# Patient Record
Sex: Female | Born: 1960 | Race: Black or African American | Hispanic: No | Smoking: Former smoker
Health system: Southern US, Community
[De-identification: ages and names within clinical notes are randomized; demographics above are authoritative.]

## PROBLEM LIST (undated history)

## (undated) DIAGNOSIS — R609 Edema, unspecified: Secondary | ICD-10-CM

## (undated) DIAGNOSIS — I10 Essential (primary) hypertension: Secondary | ICD-10-CM

## (undated) DIAGNOSIS — T7840XA Allergy, unspecified, initial encounter: Secondary | ICD-10-CM

## (undated) DIAGNOSIS — K219 Gastro-esophageal reflux disease without esophagitis: Secondary | ICD-10-CM

## (undated) DIAGNOSIS — D649 Anemia, unspecified: Secondary | ICD-10-CM

## (undated) DIAGNOSIS — L309 Dermatitis, unspecified: Secondary | ICD-10-CM

## (undated) HISTORY — DX: Essential (primary) hypertension: I10

## (undated) HISTORY — PX: TUBAL LIGATION: SHX77

## (undated) HISTORY — DX: Anemia, unspecified: D64.9

## (undated) HISTORY — DX: Edema, unspecified: R60.9

## (undated) HISTORY — DX: Dermatitis, unspecified: L30.9

## (undated) HISTORY — DX: Allergy, unspecified, initial encounter: T78.40XA

## (undated) HISTORY — DX: Gastro-esophageal reflux disease without esophagitis: K21.9

---

## 1997-02-09 ENCOUNTER — Inpatient Hospital Stay (HOSPITAL_COMMUNITY): Admission: AD | Admit: 1997-02-09 | Discharge: 1997-02-09 | Payer: Self-pay | Admitting: *Deleted

## 1997-03-17 ENCOUNTER — Ambulatory Visit (HOSPITAL_COMMUNITY): Admission: RE | Admit: 1997-03-17 | Discharge: 1997-03-17 | Payer: Self-pay | Admitting: *Deleted

## 1997-03-30 ENCOUNTER — Encounter (HOSPITAL_COMMUNITY): Admission: RE | Admit: 1997-03-30 | Discharge: 1997-05-20 | Payer: Self-pay | Admitting: *Deleted

## 1997-04-13 ENCOUNTER — Encounter: Admission: RE | Admit: 1997-04-13 | Discharge: 1997-04-13 | Payer: Self-pay | Admitting: Family Medicine

## 1997-04-29 ENCOUNTER — Encounter: Admission: RE | Admit: 1997-04-29 | Discharge: 1997-04-29 | Payer: Self-pay | Admitting: Sports Medicine

## 1997-05-13 ENCOUNTER — Encounter: Admission: RE | Admit: 1997-05-13 | Discharge: 1997-05-13 | Payer: Self-pay | Admitting: Family Medicine

## 1997-05-17 ENCOUNTER — Inpatient Hospital Stay (HOSPITAL_COMMUNITY): Admission: RE | Admit: 1997-05-17 | Discharge: 1997-05-17 | Payer: Self-pay | Admitting: Obstetrics & Gynecology

## 1997-05-18 ENCOUNTER — Inpatient Hospital Stay (HOSPITAL_COMMUNITY): Admission: AD | Admit: 1997-05-18 | Discharge: 1997-05-22 | Payer: Self-pay | Admitting: *Deleted

## 1997-05-25 ENCOUNTER — Inpatient Hospital Stay (HOSPITAL_COMMUNITY): Admission: AD | Admit: 1997-05-25 | Discharge: 1997-05-25 | Payer: Self-pay | Admitting: Obstetrics

## 1997-05-27 ENCOUNTER — Inpatient Hospital Stay (HOSPITAL_COMMUNITY): Admission: RE | Admit: 1997-05-27 | Discharge: 1997-05-27 | Payer: Self-pay | Admitting: Obstetrics & Gynecology

## 1997-07-02 ENCOUNTER — Encounter: Admission: RE | Admit: 1997-07-02 | Discharge: 1997-07-02 | Payer: Self-pay | Admitting: Family Medicine

## 1997-07-25 ENCOUNTER — Ambulatory Visit (HOSPITAL_COMMUNITY): Admission: RE | Admit: 1997-07-25 | Discharge: 1997-07-25 | Payer: Self-pay | Admitting: Emergency Medicine

## 1997-07-25 ENCOUNTER — Emergency Department (HOSPITAL_COMMUNITY): Admission: EM | Admit: 1997-07-25 | Discharge: 1997-07-25 | Payer: Self-pay | Admitting: Emergency Medicine

## 1998-10-06 ENCOUNTER — Other Ambulatory Visit: Admission: RE | Admit: 1998-10-06 | Discharge: 1998-10-06 | Payer: Self-pay | Admitting: Internal Medicine

## 1999-01-19 ENCOUNTER — Encounter: Payer: Self-pay | Admitting: Internal Medicine

## 1999-01-19 ENCOUNTER — Encounter: Admission: RE | Admit: 1999-01-19 | Discharge: 1999-01-19 | Payer: Self-pay | Admitting: Internal Medicine

## 1999-08-20 ENCOUNTER — Emergency Department (HOSPITAL_COMMUNITY): Admission: EM | Admit: 1999-08-20 | Discharge: 1999-08-20 | Payer: Self-pay | Admitting: Emergency Medicine

## 2000-05-12 ENCOUNTER — Encounter: Payer: Self-pay | Admitting: Emergency Medicine

## 2000-05-12 ENCOUNTER — Emergency Department (HOSPITAL_COMMUNITY): Admission: EM | Admit: 2000-05-12 | Discharge: 2000-05-12 | Payer: Self-pay | Admitting: Emergency Medicine

## 2000-09-19 ENCOUNTER — Encounter: Admission: RE | Admit: 2000-09-19 | Discharge: 2000-09-19 | Payer: Self-pay | Admitting: Internal Medicine

## 2000-09-19 ENCOUNTER — Encounter: Payer: Self-pay | Admitting: Internal Medicine

## 2000-09-26 ENCOUNTER — Other Ambulatory Visit: Admission: RE | Admit: 2000-09-26 | Discharge: 2000-09-26 | Payer: Self-pay | Admitting: Internal Medicine

## 2001-10-07 ENCOUNTER — Encounter: Admission: RE | Admit: 2001-10-07 | Discharge: 2001-10-07 | Payer: Self-pay | Admitting: Internal Medicine

## 2001-10-07 ENCOUNTER — Encounter: Payer: Self-pay | Admitting: Internal Medicine

## 2002-01-29 ENCOUNTER — Emergency Department (HOSPITAL_COMMUNITY): Admission: EM | Admit: 2002-01-29 | Discharge: 2002-01-29 | Payer: Self-pay | Admitting: Emergency Medicine

## 2002-01-29 ENCOUNTER — Encounter: Payer: Self-pay | Admitting: Emergency Medicine

## 2002-05-04 ENCOUNTER — Encounter: Payer: Self-pay | Admitting: *Deleted

## 2002-05-04 ENCOUNTER — Inpatient Hospital Stay (HOSPITAL_COMMUNITY): Admission: EM | Admit: 2002-05-04 | Discharge: 2002-05-05 | Payer: Self-pay | Admitting: Emergency Medicine

## 2002-05-19 ENCOUNTER — Ambulatory Visit (HOSPITAL_COMMUNITY): Admission: RE | Admit: 2002-05-19 | Discharge: 2002-05-19 | Payer: Self-pay | Admitting: *Deleted

## 2002-10-13 ENCOUNTER — Encounter: Admission: RE | Admit: 2002-10-13 | Discharge: 2002-10-13 | Payer: Self-pay | Admitting: Internal Medicine

## 2002-10-13 ENCOUNTER — Encounter: Payer: Self-pay | Admitting: Internal Medicine

## 2002-11-03 ENCOUNTER — Emergency Department (HOSPITAL_COMMUNITY): Admission: EM | Admit: 2002-11-03 | Discharge: 2002-11-03 | Payer: Self-pay | Admitting: Emergency Medicine

## 2003-11-05 ENCOUNTER — Ambulatory Visit (HOSPITAL_COMMUNITY): Admission: RE | Admit: 2003-11-05 | Discharge: 2003-11-05 | Payer: Self-pay | Admitting: Surgery

## 2004-07-28 ENCOUNTER — Ambulatory Visit (HOSPITAL_COMMUNITY): Admission: RE | Admit: 2004-07-28 | Discharge: 2004-07-28 | Payer: Self-pay | Admitting: *Deleted

## 2004-07-28 ENCOUNTER — Ambulatory Visit (HOSPITAL_BASED_OUTPATIENT_CLINIC_OR_DEPARTMENT_OTHER): Admission: RE | Admit: 2004-07-28 | Discharge: 2004-07-28 | Payer: Self-pay | Admitting: *Deleted

## 2005-01-18 ENCOUNTER — Ambulatory Visit (HOSPITAL_COMMUNITY): Admission: RE | Admit: 2005-01-18 | Discharge: 2005-01-18 | Payer: Self-pay | Admitting: Internal Medicine

## 2006-06-03 ENCOUNTER — Ambulatory Visit (HOSPITAL_COMMUNITY): Admission: RE | Admit: 2006-06-03 | Discharge: 2006-06-03 | Payer: Self-pay | Admitting: Orthopaedic Surgery

## 2006-07-03 ENCOUNTER — Ambulatory Visit (HOSPITAL_COMMUNITY): Admission: RE | Admit: 2006-07-03 | Discharge: 2006-07-03 | Payer: Self-pay | Admitting: Internal Medicine

## 2006-07-26 ENCOUNTER — Other Ambulatory Visit: Admission: RE | Admit: 2006-07-26 | Discharge: 2006-07-26 | Payer: Self-pay | Admitting: Internal Medicine

## 2006-09-06 ENCOUNTER — Encounter: Admission: RE | Admit: 2006-09-06 | Discharge: 2006-09-06 | Payer: Self-pay | Admitting: Internal Medicine

## 2007-01-08 ENCOUNTER — Encounter: Admission: RE | Admit: 2007-01-08 | Discharge: 2007-01-08 | Payer: Self-pay | Admitting: Internal Medicine

## 2007-12-31 ENCOUNTER — Ambulatory Visit (HOSPITAL_COMMUNITY): Admission: RE | Admit: 2007-12-31 | Discharge: 2007-12-31 | Payer: Self-pay | Admitting: Internal Medicine

## 2008-02-19 ENCOUNTER — Other Ambulatory Visit: Admission: RE | Admit: 2008-02-19 | Discharge: 2008-02-19 | Payer: Self-pay | Admitting: Internal Medicine

## 2008-02-19 ENCOUNTER — Ambulatory Visit: Payer: Self-pay | Admitting: Internal Medicine

## 2008-05-20 ENCOUNTER — Ambulatory Visit: Payer: Self-pay | Admitting: Internal Medicine

## 2008-07-01 ENCOUNTER — Ambulatory Visit: Payer: Self-pay | Admitting: Internal Medicine

## 2008-07-30 ENCOUNTER — Ambulatory Visit: Payer: Self-pay | Admitting: Internal Medicine

## 2008-09-16 ENCOUNTER — Ambulatory Visit: Payer: Self-pay | Admitting: Internal Medicine

## 2008-11-11 ENCOUNTER — Ambulatory Visit: Payer: Self-pay | Admitting: Internal Medicine

## 2009-01-19 ENCOUNTER — Ambulatory Visit (HOSPITAL_COMMUNITY): Admission: RE | Admit: 2009-01-19 | Discharge: 2009-01-19 | Payer: Self-pay | Admitting: Internal Medicine

## 2009-05-12 ENCOUNTER — Ambulatory Visit: Payer: Self-pay | Admitting: Internal Medicine

## 2010-01-12 ENCOUNTER — Ambulatory Visit
Admission: RE | Admit: 2010-01-12 | Discharge: 2010-01-12 | Payer: Self-pay | Source: Home / Self Care | Attending: Internal Medicine | Admitting: Internal Medicine

## 2010-01-12 ENCOUNTER — Other Ambulatory Visit
Admission: RE | Admit: 2010-01-12 | Discharge: 2010-01-12 | Payer: Self-pay | Source: Home / Self Care | Admitting: Internal Medicine

## 2010-01-21 ENCOUNTER — Encounter: Payer: Self-pay | Admitting: Internal Medicine

## 2010-01-21 ENCOUNTER — Encounter: Payer: Self-pay | Admitting: Surgery

## 2010-01-22 ENCOUNTER — Encounter: Payer: Self-pay | Admitting: Internal Medicine

## 2010-02-09 ENCOUNTER — Other Ambulatory Visit: Payer: Self-pay | Admitting: Internal Medicine

## 2010-02-09 DIAGNOSIS — Z1231 Encounter for screening mammogram for malignant neoplasm of breast: Secondary | ICD-10-CM

## 2010-02-16 ENCOUNTER — Ambulatory Visit (HOSPITAL_COMMUNITY)
Admission: RE | Admit: 2010-02-16 | Discharge: 2010-02-16 | Disposition: A | Discharge status: Home / Self Care | Payer: Commercial Managed Care - PPO | Source: Ambulatory Visit | Attending: Internal Medicine | Admitting: Internal Medicine

## 2010-02-16 DIAGNOSIS — Z1231 Encounter for screening mammogram for malignant neoplasm of breast: Secondary | ICD-10-CM | POA: Insufficient documentation

## 2010-05-16 NOTE — Op Note (Signed)
[name], [name]               ACCOUNT NO.:  [id number]   MEDICAL RECORD NO.:  [id number]          PATIENT TYPE:  AMB   LOCATION:  SDS                          FACILITY:  [facility]   PHYSICIAN:  Vanita Panda. Magnus Ivan, M.D.DATE OF BIRTH:  [birth year]   DATE OF PROCEDURE:  06/03/2006  DATE OF DISCHARGE:                               OPERATIVE REPORT   PREOPERATIVE DIAGNOSIS:  Retained foreign object left thumb (fish barb).   POSTOPERATIVE DIAGNOSIS:  Retained foreign object left thumb (fish  barb).   PROCEDURE:  Left thumb exploration with removal of foreign object (fish  barb).   SURGEON:  Doneen Poisson, MD   ANESTHESIA:  IV sedation and local digital block left thumb with 4%  plain Marcaine.   ANTIBIOTICS:  1 gram IV Ancef.   BLOOD LOSS:  Minimal.   COMPLICATIONS:  None.   INDICATIONS:  Briefly 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  She was seen [facility] and they  tried to remove the barb from her thumb but were not successful.  X-rays  did show a retained foreign object in the thumb.  She was given follow-  up in her office and she understood the need for surgery and the risks,  benefits of this were explained her and well understood and she agreed  to the OR.   PROCEDURE DESCRIPTION:  After informed consent was obtained appropriate  left thumb was marked, she was brought to the operating room, placed  supine on the operating table.  Her hand was prepped and draped with  DuraPrep and sterile drapes.  A time-out was called and she was  identified as correct patient correct extremity.  She was again given  some slight IV sedation but I placed a digital nerve block using 0.25%  plain Marcaine with she tolerated well.  I then opened up the incision  and removed the previous sutures.  Using hemostat I was able to easily  feel the fish barb and  removed it in its entirety and showed it to the  patient who was wide awake and she saw this understand and agreed this  was the barb that broke off.  I then irrigated the wound thoroughly with  normal saline solution.  I closed the incision with interrupted 3-0  nylon suture.  The Xeroform followed by well-padded sterile dressing was  applied.  The thumb remained well-perfused throughout the case.  She was  taken to recovery room in  stable condition.  Postoperatively I will have her follow-up in office  in 2 weeks.  She will change her dressing tomorrow and can use the thumb  as tolerated with daily dressing changes.  She will continue antibiotics  that she is on for two more days and I will have her call if there is  any other questions or concerns. June 03, 2006      Vanita Panda. Magnus Ivan, M.D.  Electronically Signed     CYB/MEDQ  D:  06/03/2006  T:  06/04/2006  Job:  [id number]

## 2010-05-19 NOTE — Cardiovascular Report (Signed)
NAME:  Debra Jacobson, Debra Jacobson                         ACCOUNT NO.:  [id number]   MEDICAL RECORD NO.:  [id number]                   PATIENT TYPE:  OIB   LOCATION:  2899                                 FACILITY:  [facility]   PHYSICIAN:  Richard A. Alanda Amass, M.D.          DATE OF BIRTH:  [birth year]   DATE OF PROCEDURE:  05/19/2002  DATE OF DISCHARGE:  05/19/2002                              CARDIAC CATHETERIZATION   PROCEDURE:  Retrograde central aortic catheterization, selective coronary  angiography via Judkins technique, LV angiogram, RAO/LAO projection,  abdominal angiogram hand injection.   DESCRIPTION OF PROCEDURE:  The patient was brought to second floor CP lab in  a postabsorptive state after 5 mg of Valium p.o. premedication.  Next, was  given  2 mg of Versed and 2 mg of Nubain for sedation IV at the beginning of the  procedure.  1% Xylocaine was used for local anesthesia.  The CRFA was  entered with single atrial puncture using an 18 thin wall needle and a 6  French short __________ sheaths were inserted without difficulty.  Catheterization was performed with 6 French 4-cm tapered preformed coronary  pigtail catheter using Omnipaque dye throughout the procedure.  There was  separate ostia to the LAD and circumflex which were selectively catheterized  with the left coronary catheter.  Right coronary angiography was then done  followed by LV angiogram in the RAO and LAO projection, 25 mL; 14 mL per  second and 20 mL; 12 mL per second.  Pullback pressure of this area was  performed which showed no gradient across the aortic valve.  Hand injections  of abdominal aorta at the level just above the renal artery was done in the  PA projection.  This demonstrated normal widely patent single renal arteries  with normal infrarenal aorta just below on limited injection.  There was no  evidence of renal stenosis or FMD.   Catheters removed.  __________ sheath was flushed.  The patient was  transferred to the holding area for sheath removal with pressure hemostasis.  She tolerated the procedure well.   PRESSURES:  1. LV:  140/0; LVDP 18 mmHg.  2. CA:  140/80-85 mmHg.  3. There was no gradient across the aortic valve on catheter pullback.   Fluoroscopy did not reveal any coronary, intracoronary, valvular  calcification.   LV angiogram demonstrated a normally contracting ventricle with EF greater  than 60%, possible mild angiographic LVH, no mitral valve prolapse or mitral  regurgitation was seen.  No wall motion abnormalities.   The LAD and circumflex had separate ostia.   The LAD was widely patent with a bifurcating moderate-size DX1 in the  proximal before SP1 and the proximal third of the LAD.  There was then a  large septal perforator and then a large DX2 from the junction of the  proximal mid third of the LAD was normal.  The LAD had a loop in its  mid  portion and there may have been some muscular bridging in this area.  However, there was no systolic compression evident with normal filling down  to the apex.   The circumflex had a separate ostium comprised of a small first marginal, a  large second marginal that bifurcated and a PAVG and atrial branch; all of  which were normal.   The right coronary was a large dominant vessel with normal PDA, PLA and RV  branches.  There was no evidence of coronary spasm or atherosclerotic  disease present in either the left or right coronary system.   DISCUSSION:  This 50 year old divorced and remarried mother of three is a  nonsmoker.  She does have a history of prior ETOH.  She works at [employer] and has a history of exogenous obesity with a norma lipid profile.  She was recently admitted to [facility] May 3 to May 4 for atypical chest pain.  Myocardial infarction was ruled out by serial enzymes and EKGs.  She did  have elevated CK, but negative MBs and troponins.  She was hypokalemic which  was repleted during her  hospital stay which may have contributed to her CK  elevation.  She was seen in followup by Dr. Lenise Herald and a Cardiolite  was performed.  This demonstrated moderate diaphragmatic attenuation and  possible anterior wall ischemia.  It was difficult to interpret because of  the patient's exogenous obesity and large breasts.  For this reason,  diagnostic catheterization was recommended.   Fortunately, catheterization shows entirely normal coronary arteries and  left ventricle.  There is some tortuosity of the mid LAD with possible  intramyocardial portion of the mid LAD.  However, there was no systolic  bridging, milking or spasm.   I would recommend reassurance to this patient.  Continue empiric proton pump  inhibitors and therapy of her mild hypertension with normal renal arteries.   Obviously weight reduction is recommended along with an exercise program.   CATHETERIZATION DIAGNOSES:  1. Chest pain, etiology not determined.  False-positive Cardiolite related     to large size.  2. Exogenous obesity.  3. Normal coronary arteries and left ventricle.  Separate ostia, left     anterior descending and circumflex.  4. Mild hypertension.  5. Gastroesophageal reflux disease.                                               Richard A. Alanda Amass, M.D.    RAW/MEDQ  D:  05/19/2002  T:  05/20/2002  Job:  [id number]   cc:   Luanna Cole. Lenord Fellers, M.D.  [address] [place]  [place]  [place] [address]  Fax: [phone]   Record Room   Darlin Priestly, M.D.  [phone] [address], [address]  [place]  [place] [address]  Fax: [phone]

## 2010-05-19 NOTE — Op Note (Signed)
Debra, Jacobson               ACCOUNT NO.:  [id number]   MEDICAL RECORD NO.:  [id number]          PATIENT TYPE:  AMB   LOCATION:  DSC                          FACILITY:  [facility]   PHYSICIAN:  Tennis Must Meyerdierks, M.D.DATE OF BIRTH:  Feb 02, 1960   DATE OF PROCEDURE:  07/28/2004  DATE OF DISCHARGE:                                 OPERATIVE REPORT   PREOPERATIVE DIAGNOSIS:  Lacerated radial digital artery and nerve, left  index finger.   POSTOPERATIVE DIAGNOSIS:  Lacerated radial digital artery and nerve, left  index finger.   PROCEDURE:  Repair of radial digital artery and nerve, left index finger.   SURGEON:  Lowell Bouton, M.D.   ANESTHESIA:  General.   OPERATIVE FINDINGS:  The patient had a complete transection of both the  digital artery and nerve on the radial side of the index finger at the MP  level. The flexor tendons were intact.   PROCEDURE:  Under general anesthesia, with a tourniquet on the left arm, the  left hand was prepped and draped in the usual fashion and after explaining  the lamina tourniquet was inflated to 250 mmHg. The previous sutures were  removed and the wound was spread open bluntly. It was then extended slightly  distally out onto the finger. Flap was elevated and a Alm retractor was  inserted. Blunt dissection was carried down to the nerve.  Both the artery  and nerve were identified. The microscope was then brought into the field  and the nerve was dissected out. Both ends of the nerve were easily  identified and the ends of the nerve were trimmed back to remove any  hemorrhage. After good fascicles were obtained through the microscope, 9-0  nylon suture was used to perform an epineurial repair. Approximately five  sutures were inserted around the nerve. There was good coaptation of the  ends of the nerve.  The ends of the artery were dissected out and Dora Sims  solution was used to dilate the ends of the artery. A vessel dilator was  also used and after appropriate dilatation a small vascular clamp was  applied. The artery was then repaired using a 10-0 nylon suture in an  interrupted fashion. After the arterial repair, the tourniquet was released  with good flow through the vessel. __________ nylon sutures. Sterile  dressings were applied followed by a posterior splint with the MP joint  slightly flexed.  The patient had good circulation her hand. She went to  recovery room awake and stable in good condition. Some 0.5% Marcaine was  inserted in the skin edges for pain control postoperatively.       EMM/MEDQ  D:  07/28/2004  T:  07/28/2004  Job:  [id number]

## 2010-05-19 NOTE — Discharge Summary (Signed)
NAME:  Debra Jacobson, Debra Jacobson                         ACCOUNT NO.:  [id number]   MEDICAL RECORD NO.:  [id number]                   PATIENT TYPE:  INP   LOCATION:  6529                                 FACILITY:  [facility]   PHYSICIAN:  Darlin Priestly, M.D.             DATE OF BIRTH:  June 29, 1960   DATE OF ADMISSION:  05/04/2002  DATE OF DISCHARGE:  05/05/2002                                 DISCHARGE SUMMARY   DISCHARGE DIAGNOSES:  1. Chest pain on admission, ruled out for myocardial infarction by EKG and     negative troponins.  2. Possible underlying coronary artery disease.  Will proceed with     Cardiolite stress test as an outpatient.  3. Gastroesophageal reflux disease.  4. History of ethanol abuse.  5. Obesity with unknown lipid profile.  6. Hypokalemia, repleted.   HISTORY OF PRESENT ILLNESS:  This is a 50 year old African American female  with a history of obesity and positive ethanol use who developed substernal  chest pain on and off for months.  Yesterday, the patient noticed that the  pain became worse and she was unable to obtain a good night's sleep so she  presented to the emergency room where her cardiac enzymes were checked and  the first elevated reading was reported to Dr. Lenord Fellers and she recommended  the patient would be evaluated by [facility]  M.D.   Mr. Jenne Campus assessed the patient and admitted her for overnight observation  and 2-D echo.   MEDICATION AT HOME:  Nexium started.   ALLERGIES:  No known drug allergies.   FAMILY HISTORY:  No significant coronary artery disease in the family.  Mother died of sarcoidosis.  The father has a permanent pacemaker.   SOCIAL HISTORY:  She is married and has three children.  She is employed at  [employer]. [employer].   HABITS:  She does not smoke but does have a history of extensive ethanol use  up to one half liter of liquor daily.   HOSPITAL COURSE:  Her EKG on admission did not  show any significant STT wave  changes.  It showed normal sinus rhythm.   The morning of discharge, she complained of epigastric discomfort and we  checked amylase which was within normal limits.  Her cardiac enzymes were as  follows.  The first set showed CK 656, CK-MB 6.8, troponin 0.02; second set  with CK 615, CK-MB 6.7, troponin 0.01; third set with CK 496, CK-MB 4.9,  troponin 0.01.  Her hemoglobin on the morning of discharge was 12.0.  Potassium was 3.4 and it was repleted prior to discharge.  BUN 14,  creatinine 0.9.  Glucose 105.   Dr. Allyson Sabal assessed the patient prior to discharge and he canceled the 2-D  echo order but decided to proceed with outpatient two day staged Cardiolite  and then follow up as an outpatient wit Dr.  McQueen.   DISCHARGE MEDICATIONS:  1. Metoprolol 12.5 mg one p.o. b.i.d.  2. Multivitamin daily.  3. Folic acid 1 mg one pill daily.  4. Enteric coated aspirin 325 mg daily.  5. Protonix 40 mg daily.   ACTIVITY:  As tolerated without exertion.   DIET:  Avoid spicy food and alcohol.   FOLLOW UP:  On May 13, 2002, and May 14, 2002, the patient will have a two  day protocol stress test at 8:15 in the morning and then Dr. Jenne Campus will  see her on May 15, 2002, at 10:45.  Phone number of our office was provided  and address as well.     Raymon Mutton, P.A.                    Darlin Priestly, M.D.    MK/MEDQ  D:  05/05/2002  T:  05/06/2002  Job:  [id number]   cc:   Luanna Cole. Lenord Fellers, M.D.  [address] [place]  [place]  [place] [address]  Fax: [phone]

## 2010-07-13 ENCOUNTER — Ambulatory Visit: Payer: Commercial Managed Care - PPO | Admitting: Internal Medicine

## 2010-07-14 ENCOUNTER — Ambulatory Visit: Payer: Commercial Managed Care - PPO | Admitting: Internal Medicine

## 2010-08-16 ENCOUNTER — Other Ambulatory Visit: Payer: Self-pay | Admitting: Internal Medicine

## 2010-08-21 ENCOUNTER — Other Ambulatory Visit: Payer: Self-pay | Admitting: Internal Medicine

## 2010-08-28 ENCOUNTER — Ambulatory Visit: Payer: Commercial Managed Care - PPO | Admitting: Internal Medicine

## 2010-08-29 ENCOUNTER — Ambulatory Visit (INDEPENDENT_AMBULATORY_CARE_PROVIDER_SITE_OTHER): Payer: Commercial Managed Care - PPO | Admitting: Internal Medicine

## 2010-08-29 ENCOUNTER — Encounter: Payer: Self-pay | Admitting: Internal Medicine

## 2010-08-29 VITALS — BP 118/72 | HR 78 | Temp 98.2°F | Ht 68.0 in | Wt 292.0 lb

## 2010-08-29 DIAGNOSIS — E669 Obesity, unspecified: Secondary | ICD-10-CM

## 2010-08-29 DIAGNOSIS — R609 Edema, unspecified: Secondary | ICD-10-CM

## 2010-08-29 DIAGNOSIS — I10 Essential (primary) hypertension: Secondary | ICD-10-CM

## 2010-08-30 ENCOUNTER — Encounter: Payer: Self-pay | Admitting: Internal Medicine

## 2010-08-30 NOTE — Progress Notes (Signed)
  Subjective:    Patient ID: Debra Jacobson, female    DOB: 1960-07-24, 50 y.o.   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 No complaints or problems. Is in an unhappy marriage. Husband is disabled but recently told her he was willing to move out of she would give him $200. However he is still living in the home. They have 2 children both of whom are morbidly obese. She is also helping to raise her grandchild because her son from another relationship is in jail for armed robbery. Recently she had a vacation to the [place] and enjoyed herself. In the past has had some issues with alcohol consumption at night.    Review of Systems     Objective:   Physical Exam neck is supple without JVD, thyromegaly or carotid bruits; chest is clear; cardiac exam regular rate and rhythm, normal S1 and S2; extremities without pitting edema although she does have some edema issues treated with Lasix, she does not take the Lasix on a daily basis        Assessment & Plan:  Hypertension  Obesity  Dependent edema  Refill antihypertensive medications for 6 months if pharmacy calls. Book physical examination late winter or spring 2013

## 2010-08-31 ENCOUNTER — Ambulatory Visit: Payer: Commercial Managed Care - PPO | Admitting: Internal Medicine

## 2010-09-18 ENCOUNTER — Other Ambulatory Visit: Payer: Self-pay | Admitting: Internal Medicine

## 2010-10-19 LAB — CBC
HCT: 36.7
Hemoglobin: 12.5
MCHC: 34
MCV: 95.1
Platelets: 216
RBC: 3.86 — ABNORMAL LOW
RDW: 13.6
WBC: 7.9

## 2011-01-02 HISTORY — PX: GASTRIC BYPASS: SHX52

## 2011-02-15 ENCOUNTER — Other Ambulatory Visit: Payer: Self-pay | Admitting: Internal Medicine

## 2011-02-26 ENCOUNTER — Other Ambulatory Visit: Payer: Self-pay | Admitting: Internal Medicine

## 2011-02-26 DIAGNOSIS — Z1231 Encounter for screening mammogram for malignant neoplasm of breast: Secondary | ICD-10-CM

## 2011-02-27 ENCOUNTER — Other Ambulatory Visit: Payer: Commercial Managed Care - PPO | Admitting: Internal Medicine

## 2011-02-28 ENCOUNTER — Other Ambulatory Visit: Payer: Commercial Managed Care - PPO | Admitting: Internal Medicine

## 2011-03-01 ENCOUNTER — Ambulatory Visit (INDEPENDENT_AMBULATORY_CARE_PROVIDER_SITE_OTHER): Payer: Commercial Managed Care - PPO | Admitting: Internal Medicine

## 2011-03-01 ENCOUNTER — Encounter: Payer: Self-pay | Admitting: Internal Medicine

## 2011-03-01 VITALS — BP 110/82 | HR 72 | Ht 66.5 in | Wt 298.0 lb

## 2011-03-01 DIAGNOSIS — E669 Obesity, unspecified: Secondary | ICD-10-CM

## 2011-03-01 DIAGNOSIS — I10 Essential (primary) hypertension: Secondary | ICD-10-CM

## 2011-03-01 DIAGNOSIS — Z0000 Encounter for general adult medical examination without abnormal findings: Secondary | ICD-10-CM

## 2011-03-01 LAB — CBC WITH DIFFERENTIAL/PLATELET
Basophils Absolute: 0 10*3/uL (ref 0.0–0.1)
Basophils Relative: 0 % (ref 0–1)
Eosinophils Absolute: 0.2 10*3/uL (ref 0.0–0.7)
Eosinophils Relative: 3 % (ref 0–5)
HCT: 37.4 % (ref 36.0–46.0)
Hemoglobin: 11.8 g/dL — ABNORMAL LOW (ref 12.0–15.0)
Lymphocytes Relative: 38 % (ref 12–46)
Lymphs Abs: 2.3 10*3/uL (ref 0.7–4.0)
MCH: 31.8 pg (ref 26.0–34.0)
MCHC: 31.6 g/dL (ref 30.0–36.0)
MCV: 100.8 fL — ABNORMAL HIGH (ref 78.0–100.0)
Monocytes Absolute: 0.4 10*3/uL (ref 0.1–1.0)
Monocytes Relative: 7 % (ref 3–12)
Neutro Abs: 3 10*3/uL (ref 1.7–7.7)
Neutrophils Relative %: 52 % (ref 43–77)
Platelets: 159 10*3/uL (ref 150–400)
RBC: 3.71 MIL/uL — ABNORMAL LOW (ref 3.87–5.11)
RDW: 13.6 % (ref 11.5–15.5)
WBC: 5.9 10*3/uL (ref 4.0–10.5)

## 2011-03-01 LAB — LIPID PANEL
Cholesterol: 192 mg/dL (ref 0–200)
HDL: 75 mg/dL (ref >39)
LDL Cholesterol: 100 mg/dL — ABNORMAL HIGH (ref 0–99)
Total CHOL/HDL Ratio: 2.6 Ratio
Triglycerides: 84 mg/dL (ref <150)
VLDL: 17 mg/dL (ref 0–40)

## 2011-03-01 LAB — POCT URINALYSIS DIPSTICK
Bilirubin, UA: NEGATIVE
Blood, UA: NEGATIVE
Glucose, UA: NEGATIVE
Ketones, UA: NEGATIVE
Leukocytes, UA: NEGATIVE
Nitrite, UA: NEGATIVE
Protein, UA: NEGATIVE
Spec Grav, UA: >=1.03
Urobilinogen, UA: NEGATIVE
pH, UA: 5.5

## 2011-03-01 LAB — COMPREHENSIVE METABOLIC PANEL
ALT: 69 U/L — ABNORMAL HIGH (ref 0–35)
AST: 76 U/L — ABNORMAL HIGH (ref 0–37)
Albumin: 3.7 g/dL (ref 3.5–5.2)
Alkaline Phosphatase: 58 U/L (ref 39–117)
BUN: 18 mg/dL (ref 6–23)
CO2: 23 mEq/L (ref 19–32)
Calcium: 8.9 mg/dL (ref 8.4–10.5)
Chloride: 105 mEq/L (ref 96–112)
Creat: 0.9 mg/dL (ref 0.50–1.10)
Glucose, Bld: 88 mg/dL (ref 70–99)
Potassium: 4.3 mEq/L (ref 3.5–5.3)
Sodium: 137 mEq/L (ref 135–145)
Total Bilirubin: 0.4 mg/dL (ref 0.3–1.2)
Total Protein: 7.9 g/dL (ref 6.0–8.3)

## 2011-03-01 LAB — TSH: TSH: 0.658 u[IU]/mL (ref 0.350–4.500)

## 2011-03-01 NOTE — Progress Notes (Signed)
Subjective:    Patient ID: Debra Jacobson, female    DOB: 09/07/1960, 51 y.o.   MRN: [id number]  HPI 51 year old black female with history of hypertension and obesity for health maintenance and evaluation of medical problems. Fasting labs drawn today and are pending. Patient has not had screening colonoscopy. She will consider it. Last Pap smear was 01/13/2010. 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 Had tetanus immunization 2005. No known drug allergies. Had negative cardiac catheterization may 2004. Left index finger laceration 2005.  History of 2 C-sections. Tubal ligation May 1999. Ectopic pregnancy and uterine fibroid surgery 1995.  Patient is married. Husband is disabled. She and husband do not get along very well. They have 2 children. She has a son from a previous relationship that has been in jail. She has one grandchild. Patient drinks alcohol on a daily basis. Does not smoke.  Mother died from complications of sarcoidosis with history of hypertension. 3 sisters& one of them has sarcoidosis. No brothers.  Patient has been a patient here in this practice since 1999. 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 Patient weighed 309 pounds in March 2000. She has had a total of 8 pregnancies& 5 miscarriages.    Review of Systems  Constitutional: Positive for fatigue.  HENT: Negative.   Eyes: Negative.   Cardiovascular: Negative.   Gastrointestinal: Negative.   Genitourinary: Negative.   Musculoskeletal: Positive for back pain.  Neurological: Negative.   Hematological: Negative.   Psychiatric/Behavioral: Negative.        Objective:   Physical Exam  Vitals reviewed. Constitutional: She is oriented to person, place, and time. She appears well-developed and well-nourished. No distress.  HENT:  Head: Normocephalic and atraumatic.  Right Ear: External ear  normal.  Left Ear: External ear normal.  Mouth/Throat: Oropharynx is clear and moist. No oropharyngeal exudate.  Eyes: Conjunctivae and EOM are normal. Pupils are equal, round, and reactive to light. Right eye exhibits no discharge. Left eye exhibits no discharge. No scleral icterus.  Neck: Neck supple. No JVD present. No thyromegaly present.  Cardiovascular: Normal rate, regular rhythm, normal heart sounds and intact distal pulses.   No murmur heard. Pulmonary/Chest: Effort normal and breath sounds normal. She has no wheezes. She has no rales.       Breasts are pendulous without masses or nipple discharges  Abdominal: Soft. Bowel sounds are normal. She exhibits no mass. There is no tenderness. There is no rebound.  Genitourinary:       Last Pap smear 01/13/2010. Bimanual exam is normal.  Musculoskeletal: She exhibits no edema.  Lymphadenopathy:    She has no cervical adenopathy.  Neurological: She is alert and oriented to person, place, and time. She has normal reflexes. She displays normal reflexes. No cranial nerve deficit. Coordination normal.  Skin: Skin is warm and dry. She is not diaphoretic.       Bilateral tinea pedis  Psychiatric: She has a normal mood and affect. Her behavior is normal. Judgment and thought content normal.          Assessment & Plan:  Hypertension  Morbid obesity  History of GE reflux  Allergic rhinitis  History of dependent edema  Tinea pedis  Plan: Patient may use over-the-counter antifungal cream for tinea pedis. She is considering having bariatric surgery at [facility] in the near future. Continue same antihypertensive medication Norvasc and metoprolol as  well as Lasix. Continue one aspirin daily. Return in 6 months for office visit blood pressure check. Patient to give consideration to colonoscopy.

## 2011-03-01 NOTE — Patient Instructions (Signed)
Continue same antihypertensive medications. Return in 6 months for office visit blood pressure check.

## 2011-03-02 LAB — VITAMIN D 25 HYDROXY (VIT D DEFICIENCY, FRACTURES): Vit D, 25-Hydroxy: 45 ng/mL (ref 30–89)

## 2011-03-15 ENCOUNTER — Other Ambulatory Visit: Payer: Self-pay | Admitting: Internal Medicine

## 2011-03-27 ENCOUNTER — Ambulatory Visit (HOSPITAL_COMMUNITY)
Admission: RE | Admit: 2011-03-27 | Discharge: 2011-03-27 | Disposition: A | Discharge status: Home / Self Care | Payer: Commercial Managed Care - PPO | Source: Ambulatory Visit | Attending: Internal Medicine | Admitting: Internal Medicine

## 2011-03-27 DIAGNOSIS — Z1231 Encounter for screening mammogram for malignant neoplasm of breast: Secondary | ICD-10-CM | POA: Insufficient documentation

## 2011-03-29 ENCOUNTER — Telehealth: Payer: Self-pay | Admitting: Internal Medicine

## 2011-03-29 NOTE — Telephone Encounter (Signed)
I will call patient

## 2011-03-29 NOTE — Telephone Encounter (Signed)
Patient called yesterday and said that on her physical exam February 28 said I stated she drank alcohol on a daily basis. She told front office staff she has not had anything to drink now for over a month. That would be about the time of her physical exam. I called [facility] and spoke with Sherron Monday today who is the Bariatric Coordinator. It seems that patient was scheduled to have bariatric surgery on April 2 and it was canceled because Dr. Adolphus Birchwood, surgeon wanted patient to see psychologist prior to undergoing surgery. She has been in a hurry to get surgery done. Apparently she has told coordinator that her children are out of school next week. She has been rescheduled to see surgeon April 8. She is seeing psychologist today at noon. Ms. Ninetta Lights recommends that we have patient come back in and discuss these issues. She apparently did not want to go to a seminar prior to surgery but Dr. Adolphus Birchwood insisted she attend which she did.. According to bariatric coordinator, She has been pushy in terms of getting surgery scheduled and a bit passive-aggressive about filling out required paperwork. A psychology evaluation is probably a good idea at this point. Bariatric Coordinator indicates that consuming alcohol is of concern to surgeon and something that they do not take lightly and generally send patient is to be evaluated for.

## 2011-05-07 ENCOUNTER — Encounter: Payer: Self-pay | Admitting: Internal Medicine

## 2011-05-07 ENCOUNTER — Ambulatory Visit (INDEPENDENT_AMBULATORY_CARE_PROVIDER_SITE_OTHER): Payer: Commercial Managed Care - PPO | Admitting: Internal Medicine

## 2011-05-07 VITALS — BP 104/76 | HR 68 | Temp 98.0°F | Ht 66.75 in | Wt 282.0 lb

## 2011-05-07 DIAGNOSIS — Z9884 Bariatric surgery status: Secondary | ICD-10-CM

## 2011-05-07 DIAGNOSIS — K219 Gastro-esophageal reflux disease without esophagitis: Secondary | ICD-10-CM

## 2011-05-07 DIAGNOSIS — Z8639 Personal history of other endocrine, nutritional and metabolic disease: Secondary | ICD-10-CM

## 2011-05-07 DIAGNOSIS — I10 Essential (primary) hypertension: Secondary | ICD-10-CM

## 2011-05-07 DIAGNOSIS — E669 Obesity, unspecified: Secondary | ICD-10-CM

## 2011-05-07 LAB — COMPREHENSIVE METABOLIC PANEL
AST: 34 U/L (ref 0–37)
Albumin: 3.7 g/dL (ref 3.5–5.2)
Alkaline Phosphatase: 42 U/L (ref 39–117)
BUN: 12 mg/dL (ref 6–23)
Potassium: 3.9 mEq/L (ref 3.5–5.3)

## 2011-05-07 LAB — CBC WITH DIFFERENTIAL/PLATELET
Basophils Absolute: 0 10*3/uL (ref 0.0–0.1)
Basophils Relative: 0 % (ref 0–1)
MCHC: 30.8 g/dL (ref 30.0–36.0)
Monocytes Absolute: 0.4 10*3/uL (ref 0.1–1.0)
Neutro Abs: 2.1 10*3/uL (ref 1.7–7.7)
Neutrophils Relative %: 47 % (ref 43–77)
RDW: 14.3 % (ref 11.5–15.5)

## 2011-05-07 NOTE — Patient Instructions (Signed)
Continue to monitor your blood pressure off antihypertensive medication. Return in 4 months. Continue vitamin supplements as prescribed by surgeon.

## 2011-05-07 NOTE — Progress Notes (Signed)
  Subjective:    Patient ID: Debra Jacobson, female    DOB: Dec 12, 1960, 51 y.o.   MRN: [id number]  HPI patient is status post bariatric surgery at [facility] by Dr. Adolphus Birchwood. Surgery was done April 16. She did well postoperatively. 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 The operation that she had was sleeve gastric bypass. Patient had long-standing history of obesity. In March 2013 she weighed 298 pounds. She has a history of hypertension GE reflux and dependent edema. Had been maintained on metoprolol, furosemide, amlodipine and vitamin D. She is now off all antihypertensive medications. Blood pressure is excellent at the present time.  No known drug allergies  C-section x2, history of bilateral tubal ligation, history of surgery for ectopic pregnancy and uterine fibroids 1995.  Negative cardiac catheterization after admission for chest pain may 2004.  Tetanus immunization 2005.  History of vitamin D deficiency.  Currently taking Actonel, Pepto-Bismol, multivitamin and TUMS.    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 Adolphus Birchwood. We have drawn today a CBC, C. met and TSH which will be sent to Dr. Adolphus Birchwood. Return in 4-6 months. Stay off antihypertensive medication but continue to monitor blood pressure at home

## 2011-05-26 ENCOUNTER — Other Ambulatory Visit: Payer: Self-pay | Admitting: Internal Medicine

## 2011-05-30 ENCOUNTER — Encounter: Payer: Self-pay | Admitting: Internal Medicine

## 2011-05-30 DIAGNOSIS — Z9884 Bariatric surgery status: Secondary | ICD-10-CM | POA: Insufficient documentation

## 2011-08-30 ENCOUNTER — Ambulatory Visit: Payer: Commercial Managed Care - PPO | Admitting: Internal Medicine

## 2011-09-10 ENCOUNTER — Ambulatory Visit (INDEPENDENT_AMBULATORY_CARE_PROVIDER_SITE_OTHER): Payer: Commercial Managed Care - PPO | Admitting: Internal Medicine

## 2011-09-10 ENCOUNTER — Encounter: Payer: Self-pay | Admitting: Internal Medicine

## 2011-09-10 VITALS — BP 108/80 | HR 58 | Temp 97.9°F | Wt 231.0 lb

## 2011-09-10 DIAGNOSIS — Z9884 Bariatric surgery status: Secondary | ICD-10-CM

## 2011-09-11 LAB — COMPREHENSIVE METABOLIC PANEL
ALT: 19 U/L (ref 0–35)
Calcium: 9.4 mg/dL (ref 8.4–10.5)
Potassium: 4.1 mEq/L (ref 3.5–5.3)
Sodium: 141 mEq/L (ref 135–145)
Total Bilirubin: 0.4 mg/dL (ref 0.3–1.2)

## 2011-09-11 LAB — CBC WITH DIFFERENTIAL/PLATELET
Eosinophils Absolute: 0.1 10*3/uL (ref 0.0–0.7)
Eosinophils Relative: 2 % (ref 0–5)
Hemoglobin: 11 g/dL — ABNORMAL LOW (ref 12.0–15.0)
Lymphs Abs: 2.4 10*3/uL (ref 0.7–4.0)
MCH: 30.8 pg (ref 26.0–34.0)
MCV: 90.8 fL (ref 78.0–100.0)
Monocytes Absolute: 0.4 10*3/uL (ref 0.1–1.0)
Monocytes Relative: 6 % (ref 3–12)
Platelets: 139 10*3/uL — ABNORMAL LOW (ref 150–400)
RBC: 3.57 MIL/uL — ABNORMAL LOW (ref 3.87–5.11)

## 2011-09-11 LAB — HEMOGLOBIN A1C
Hgb A1c MFr Bld: 5.4 %
Mean Plasma Glucose: 108 mg/dL

## 2011-09-11 LAB — IRON AND TIBC
%SAT: 21 % (ref 20–55)
TIBC: 297 ug/dL (ref 250–470)
UIBC: 234 ug/dL (ref 125–400)

## 2011-09-30 NOTE — Patient Instructions (Addendum)
Stay off of antihypertensive medication and return spring 2014

## 2011-09-30 NOTE — Progress Notes (Signed)
  Subjective:    Patient ID: Debra Jacobson, female    DOB: 06-22-1960, 51 y.o.   MRN: [id number]  HPI 51 year old black female status post gastric bypass surgery in today for followup. She has completely come off antihypertensive medication. Blood pressure has been excellent. Her surgery was April 16. In March 2013 she weighed 298 pounds. In may she weighed 282 pounds. Previously was maintained on metoprolol, furosemide and amlodipine.    Review of Systems     Objective:   Physical Exam neck is supple without thyromegaly. Chest clear to auscultation. Cardiac exam regular rate and rhythm normal S1 and S2. Extremities without edema        Assessment & Plan:  Status post gastric bypass surgery by Dr. Adolphus Birchwood in [facility] April 2013. Maximum weight was 298 pounds in March 2013 and is now 231 pounds representing a 67 pound weight loss. She feels well and looks great. Suggest patient return in spring 2014.

## 2011-10-26 ENCOUNTER — Other Ambulatory Visit: Payer: Commercial Managed Care - PPO | Admitting: Internal Medicine

## 2011-10-26 DIAGNOSIS — Z9884 Bariatric surgery status: Secondary | ICD-10-CM

## 2011-10-26 LAB — HEMOGLOBIN A1C: Hgb A1c MFr Bld: 5.5 % (ref <5.7)

## 2011-10-26 LAB — FERRITIN: Ferritin: 67 ng/mL (ref 10–291)

## 2011-10-30 LAB — VITAMIN B1: Vitamin B1 (Thiamine): 13 nmol/L (ref 8–30)

## 2011-11-07 ENCOUNTER — Telehealth: Payer: Self-pay | Admitting: Internal Medicine

## 2011-11-07 NOTE — Telephone Encounter (Signed)
Patient faxed an employment form here for completion for job with [employer]. This will be a second job. Continues to work at [employer]. Says surgeon has released her status post gastric bypass surgery and she has no restrictions. Says she has had recent TB testing through [facility]. Explained to her that in the future this type of form completion  would require an office visit. We will complete form. Copy has been scanned in Epic and faxed to [employer] as she requested

## 2011-12-07 ENCOUNTER — Ambulatory Visit (INDEPENDENT_AMBULATORY_CARE_PROVIDER_SITE_OTHER): Payer: Commercial Managed Care - PPO | Admitting: Internal Medicine

## 2011-12-07 ENCOUNTER — Encounter: Payer: Self-pay | Admitting: Internal Medicine

## 2011-12-07 VITALS — BP 114/80 | HR 76 | Temp 98.7°F | Wt 202.5 lb

## 2011-12-07 DIAGNOSIS — L209 Atopic dermatitis, unspecified: Secondary | ICD-10-CM

## 2011-12-07 DIAGNOSIS — L2089 Other atopic dermatitis: Secondary | ICD-10-CM

## 2011-12-07 DIAGNOSIS — L259 Unspecified contact dermatitis, unspecified cause: Secondary | ICD-10-CM

## 2011-12-07 DIAGNOSIS — L309 Dermatitis, unspecified: Secondary | ICD-10-CM

## 2011-12-09 DIAGNOSIS — L309 Dermatitis, unspecified: Secondary | ICD-10-CM | POA: Insufficient documentation

## 2011-12-09 NOTE — Progress Notes (Signed)
  Subjective:    Patient ID: Debra Jacobson, female    DOB: May 23, 1960, 51 y.o.   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 She has been able to come off antihypertensive medication status post gastric bypass surgery. She's now down to a size 14. Is working in [employer] and also at [employer].  Has had recent flare of eczema on her neck. Has had itching and tearing posterior neck area. Previously used triamcinolone cream.    Review of Systems     Objective:   Physical Exam Anterior and posterior neck with macular papular rash and a few excoriations. No evidence of secondary bacterial infection       Assessment & Plan:  Eczema of neck  Status post gastric bypass surgery  Plan: Triamcinolone cream 0.1% 60 g to use sparingly 3 times a day with refills.

## 2011-12-09 NOTE — Patient Instructions (Addendum)
Use triamcinolone cream sparingly 3 times a day until controlled and then cut back on the use of that  as needed

## 2012-02-16 ENCOUNTER — Other Ambulatory Visit: Payer: Self-pay

## 2012-05-13 ENCOUNTER — Other Ambulatory Visit: Payer: Self-pay | Admitting: Internal Medicine

## 2012-05-13 DIAGNOSIS — Z1231 Encounter for screening mammogram for malignant neoplasm of breast: Secondary | ICD-10-CM

## 2012-05-23 ENCOUNTER — Ambulatory Visit (HOSPITAL_COMMUNITY)
Admission: RE | Admit: 2012-05-23 | Discharge: 2012-05-23 | Disposition: A | Discharge status: Home / Self Care | Payer: Commercial Managed Care - PPO | Source: Ambulatory Visit | Attending: Internal Medicine | Admitting: Internal Medicine

## 2012-05-23 DIAGNOSIS — Z1231 Encounter for screening mammogram for malignant neoplasm of breast: Secondary | ICD-10-CM | POA: Insufficient documentation

## 2012-07-14 ENCOUNTER — Other Ambulatory Visit: Payer: Self-pay | Admitting: Internal Medicine

## 2012-07-14 ENCOUNTER — Ambulatory Visit (INDEPENDENT_AMBULATORY_CARE_PROVIDER_SITE_OTHER): Payer: Commercial Managed Care - PPO | Admitting: Family Medicine

## 2012-07-14 ENCOUNTER — Ambulatory Visit: Payer: Commercial Managed Care - PPO

## 2012-07-14 VITALS — BP 124/84 | HR 52 | Temp 97.5°F | Resp 18 | Ht 67.5 in | Wt 208.0 lb

## 2012-07-14 DIAGNOSIS — S92912A Unspecified fracture of left toe(s), initial encounter for closed fracture: Secondary | ICD-10-CM

## 2012-07-14 DIAGNOSIS — M25475 Effusion, left foot: Secondary | ICD-10-CM

## 2012-07-14 DIAGNOSIS — M25473 Effusion, unspecified ankle: Secondary | ICD-10-CM

## 2012-07-14 DIAGNOSIS — S92919A Unspecified fracture of unspecified toe(s), initial encounter for closed fracture: Secondary | ICD-10-CM

## 2012-07-14 DIAGNOSIS — M79609 Pain in unspecified limb: Secondary | ICD-10-CM

## 2012-07-14 DIAGNOSIS — M79672 Pain in left foot: Secondary | ICD-10-CM

## 2012-07-14 DIAGNOSIS — M25476 Effusion, unspecified foot: Secondary | ICD-10-CM

## 2012-07-14 MED ORDER — HYDROCODONE-ACETAMINOPHEN 5-325 MG PO TABS: 1.0000 | ORAL_TABLET | Freq: Four times a day (QID) | ORAL | Status: DC | PRN

## 2012-07-14 NOTE — Progress Notes (Signed)
[facility]:  Office Visit  Chief Complaint:  Chief Complaint  Patient presents with  . Foot Pain    left foot 2,3,4th toes  x 1 week     HPI: Debra Jacobson is a 52 y.o. female who complains of  5 day history of left foot pain in the 2nd to 4rth digits, she was running/walking fast around the corner and hit a "nonmoving boulder" at the water park, [employer]. She has swelling, it is extremely painful, She has taken ibuprofen 800 mg every other day. She has buddy tapped the toes. 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 She works 12 hr shifts and is constantly on the go. Denies osteoporosis/osteopenia.   Past Medical History  Diagnosis Date  . GERD (gastroesophageal reflux disease)   . Hypertension   . Allergy     all rhinitis  . Dependent edema   . Dermatitis    Past Surgical History  Procedure Laterality Date  . Cesarean section      x2  . Tubal ligation     History   Social History  . Marital Status: Married    Spouse Name: N/A    Number of Children: N/A  . Years of Education: N/A   Social History Main Topics  . Smoking status: Former Smoker    Types: Cigarettes    Quit date: 08/29/1991  . Smokeless tobacco: Never Used  . Alcohol Use: No  . Drug Use: No  . Sexually Active: None   Other Topics Concern  . None   Social History Narrative  . None   Family History  Problem Relation Age of Onset  . Hyperlipidemia Mother    No Known Allergies Prior to Admission medications   Medication Sig Start Date End Date Taking? Authorizing Provider  Biotin 1 MG CAPS Take by mouth.   Yes Historical Provider, MD  calcium carbonate (TUMS - DOSED IN MG ELEMENTAL CALCIUM) 500 MG chewable tablet Chew 1 tablet by mouth daily.   Yes Historical Provider, MD  cetirizine (ZYRTEC) 10 MG tablet Take 10 mg by mouth daily.   Yes Historical Provider, MD  cholecalciferol (VITAMIN D) 1000 UNITS tablet Take 1,000 Units by mouth daily.     Yes  Historical Provider, MD  fish oil-omega-3 fatty acids 1000 MG capsule Take 2 g by mouth daily.   Yes Historical Provider, MD  loratadine (CLARITIN) 10 MG tablet Take 10 mg by mouth daily.     Yes Historical Provider, MD  methylcellulose packet Take by mouth 2 (two) times daily.   Yes Historical Provider, MD  Multiple Vitamins-Minerals (MULTIVITAMIN WITH MINERALS) tablet Take 1 tablet by mouth daily.   Yes Historical Provider, MD     ROS: The patient denies fevers, chills, night sweats, unintentional weight loss, chest pain, palpitations, wheezing, dyspnea on exertion, nausea, vomiting, abdominal pain, dysuria, hematuria, melena, numbness, weakness, or tingling.   All other systems have been reviewed and were otherwise negative with the exception of those mentioned in the HPI and as above.    PHYSICAL EXAM: Filed Vitals:   07/14/12 1149  BP: 124/84  Pulse: 52  Temp: 97.5 F (36.4 C)  Resp: 18   Filed Vitals:   07/14/12 1149  Height: 5' 7.5" (1.715 m)  Weight: 208 lb (94.348 kg)   Body mass index is 32.08 kg/(m^2).  General: Alert, no acute distress HEENT:  Normocephalic, atraumatic, oropharynx patent.  Cardiovascular:  Regular rate  and rhythm, no rubs murmurs or gallops.  No Carotid bruits, radial pulse intact. No pedal edema.  Respiratory: Clear to auscultation bilaterally.  No wheezes, rales, or rhonchi.  No cyanosis, no use of accessory musculature GI: No organomegaly, abdomen is soft and non-tender, positive bowel sounds.  No masses. Skin: No rashes. Neurologic: Facial musculature symmetric. Psychiatric: Patient is appropriate throughout our interaction. Lymphatic: No cervical lymphadenopathy Musculoskeletal: Gait limp  Right foot-no open fx, no deformities, min swelling, very tender to touch 2-4 th toe just on the toes, not anywhere else, + DP. Sensationintacat, good cap refill   LABS: Results for orders placed in visit on 10/26/11  VITAMIN B12      Result Value Range    Vitamin B-12 494  211 - 911 pg/mL  VITAMIN B1      Result Value Range   Vitamin B1 (Thiamine) 13  8 - 30 nmol/L  FERRITIN      Result Value Range   Ferritin 67  10 - 291 ng/mL  HEMOGLOBIN A1C      Result Value Range   Hemoglobin A1C 5.5  <5.7 %   Mean Plasma Glucose 111  <117 mg/dL  PREALBUMIN      Result Value Range   Prealbumin 9.1 (*) 17.0 - 34.0 mg/dL     EKG/XRAY:   Primary read interpreted by Dr. Conley Rolls at [facility]. Minimally displaced Left 3rd proximal toe/phalanx fracture    ASSESSMENT/PLAN: Encounter Diagnoses  Name Primary?  . Left foot pain   . Swelling of foot joint, left   . Toe fracture, left, closed, initial encounter Yes   Post op shoe Refer to ortho, Dr. Magnus Ivan F/u prn    LE, THAO PHUONG, DO 07/14/2012 2:39 PM

## 2012-07-22 DIAGNOSIS — Z0289 Encounter for other administrative examinations: Secondary | ICD-10-CM

## 2012-07-29 ENCOUNTER — Telehealth: Payer: Self-pay

## 2012-07-29 NOTE — Telephone Encounter (Signed)
Patient is requesting written report on her x-ray she recently had done.     [phone]  [phone]

## 2012-07-29 NOTE — Telephone Encounter (Signed)
Report printed, at front desk for pick up.

## 2012-11-06 ENCOUNTER — Other Ambulatory Visit: Payer: Self-pay

## 2013-07-27 DIAGNOSIS — B182 Chronic viral hepatitis C: Secondary | ICD-10-CM | POA: Insufficient documentation

## 2013-07-27 DIAGNOSIS — R7989 Other specified abnormal findings of blood chemistry: Secondary | ICD-10-CM | POA: Insufficient documentation

## 2013-07-27 DIAGNOSIS — E663 Overweight: Secondary | ICD-10-CM | POA: Insufficient documentation

## 2016-05-22 ENCOUNTER — Emergency Department (HOSPITAL_COMMUNITY)
Admission: EM | Admit: 2016-05-22 | Discharge: 2016-05-22 | Disposition: A | Discharge status: Home / Self Care | Payer: 59 | Attending: Physician Assistant | Admitting: Physician Assistant

## 2016-05-22 ENCOUNTER — Encounter (HOSPITAL_COMMUNITY): Payer: Self-pay | Admitting: Emergency Medicine

## 2016-05-22 DIAGNOSIS — L209 Atopic dermatitis, unspecified: Secondary | ICD-10-CM | POA: Diagnosis not present

## 2016-05-22 DIAGNOSIS — M25561 Pain in right knee: Secondary | ICD-10-CM | POA: Diagnosis present

## 2016-05-22 DIAGNOSIS — M79604 Pain in right leg: Secondary | ICD-10-CM

## 2016-05-22 DIAGNOSIS — R600 Localized edema: Secondary | ICD-10-CM | POA: Diagnosis not present

## 2016-05-22 DIAGNOSIS — R609 Edema, unspecified: Secondary | ICD-10-CM

## 2016-05-22 DIAGNOSIS — I10 Essential (primary) hypertension: Secondary | ICD-10-CM | POA: Diagnosis not present

## 2016-05-22 DIAGNOSIS — Z87891 Personal history of nicotine dependence: Secondary | ICD-10-CM | POA: Insufficient documentation

## 2016-05-22 MED ORDER — ENOXAPARIN SODIUM 80 MG/0.8ML ~~LOC~~ SOLN
80.0000 mg | Freq: Two times a day (BID) | SUBCUTANEOUS | Status: DC
Start: 2016-05-22 — End: 2016-05-23
  Administered 2016-05-22: 80 mg via SUBCUTANEOUS
  Filled 2016-05-22 (×2): qty 0.8

## 2016-05-22 MED ORDER — TRIAMCINOLONE ACETONIDE 0.1 % EX CREA: 1.0000 "application " | TOPICAL_CREAM | Freq: Four times a day (QID) | CUTANEOUS | 0 refills | Status: DC | PRN

## 2016-05-22 NOTE — ED Triage Notes (Signed)
Pt c/o bilateral leg pain and pruritis, right leg pain worsened today. Left leg pruritic with hyperpigmented, reddened, swollen skin with overlying excoriations, states she has eczema. No recent surgery or prolonged travel. Right posterior calf and popliteal tenderness to palpation.

## 2016-05-22 NOTE — ED Provider Notes (Signed)
[facility] DEPT Provider Note   CSN: [id number] Arrival date & time: 05/22/16  1747     History   Chief Complaint Chief Complaint  Patient presents with  . Leg Pain    HPI Debra Jacobson is a 56 y.o. female.  HPI   Pt with hx peripheral edema, eczema p/w pain in the back of her right knee.  Has had increased peripheral edema for the past month.  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  Uses TED hose while at work to help with the swelling but they have caused her to itch and form a darkened rash.  She denies pain in this area.  Over the past few days she noticed a new pain in the back of her right knee that she is worried is a DVT.  No exogenous estrogen, no hx clot but she does travel by car to various states ([place], [place], [place] recently) for work.  Denies fevers, chills, CP, SOB, orthopnea, cough.  Denies injury to her legs.    Past Medical History:  Diagnosis Date  . Allergy    all rhinitis  . Dependent edema   . Dermatitis   . GERD (gastroesophageal reflux disease)   . Hypertension     Patient Active Problem List   Diagnosis Date Noted  . Eczema 12/09/2011  . Status post gastric bypass for obesity 05/30/2011    Past Surgical History:  Procedure Laterality Date  . CESAREAN SECTION     x2  . TUBAL LIGATION      OB History    No data available       Home Medications    Prior to Admission medications   Medication Sig Start Date End Date Taking? Authorizing Provider  diphenhydrAMINE (BENADRYL) 25 MG tablet Take 50 mg by mouth every 6 (six) hours as needed for sleep.   Yes [provider]  HYDROcodone-acetaminophen (LORTAB) 5-325 MG tablet Take 1 tablet by mouth every 6 (six) hours as needed for moderate pain.   Yes [provider]  HYDROcodone-acetaminophen (LORTAB) 5-325 MG per tablet Take 1 tablet by mouth every 6 (six) hours as needed for pain. Patient not taking: Reported on 05/22/2016  07/14/12   Le, Thao P, DO  triamcinolone cream (KENALOG) 0.1 % Apply 1 application topically 4 (four) times daily as needed. 05/22/16   Clayton Bibles, PA-C    Family History Family History  Problem Relation Age of Onset  . Hyperlipidemia Mother     Social History Social History  Substance Use Topics  . Smoking status: Former Smoker    Types: Cigarettes    Quit date: 08/29/1991  . Smokeless tobacco: Never Used  . Alcohol use No     Allergies   Patient has no known allergies.   Review of Systems Review of Systems  All other systems reviewed and are negative.    Physical Exam Updated Vital Signs BP (!) 132/94 (BP Location: Right Arm)   Pulse 89   Temp 98.4 F (36.9 C) (Oral)   Resp 20   Ht 5\' 7"  (1.702 m)   Wt 79.4 kg (175 lb)   LMP 05/01/2012   SpO2 100%   BMI 27.41 kg/m   Physical Exam  Constitutional: She appears well-developed and well-nourished.  HENT:  Head: Normocephalic and atraumatic.  Neck: Neck supple.  Cardiovascular: Normal rate, regular rhythm and normal heart sounds.   Pulmonary/Chest: Effort normal and breath sounds  normal. No respiratory distress. She has no wheezes. She has no rales.  Musculoskeletal: She exhibits edema.  Pitting edema of the bilateral feet and ankles.  Bilateral lower legs with hyperpigmented skin over the medial calf, L>R with few excoriations on the left.  No erythema, edema, warmth, discharge, or tenderness on left leg excoriations.  Tenderness posterior to right knee.  No erythema or warmth associated with the joint.  No thigh tenderness or edema.    Neurological: She is alert.  Nursing note and vitals reviewed.    ED Treatments / Results  Labs (all labs ordered are listed, but only abnormal results are displayed) Labs Reviewed - No data to display  EKG  EKG Interpretation None       Radiology No results found.  Procedures Procedures (including critical care time)  Medications Ordered in ED Medications    enoxaparin (LOVENOX) injection 80 mg (80 mg Subcutaneous Given 05/22/16 2145)     Initial Impression / Assessment and Plan / ED Course  I have reviewed the triage vital signs and the nursing notes.  Pertinent labs & imaging results that were available during my care of the patient were reviewed by me and considered in my medical decision making (see chart for details).     Afebrile, nontoxic patient with bilateral lower extremity edema that is chronic and worsened with standing and working long hours.  Has hyperpigmented areas with itching that is likely localized allergic reaction from the TED hose.  There is no tenderness or clinical concern for cellulitis.  On the right leg she has tenderness posterior to the knee and proximal calf.  Given patient's frequent travel she is at risk for DVT and is worried about DVT.  Lovenox given in ED with DVT study ordered for tomorrow morning at [facility].  Plan explained in detail to the patient who verbalizes understanding.   D/C home with follow up in the morning, topical steroid for skin reaction and advised to change from TED hose to other type of compression stocking.  Discussed result, findings, treatment, and follow up  with patient.  Pt given return precautions.  Pt verbalizes understanding and agrees with plan.       Final Clinical Impressions(s) / ED Diagnoses   Final diagnoses:  Right leg pain  Atopic dermatitis, unspecified type  Peripheral edema    New Prescriptions Discharge Medication List as of 05/22/2016  8:53 PM       Clayton Bibles, Hershal Coria 05/22/16 2317    Mackuen, Fredia Sorrow, MD 05/22/16 2350

## 2016-05-22 NOTE — Progress Notes (Signed)
ANTICOAGULATION CONSULT NOTE - Initial Consult  Pharmacy Consult for Lovenox Indication: R/O DVT  No Known Allergies  Patient Measurements: Height: 5\' 7"  (170.2 cm) Weight: 175 lb (79.4 kg) IBW/kg (Calculated) : 61.6  Vital Signs: Temp: 98.4 F (36.9 C) (05/22 1818) Temp Source: Oral (05/22 1818) BP: 126/86 (05/22 1818) Pulse Rate: 90 (05/22 1818)  Labs: No results for input(s): HGB, HCT, PLT, APTT, LABPROT, INR, HEPARINUNFRC, HEPRLOWMOCWT, CREATININE, CKTOTAL, CKMB, TROPONINI in the last 72 hours.  CrCl cannot be calculated (Patient's most recent lab result is older than the maximum 21 days allowed.).   Medical History: Past Medical History:  Diagnosis Date  . Allergy    all rhinitis  . Dependent edema   . Dermatitis   . GERD (gastroesophageal reflux disease)   . Hypertension     Assessment:  56 yr female with bilateral leg pain and pruritis.   Pharmacy consulted to dose Lovenox for r/o DVT  Plans for DVT study tomorrow  Goal of Therapy:  Full anticoagulation Monitor platelets by anticoagulation protocol: Yes   Plan:  Lovenox 80mg  sq q12h  Wynona Canes Toribio Harbour, PharmD 05/22/2016,8:42 PM

## 2016-05-22 NOTE — ED Notes (Signed)
Pt states she thinks she has a blood clot in Rt leg. Pt also states she does NOT have a history of blood clots, and she doesn't remember injuring the leg, but per Pt a blood clot is the only thing she thinks it could be.

## 2016-05-22 NOTE — Discharge Instructions (Signed)
Read the information below.  Use the prescribed medication as directed.  Please discuss all new medications with your pharmacist.  You may return to the Emergency Department at any time for worsening condition or any new symptoms that concern you.    If you develop chest pain, shortness of breath, fever, you pass out, or become weak or dizzy, return to the ER for a recheck.    °

## 2016-05-23 ENCOUNTER — Ambulatory Visit (HOSPITAL_COMMUNITY)
Admission: RE | Admit: 2016-05-23 | Discharge: 2016-05-23 | Disposition: A | Discharge status: Home / Self Care | Payer: 59 | Source: Ambulatory Visit | Attending: Physician Assistant | Admitting: Physician Assistant

## 2016-05-23 DIAGNOSIS — R600 Localized edema: Secondary | ICD-10-CM | POA: Insufficient documentation

## 2016-05-23 DIAGNOSIS — M7989 Other specified soft tissue disorders: Secondary | ICD-10-CM | POA: Diagnosis not present

## 2016-05-23 DIAGNOSIS — M79609 Pain in unspecified limb: Secondary | ICD-10-CM

## 2016-05-23 DIAGNOSIS — M25861 Other specified joint disorders, right knee: Secondary | ICD-10-CM | POA: Diagnosis not present

## 2016-05-23 DIAGNOSIS — M79604 Pain in right leg: Secondary | ICD-10-CM | POA: Diagnosis not present

## 2016-05-23 NOTE — Progress Notes (Signed)
Preliminary results by tech - Bilateral Lower Ext. Venous Duplex Completed. Negative for deep and superficial vein thrombosis in both legs. A small cystic structure was noted behind the right knee - questionable Baker's Cyst.  Oda Cogan, BS, RDMS, RVT '

## 2017-03-08 ENCOUNTER — Encounter (HOSPITAL_COMMUNITY): Payer: Self-pay

## 2017-03-08 ENCOUNTER — Other Ambulatory Visit: Payer: Self-pay

## 2017-03-08 ENCOUNTER — Emergency Department (HOSPITAL_COMMUNITY): Payer: No Typology Code available for payment source

## 2017-03-08 ENCOUNTER — Emergency Department (HOSPITAL_COMMUNITY)
Admission: EM | Admit: 2017-03-08 | Discharge: 2017-03-09 | Disposition: A | Discharge status: Home / Self Care | Payer: No Typology Code available for payment source | Attending: Emergency Medicine | Admitting: Emergency Medicine

## 2017-03-08 DIAGNOSIS — Z23 Encounter for immunization: Secondary | ICD-10-CM | POA: Insufficient documentation

## 2017-03-08 DIAGNOSIS — Z041 Encounter for examination and observation following transport accident: Secondary | ICD-10-CM | POA: Diagnosis present

## 2017-03-08 DIAGNOSIS — S99911A Unspecified injury of right ankle, initial encounter: Secondary | ICD-10-CM | POA: Insufficient documentation

## 2017-03-08 DIAGNOSIS — R0789 Other chest pain: Secondary | ICD-10-CM | POA: Diagnosis not present

## 2017-03-08 DIAGNOSIS — Z87891 Personal history of nicotine dependence: Secondary | ICD-10-CM | POA: Diagnosis not present

## 2017-03-08 DIAGNOSIS — Y939 Activity, unspecified: Secondary | ICD-10-CM | POA: Insufficient documentation

## 2017-03-08 DIAGNOSIS — Y929 Unspecified place or not applicable: Secondary | ICD-10-CM | POA: Insufficient documentation

## 2017-03-08 DIAGNOSIS — Z9884 Bariatric surgery status: Secondary | ICD-10-CM | POA: Insufficient documentation

## 2017-03-08 DIAGNOSIS — S2241XA Multiple fractures of ribs, right side, initial encounter for closed fracture: Secondary | ICD-10-CM | POA: Insufficient documentation

## 2017-03-08 DIAGNOSIS — Y999 Unspecified external cause status: Secondary | ICD-10-CM | POA: Diagnosis not present

## 2017-03-08 DIAGNOSIS — I10 Essential (primary) hypertension: Secondary | ICD-10-CM | POA: Insufficient documentation

## 2017-03-08 MED ORDER — ACETAMINOPHEN 500 MG PO TABS
1000.0000 mg | ORAL_TABLET | Freq: Once | ORAL | Status: AC
  Administered 2017-03-09: 1000 mg via ORAL
  Filled 2017-03-08: qty 2

## 2017-03-08 MED ORDER — TETANUS-DIPHTH-ACELL PERTUSSIS 5-2.5-18.5 LF-MCG/0.5 IM SUSP
0.5000 mL | Freq: Once | INTRAMUSCULAR | Status: AC
  Administered 2017-03-09: 0.5 mL via INTRAMUSCULAR
  Filled 2017-03-08: qty 0.5
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
## 2017-03-09 ENCOUNTER — Encounter (HOSPITAL_COMMUNITY): Payer: Self-pay | Admitting: Radiology

## 2017-03-09 ENCOUNTER — Emergency Department (HOSPITAL_COMMUNITY): Payer: No Typology Code available for payment source

## 2017-03-09 LAB — BASIC METABOLIC PANEL
ANION GAP: 5 (ref 5–15)
BUN: 12 mg/dL (ref 6–20)
CALCIUM: 8.2 mg/dL — AB (ref 8.9–10.3)
CO2: 22 mmol/L (ref 22–32)
Chloride: 109 mmol/L (ref 101–111)
Creatinine, Ser: 0.96 mg/dL (ref 0.44–1.00)
Glucose, Bld: 123 mg/dL — ABNORMAL HIGH (ref 65–99)
Potassium: 4.2 mmol/L (ref 3.5–5.1)
Sodium: 136 mmol/L (ref 135–145)

## 2017-03-09 LAB — CBC
HEMATOCRIT: 28.9 % — AB (ref 36.0–46.0)
HEMOGLOBIN: 9.4 g/dL — AB (ref 12.0–15.0)
MCH: 32.5 pg (ref 26.0–34.0)
MCHC: 32.5 g/dL (ref 30.0–36.0)
MCV: 100 fL (ref 78.0–100.0)
Platelets: 185 10*3/uL (ref 150–400)
RBC: 2.89 MIL/uL — ABNORMAL LOW (ref 3.87–5.11)
RDW: 19.4 % — AB (ref 11.5–15.5)
WBC: 3.9 10*3/uL — AB (ref 4.0–10.5)

## 2017-03-09 MED ORDER — IOPAMIDOL (ISOVUE-300) INJECTION 61%
INTRAVENOUS | Status: AC
  Filled 2017-03-09: qty 100

## 2017-03-09 MED ORDER — METHOCARBAMOL 500 MG PO TABS: 500.0000 mg | ORAL_TABLET | Freq: Two times a day (BID) | ORAL | 0 refills | Status: DC

## 2017-03-09 MED ORDER — IBUPROFEN 800 MG PO TABS: 800.0000 mg | ORAL_TABLET | Freq: Three times a day (TID) | ORAL | 0 refills | Status: DC

## 2017-03-09 MED ORDER — SODIUM CHLORIDE 0.9 % IJ SOLN
INTRAMUSCULAR | Status: AC
  Filled 2017-03-09: qty 50

## 2017-03-09 MED ORDER — IOPAMIDOL (ISOVUE-300) INJECTION 61%
100.0000 mL | Freq: Once | INTRAVENOUS | Status: AC | PRN
  Administered 2017-03-09: 100 mL via INTRAVENOUS

## 2017-03-09 MED ORDER — IBUPROFEN 800 MG PO TABS
800.0000 mg | ORAL_TABLET | Freq: Once | ORAL | Status: AC
  Administered 2017-03-09: 800 mg via ORAL
  Filled 2017-03-09: qty 1

## 2017-03-09 MED ORDER — OXYCODONE HCL 5 MG PO TABS: 5.0000 mg | ORAL_TABLET | Freq: Four times a day (QID) | ORAL | 0 refills | Status: DC | PRN

## 2017-03-09 NOTE — ED Notes (Signed)
Pt. In xray 

## 2017-03-09 NOTE — Discharge Instructions (Signed)
1. Medications: Alternate Tylenol and ibuprofen for pain control, use oxycodone for breakthrough pain, use Robaxin for muscle spasm, usual home medications 2. Treatment: rest, drink plenty of fluids, incentive spirometry 3. Follow Up: Please followup with your primary doctor in 2 days for discussion of your diagnoses and further evaluation after today's visit; if you do not have a primary care doctor use the resource guide provided to find one; Please return to the ER for worsening pain, shortness of breath, fevers, cough or any other concerns

## 2017-03-09 NOTE — ED Notes (Signed)
EKG given to EDP,Pickering,MD. For review. 

## 2018-08-08 ENCOUNTER — Other Ambulatory Visit: Payer: Self-pay | Admitting: Internal Medicine

## 2018-08-08 ENCOUNTER — Other Ambulatory Visit: Payer: Self-pay | Admitting: Obstetrics and Gynecology

## 2018-08-11 ENCOUNTER — Other Ambulatory Visit: Payer: Self-pay | Admitting: Obstetrics and Gynecology

## 2018-08-11 DIAGNOSIS — N6320 Unspecified lump in the left breast, unspecified quadrant: Secondary | ICD-10-CM

## 2018-08-11 DIAGNOSIS — D649 Anemia, unspecified: Secondary | ICD-10-CM | POA: Insufficient documentation

## 2018-08-15 ENCOUNTER — Ambulatory Visit
Admission: RE | Admit: 2018-08-15 | Discharge: 2018-08-15 | Disposition: A | Discharge status: Home / Self Care | Payer: Commercial Managed Care - PPO | Source: Ambulatory Visit | Attending: Obstetrics and Gynecology | Admitting: Obstetrics and Gynecology

## 2018-08-15 ENCOUNTER — Other Ambulatory Visit: Payer: Self-pay

## 2018-08-15 DIAGNOSIS — N6320 Unspecified lump in the left breast, unspecified quadrant: Secondary | ICD-10-CM

## 2018-08-15 DIAGNOSIS — R87619 Unspecified abnormal cytological findings in specimens from cervix uteri: Secondary | ICD-10-CM | POA: Insufficient documentation

## 2018-08-20 ENCOUNTER — Telehealth: Payer: Self-pay | Admitting: Hematology

## 2018-08-20 NOTE — Telephone Encounter (Signed)
Received a new hem referral from Dr. Royston Sinner for anemia. Debra Jacobson has been cld and scheduled to see Dr. Irene Limbo on 8/31 at 11am. I provided the address and location to the facility. Debra Jacobson is aware to arrive 20 minutes early.

## 2018-08-31 NOTE — Progress Notes (Signed)
HEMATOLOGY/ONCOLOGY CONSULTATION NOTE  Date of Service: 09/01/2018  Patient Care Team: Tyson Dense, MD as PCP - General (Obstetrics and Gynecology)  CHIEF COMPLAINTS/PURPOSE OF CONSULTATION:  Anemia  HISTORY OF PRESENTING ILLNESS:   Debra Jacobson is a wonderful 58 y.o. female who has been referred to [place] by Dr Lucillie Garfinkel for evaluation and management of anemia. The pt reports that she is doing well overall.  The pt reports that she was first given the diagnoses of anemia about 10 years ago. Pt is currently taking 1 iron pill every other day. Pt had a gastric bypass surgery in 2014. She initially weighed about 400 lbs and is currently 164 lbs. She has been experiencing a severe lack of appetite and weight loss. When she does attempt to eat she can only take a couple of bites and then has a bowel movement soon after that. She also has abdominal pain after she eats. Pt has seasonal allergies and eczema, for which she has been taking Dupixent for 3 months. Pt has never had a skin biopsy to confirm her eczema diagnosis. It was dormant for about 10 years and came back 2 years ago after an interaction with a cat and has persisted. Pt also takes Vistaril to help with the itch. Pt has never had an allergy test or immune therapy testing. Pt took steroid shots for her carpal tunnel. There were pre-cancerous cells found in pt's pap smear and her Gynecologist is planning on doing a resection. Pt was experiencing black stools a couple of months ago, that were not painful and did not speak with her PCP about it. She had a colonoscopy in 2014 and has never had an endoscopy. Pt believes her ankle swelling is from work and wears compression socks which helps.   Most recent lab results (08/12/2018) of CBC is as follows: WBC at 3.8K, RBC at 2.49, Hgb at 7.8, HCT at 24.3, MCV at 98, MCH at 31.3, MCHC at 32.1, RDW at 15.9, Platelets at 182K.  08/12/2018 Transferrin is 286 08/12/2018 Ferritin is 30    08/12/2018 Iron and TIBC shows Iron Bind, Cap, (TIBC) at 349, UIBC at 303, Iron at 46, Iron Sat at 13.   On review of systems, pt reports pain in her left knee, weight loss, fatigue, enlarged cervical lymph nodes, black/bloody stools, abdominal pain and denies vaginal bleeds, nose bleeds gum bleeds, chest pain, SOB, fevers, chills and any other symptoms.   On PMHx the pt reports gastric bypass surgery, two cesarean sections, tubal ligation.  On Social Hx the pt reports that she has quit smoking, no alcohol use.    MEDICAL HISTORY:  Past Medical History:  Diagnosis Date   Allergy    all rhinitis   Dependent edema    Dermatitis    GERD (gastroesophageal reflux disease)    Hypertension     SURGICAL HISTORY: Past Surgical History:  Procedure Laterality Date   CESAREAN SECTION     x2   TUBAL LIGATION      SOCIAL HISTORY: Social History   Socioeconomic History   Marital status: Married    Spouse name: Not on file   Number of children: Not on file   Years of education: Not on file   Highest education level: Not on file  Occupational History   Not on file  Social Needs   Financial resource strain: Not on file   Food insecurity    Worry: Not on file    Inability: Not  on file   Transportation needs    Medical: Not on file    Non-medical: Not on file  Tobacco Use   Smoking status: Former Smoker    Types: Cigarettes    Quit date: 08/29/1991    Years since quitting: 27.0   Smokeless tobacco: Never Used  Substance and Sexual Activity   Alcohol use: No   Drug use: No   Sexual activity: Not on file  Lifestyle   Physical activity    Days per week: Not on file    Minutes per session: Not on file   Stress: Not on file  Relationships   Social connections    Talks on phone: Not on file    Gets together: Not on file    Attends religious service: Not on file    Active member of club or organization: Not on file    Attends meetings of clubs or  organizations: Not on file    Relationship status: Not on file   Intimate partner violence    Fear of current or ex partner: Not on file    Emotionally abused: Not on file    Physically abused: Not on file    Forced sexual activity: Not on file  Other Topics Concern   Not on file  Social History Narrative   Not on file    FAMILY HISTORY: Family History  Problem Relation Age of Onset   Hyperlipidemia Mother     ALLERGIES:  has No Known Allergies.  MEDICATIONS:  Current Outpatient Medications  Medication Sig Dispense Refill   ibuprofen (ADVIL,MOTRIN) 800 MG tablet Take 1 tablet (800 mg total) by mouth 3 (three) times daily. With food 21 tablet 0   methocarbamol (ROBAXIN) 500 MG tablet Take 1 tablet (500 mg total) by mouth 2 (two) times daily. 20 tablet 0   oxyCODONE (ROXICODONE) 5 MG immediate release tablet Take 1 tablet (5 mg total) by mouth every 6 (six) hours as needed for severe pain. 15 tablet 0   triamcinolone cream (KENALOG) 0.1 % Apply 1 application topically 4 (four) times daily as needed. (Patient not taking: Reported on 03/08/2017) 30 g 0   No current facility-administered medications for this visit.     REVIEW OF SYSTEMS:    10 Point review of Systems was done is negative except as noted above.  PHYSICAL EXAMINATION: ECOG PERFORMANCE STATUS: 1 - Symptomatic but completely ambulatory  . Vitals:   09/01/18 1120  BP: 112/79  Pulse: 72  Resp: 18  Temp: 98.3 F (36.8 C)  SpO2: 100%   Filed Weights   09/01/18 1120  Weight: 164 lb 1.6 oz (74.4 kg)   .Body mass index is 25.7 kg/m.  GENERAL:alert, in no acute distress and comfortable SKIN: no acute rashes, no significant lesions EYES: conjunctiva are pink and non-injected, sclera anicteric OROPHARYNX: MMM, no exudates, no oropharyngeal erythema or ulceration NECK: supple, no JVD LYMPH:  no palpable lymphadenopathy in the cervical, axillary or inguinal regions LUNGS: clear to auscultation b/l with  normal respiratory effort HEART: regular rate & rhythm ABDOMEN:  normoactive bowel sounds , non tender, not distended. Extremity: 2+ pedal edema PSYCH: alert & oriented x 3 with fluent speech NEURO: no focal motor/sensory deficits  LABORATORY DATA:  I have reviewed the data as listed  . CBC Latest Ref Rng & Units 09/01/2018 09/01/2018 03/09/2017  WBC 4.0 - 10.5 K/uL 3.5(L) - 3.9(L)  Hemoglobin 12.0 - 15.0 g/dL 7.9(L) - 9.4(L)  Hematocrit 34.0 - 46.6 % 25.2(L)  25.5(L) 28.9(L)  Platelets 150 - 400 K/uL 202 - 185   . CBC    Component Value Date/Time   WBC 3.5 (L) 09/01/2018 1222   RBC 2.49 (L) 09/01/2018 1222   RBC 2.49 (L) 09/01/2018 1222   HGB 7.9 (L) 09/01/2018 1222   HCT 25.2 (L) 09/01/2018 1224   PLT 202 09/01/2018 1222   MCV 102.4 (H) 09/01/2018 1222   MCH 31.7 09/01/2018 1222   MCHC 31.0 09/01/2018 1222   RDW 18.5 (H) 09/01/2018 1222   LYMPHSABS 1.1 09/01/2018 1222   MONOABS 0.4 09/01/2018 1222   EOSABS 0.1 09/01/2018 1222   BASOSABS 0.0 09/01/2018 1222     . CMP Latest Ref Rng & Units 09/01/2018 03/09/2017 09/10/2011  Glucose 70 - 99 mg/dL 72 123(H) 80  BUN 6 - 20 mg/dL '11 12 12  '$ Creatinine 0.44 - 1.00 mg/dL 0.78 0.96 0.74  Sodium 135 - 145 mmol/L 139 136 141  Potassium 3.5 - 5.1 mmol/L 3.9 4.2 4.1  Chloride 98 - 111 mmol/L 109 109 108  CO2 22 - 32 mmol/L '23 22 30  '$ Calcium 8.9 - 10.3 mg/dL 8.4(L) 8.2(L) 9.4  Total Protein 6.5 - 8.1 g/dL 8.0 - 7.7  Total Bilirubin 0.3 - 1.2 mg/dL 0.3 - 0.4  Alkaline Phos 38 - 126 U/L 101 - 60  AST 15 - 41 U/L 60(H) - 26  ALT 0 - 44 U/L 37 - 19   . Lab Results  Component Value Date   IRON 37 (L) 09/01/2018   TIBC 358 09/01/2018   IRONPCTSAT 10 (L) 09/01/2018   (Iron and TIBC)  Lab Results  Component Value Date   FERRITIN 17 09/01/2018      RADIOGRAPHIC STUDIES: I have personally reviewed the radiological images as listed and agreed with the findings in the report. US Breast Ltd Uni Left Inc Axilla  Result Date:  08/15/2018 CLINICAL DATA:  58 year old female presenting for evaluation of palpable lumps in the left breast at 10, 12 and 2 o'clock. EXAM: DIGITAL DIAGNOSTIC BILATERAL MAMMOGRAM WITH CAD AND TOMO ULTRASOUND LEFT BREAST COMPARISON:  Previous exam(s). ACR Breast Density Category b: There are scattered areas of fibroglandular density. FINDINGS: BBs have been placed at the 10 and 2 o'clock position in the posterior left breast indicating to the 3 palpable sites of concern. No suspicious masses are seen deep to the palpable markers. At the 12 o'clock position, posterior depth there is a 3 mm oval circumscribed mass. No suspicious calcifications, masses or areas of distortion are seen in the bilateral breasts. Mammographic images were processed with CAD. On physical exam, there is a tiny superficial mass palpated at the 12 o'clock position in the left breast. No suspicious masses are identified in the upper-outer or upper inner quadrants of the left breast. Ultrasound of the left breast at 12 o'clock, 7 cm from the nipple demonstrates a superficial circumscribed oval mass measuring 3 x 2 x 3 mm. No suspicious findings are seen in the upper outer or upper inner quadrants of the left breast. IMPRESSION: 1. A benign cyst is seen at the palpable site in the left breast at 12 o'clock. 2. No mammographic or targeted sonographic abnormalities are seen in the upper outer or upper inner quadrants of the left breast at the palpable sites of concern. 3.  No mammographic evidence of malignancy in the bilateral breasts. RECOMMENDATION: 1. Clinical follow-up recommended for the palpable areas of concern in the bilateral breasts. Any further workup should be based on  clinical grounds. 2.  Screening mammogram in one year.(Code:SM-B-01Y) I have discussed the findings and recommendations with the patient. Results were also provided in writing at the conclusion of the visit. If applicable, a reminder letter will be sent to the patient  regarding the next appointment. BI-RADS CATEGORY  2: Benign. Electronically Signed   By: Ammie Ferrier M.D.   On: 08/15/2018 09:47   Mm Diag Breast Tomo Bilateral  Result Date: 08/15/2018 CLINICAL DATA:  58 year old female presenting for evaluation of palpable lumps in the left breast at 10, 12 and 2 o'clock. EXAM: DIGITAL DIAGNOSTIC BILATERAL MAMMOGRAM WITH CAD AND TOMO ULTRASOUND LEFT BREAST COMPARISON:  Previous exam(s). ACR Breast Density Category b: There are scattered areas of fibroglandular density. FINDINGS: BBs have been placed at the 10 and 2 o'clock position in the posterior left breast indicating to the 3 palpable sites of concern. No suspicious masses are seen deep to the palpable markers. At the 12 o'clock position, posterior depth there is a 3 mm oval circumscribed mass. No suspicious calcifications, masses or areas of distortion are seen in the bilateral breasts. Mammographic images were processed with CAD. On physical exam, there is a tiny superficial mass palpated at the 12 o'clock position in the left breast. No suspicious masses are identified in the upper-outer or upper inner quadrants of the left breast. Ultrasound of the left breast at 12 o'clock, 7 cm from the nipple demonstrates a superficial circumscribed oval mass measuring 3 x 2 x 3 mm. No suspicious findings are seen in the upper outer or upper inner quadrants of the left breast. IMPRESSION: 1. A benign cyst is seen at the palpable site in the left breast at 12 o'clock. 2. No mammographic or targeted sonographic abnormalities are seen in the upper outer or upper inner quadrants of the left breast at the palpable sites of concern. 3.  No mammographic evidence of malignancy in the bilateral breasts. RECOMMENDATION: 1. Clinical follow-up recommended for the palpable areas of concern in the bilateral breasts. Any further workup should be based on clinical grounds. 2.  Screening mammogram in one year.(Code:SM-B-01Y) I have discussed  the findings and recommendations with the patient. Results were also provided in writing at the conclusion of the visit. If applicable, a reminder letter will be sent to the patient regarding the next appointment. BI-RADS CATEGORY  2: Benign. Electronically Signed   By: Ammie Ferrier M.D.   On: 08/15/2018 09:47    ASSESSMENT & PLAN:   1) Macrocytic Anemia 2) Iron deficiency- GI blood loss vs poor absorption related to Gastric bypass surgery. PLAN -Discussed patient's most recent labs from 08/12/2018, show WBC at 3.8K, RBC at 2.49, Hgb at 7.8, HCT at 24.3, MCV at 98, MCH at 31.3, MCHC at 32.1, RDW at 15.9, Platelets at 182K.  -Discussed 08/12/2018 Transferrin is 286 -Discussed 08/12/2018 Ferritin is 30  -Discussed 08/12/2018 Iron and TIBC shows Iron Bind, Cap, (TIBC) at 349, UIBC at 303, Iron at 46, Iron Sat at 13.  -Discussed that nutritional deficiencies and thyroid dysfunction among other causes could contribute to her enlarged red blood cells.  -Pt does not want a blood transfusion, even under life threatening conditions. (was a previous Jehovas Witness) -If we cannot elucidate the cause of her low blood counts from blood work a bone marrow biopsy may be needed.  -Will ask PCP to refer pt to a Gastroenterologist for GI r/o GI bleeding. -Will see back in 1 week via phone  FOLLOW UP: Labs today Phone  visit with Dr Irene Limbo in 7 days  All of the patients questions were answered with apparent satisfaction. The patient knows to call the clinic with any problems, questions or concerns.  I spent 35mns  counseling the patient face to face. The total time spent in the appointment was 388ms  and more than 50% was on counseling and direct patient cares.    GaSullivan LoneD MSChestnut RidgeAHIVMS SCMayo Clinic Arizona Dba Mayo Clinic ScottsdaleTMercy Medical Center-Dubuqueematology/Oncology Physician CoMemorial Satilla Health(Office):       [phone] cell):  [phone]):           [phone]/31/2020 12:13 PM  I, JaYevette Edwardsam acting as a  scribe for Dr. GaSullivan Lone  .I have reviewed the above documentation for accuracy and completeness, and I agree with the above. .GBrunetta GeneraD

## 2018-09-01 ENCOUNTER — Telehealth: Payer: Self-pay | Admitting: Hematology

## 2018-09-01 ENCOUNTER — Other Ambulatory Visit: Payer: Self-pay

## 2018-09-01 ENCOUNTER — Inpatient Hospital Stay: Payer: Commercial Managed Care - PPO | Attending: Hematology | Admitting: Hematology

## 2018-09-01 ENCOUNTER — Inpatient Hospital Stay: Payer: Commercial Managed Care - PPO

## 2018-09-01 VITALS — BP 112/79 | HR 72 | Temp 98.3°F | Resp 18 | Ht 67.0 in | Wt 164.1 lb

## 2018-09-01 DIAGNOSIS — Z87891 Personal history of nicotine dependence: Secondary | ICD-10-CM | POA: Insufficient documentation

## 2018-09-01 DIAGNOSIS — D472 Monoclonal gammopathy: Secondary | ICD-10-CM

## 2018-09-01 DIAGNOSIS — Z9884 Bariatric surgery status: Secondary | ICD-10-CM | POA: Diagnosis not present

## 2018-09-01 DIAGNOSIS — I10 Essential (primary) hypertension: Secondary | ICD-10-CM | POA: Insufficient documentation

## 2018-09-01 DIAGNOSIS — D649 Anemia, unspecified: Secondary | ICD-10-CM

## 2018-09-01 DIAGNOSIS — Z791 Long term (current) use of non-steroidal anti-inflammatories (NSAID): Secondary | ICD-10-CM | POA: Diagnosis not present

## 2018-09-01 LAB — CBC WITH DIFFERENTIAL/PLATELET
Abs Immature Granulocytes: 0.01 10*3/uL (ref 0.00–0.07)
Basophils Absolute: 0 10*3/uL (ref 0.0–0.1)
Basophils Relative: 1 %
Eosinophils Absolute: 0.1 10*3/uL (ref 0.0–0.5)
Eosinophils Relative: 3 %
HCT: 25.5 % — ABNORMAL LOW (ref 36.0–46.0)
Hemoglobin: 7.9 g/dL — ABNORMAL LOW (ref 12.0–15.0)
Immature Granulocytes: 0 %
Lymphocytes Relative: 31 %
Lymphs Abs: 1.1 10*3/uL (ref 0.7–4.0)
MCH: 31.7 pg (ref 26.0–34.0)
MCHC: 31 g/dL (ref 30.0–36.0)
MCV: 102.4 fL — ABNORMAL HIGH (ref 80.0–100.0)
Monocytes Absolute: 0.4 10*3/uL (ref 0.1–1.0)
Monocytes Relative: 10 %
Neutro Abs: 1.9 10*3/uL (ref 1.7–7.7)
Neutrophils Relative %: 55 %
Platelets: 202 10*3/uL (ref 150–400)
RBC: 2.49 MIL/uL — ABNORMAL LOW (ref 3.87–5.11)
RDW: 18.5 % — ABNORMAL HIGH (ref 11.5–15.5)
WBC: 3.5 10*3/uL — ABNORMAL LOW (ref 4.0–10.5)
nRBC: 0 % (ref 0.0–0.2)

## 2018-09-01 LAB — CMP (CANCER CENTER ONLY)
ALT: 37 U/L (ref 0–44)
AST: 60 U/L — ABNORMAL HIGH (ref 15–41)
Albumin: 3.7 g/dL (ref 3.5–5.0)
Alkaline Phosphatase: 101 U/L (ref 38–126)
Anion gap: 7 (ref 5–15)
BUN: 11 mg/dL (ref 6–20)
CO2: 23 mmol/L (ref 22–32)
Calcium: 8.4 mg/dL — ABNORMAL LOW (ref 8.9–10.3)
Chloride: 109 mmol/L (ref 98–111)
Creatinine: 0.78 mg/dL (ref 0.44–1.00)
GFR, Est AFR Am: >60 mL/min (ref >60)
GFR, Estimated: >60 mL/min (ref >60)
Glucose, Bld: 72 mg/dL (ref 70–99)
Potassium: 3.9 mmol/L (ref 3.5–5.1)
Sodium: 139 mmol/L (ref 135–145)
Total Bilirubin: 0.3 mg/dL (ref 0.3–1.2)
Total Protein: 8 g/dL (ref 6.5–8.1)

## 2018-09-01 LAB — IRON AND TIBC
Iron: 37 ug/dL — ABNORMAL LOW (ref 41–142)
Saturation Ratios: 10 % — ABNORMAL LOW (ref 21–57)
TIBC: 358 ug/dL (ref 236–444)
UIBC: 321 ug/dL (ref 120–384)

## 2018-09-01 LAB — RETICULOCYTES
Immature Retic Fract: 15.7 % (ref 2.3–15.9)
RBC.: 2.49 MIL/uL — ABNORMAL LOW (ref 3.87–5.11)
Retic Count, Absolute: 38.8 10*3/uL (ref 19.0–186.0)
Retic Ct Pct: 1.6 % (ref 0.4–3.1)

## 2018-09-01 LAB — FERRITIN: Ferritin: 17 ng/mL (ref 11–307)

## 2018-09-01 LAB — VITAMIN B12: Vitamin B-12: 139 pg/mL — ABNORMAL LOW (ref 180–914)

## 2018-09-01 LAB — LACTATE DEHYDROGENASE: LDH: 220 U/L — ABNORMAL HIGH (ref 98–192)

## 2018-09-01 LAB — TSH: TSH: 1.005 u[IU]/mL (ref 0.308–3.960)

## 2018-09-01 LAB — SEDIMENTATION RATE: Sed Rate: 37 mm/hr — ABNORMAL HIGH (ref 0–22)

## 2018-09-01 NOTE — Telephone Encounter (Signed)
Scheduled appt per 8/31 los.  Patient aware of her phone visit appt date and time.

## 2018-09-02 LAB — FOLATE RBC
Folate, Hemolysate: 238 ng/mL
Folate, RBC: 944 ng/mL (ref >498)
Hematocrit: 25.2 % — ABNORMAL LOW (ref 34.0–46.6)

## 2018-09-02 LAB — KAPPA/LAMBDA LIGHT CHAINS
Kappa free light chain: 93.4 mg/L — ABNORMAL HIGH (ref 3.3–19.4)
Kappa, lambda light chain ratio: 4.53 — ABNORMAL HIGH (ref 0.26–1.65)
Lambda free light chains: 20.6 mg/L (ref 5.7–26.3)

## 2018-09-02 LAB — HAPTOGLOBIN: Haptoglobin: 84 mg/dL (ref 33–346)

## 2018-09-03 LAB — MULTIPLE MYELOMA PANEL, SERUM
Albumin SerPl Elph-Mcnc: 3.7 g/dL (ref 2.9–4.4)
Albumin/Glob SerPl: 1.1 (ref 0.7–1.7)
Alpha 1: 0.2 g/dL (ref 0.0–0.4)
Alpha2 Glob SerPl Elph-Mcnc: 0.7 g/dL (ref 0.4–1.0)
B-Globulin SerPl Elph-Mcnc: 0.7 g/dL (ref 0.7–1.3)
Gamma Glob SerPl Elph-Mcnc: 2 g/dL — ABNORMAL HIGH (ref 0.4–1.8)
Globulin, Total: 3.6 g/dL (ref 2.2–3.9)
IgA: 39 mg/dL — ABNORMAL LOW (ref 87–352)
IgG (Immunoglobin G), Serum: 2173 mg/dL — ABNORMAL HIGH (ref 586–1602)
IgM (Immunoglobulin M), Srm: 427 mg/dL — ABNORMAL HIGH (ref 26–217)
M Protein SerPl Elph-Mcnc: 1.5 g/dL — ABNORMAL HIGH
Total Protein ELP: 7.3 g/dL (ref 6.0–8.5)

## 2018-09-03 LAB — COPPER, SERUM: Copper: 98 ug/dL (ref 72–166)

## 2018-09-03 LAB — VITAMIN B1: Vitamin B1 (Thiamine): 81.4 nmol/L (ref 66.5–200.0)

## 2018-09-08 NOTE — Progress Notes (Signed)
Not my patient

## 2018-09-10 ENCOUNTER — Inpatient Hospital Stay: Payer: Commercial Managed Care - PPO | Attending: Hematology | Admitting: Hematology

## 2018-09-10 ENCOUNTER — Other Ambulatory Visit: Payer: Self-pay

## 2018-09-10 DIAGNOSIS — Z9884 Bariatric surgery status: Secondary | ICD-10-CM | POA: Insufficient documentation

## 2018-09-10 DIAGNOSIS — D508 Other iron deficiency anemias: Secondary | ICD-10-CM | POA: Diagnosis not present

## 2018-09-10 DIAGNOSIS — Z791 Long term (current) use of non-steroidal anti-inflammatories (NSAID): Secondary | ICD-10-CM | POA: Insufficient documentation

## 2018-09-10 DIAGNOSIS — Z79899 Other long term (current) drug therapy: Secondary | ICD-10-CM | POA: Insufficient documentation

## 2018-09-10 DIAGNOSIS — D509 Iron deficiency anemia, unspecified: Secondary | ICD-10-CM | POA: Insufficient documentation

## 2018-09-10 DIAGNOSIS — Z23 Encounter for immunization: Secondary | ICD-10-CM | POA: Insufficient documentation

## 2018-09-10 DIAGNOSIS — D472 Monoclonal gammopathy: Secondary | ICD-10-CM | POA: Diagnosis not present

## 2018-09-10 DIAGNOSIS — Z87891 Personal history of nicotine dependence: Secondary | ICD-10-CM | POA: Insufficient documentation

## 2018-09-10 DIAGNOSIS — D892 Hypergammaglobulinemia, unspecified: Secondary | ICD-10-CM | POA: Insufficient documentation

## 2018-09-10 MED ORDER — CYANOCOBALAMIN 1000 MCG/ML IJ SOLN: 1000.0000 ug | INTRAMUSCULAR | 0 refills | Status: DC

## 2018-09-10 NOTE — Progress Notes (Signed)
HEMATOLOGY/ONCOLOGY CONSULTATION NOTE  Date of Service: 09/10/2018  Patient Care Team: Tyson Dense, MD as PCP - General (Obstetrics and Gynecology)  CHIEF COMPLAINTS/PURPOSE OF CONSULTATION:  Anemia  HISTORY OF PRESENTING ILLNESS:   Debra Jacobson is a wonderful 58 y.o. female who has been referred to [place] by Dr Lucillie Garfinkel for evaluation and management of anemia. The pt reports that she is doing well overall.  The pt reports that she was first given the diagnoses of anemia about 10 years ago. Pt is currently taking 1 iron pill every other day. Pt had a gastric bypass surgery in 2014. She initially weighed about 400 lbs and is currently 164 lbs. She has been experiencing a severe lack of appetite and weight loss. When she does attempt to eat she can only take a couple of bites and then has a bowel movement soon after that. She also has abdominal pain after she eats. Pt has seasonal allergies and eczema, for which she has been taking Dupixent for 3 months. Pt has never had a skin biopsy to confirm her eczema diagnosis. It was dormant for about 10 years and came back 2 years ago after an interaction with a cat and has persisted. Pt also takes Vistaril to help with the itch. Pt has never had an allergy test or immune therapy testing. Pt took steroid shots for her carpal tunnel. There were pre-cancerous cells found in pt's pap smear and her Gynecologist is planning on doing a resection. Pt was experiencing black stools a couple of months ago, that were not painful and did not speak with her PCP about it. She had a colonoscopy in 2014 and has never had an endoscopy. Pt believes her ankle swelling is from work and wears compression socks which helps.   Most recent lab results (08/12/2018) of CBC is as follows: WBC at 3.8K, RBC at 2.49, Hgb at 7.8, HCT at 24.3, MCV at 98, MCH at 31.3, MCHC at 32.1, RDW at 15.9, Platelets at 182K.  08/12/2018 Transferrin is 286 08/12/2018 Ferritin is 30    08/12/2018 Iron and TIBC shows Iron Bind, Cap, (TIBC) at 349, UIBC at 303, Iron at 46, Iron Sat at 13.   On review of systems, pt reports pain in her left knee, weight loss, fatigue, enlarged cervical lymph nodes, black/bloody stools, abdominal pain and denies vaginal bleeds, nose bleeds gum bleeds, chest pain, SOB, fevers, chills and any other symptoms.   On PMHx the pt reports gastric bypass surgery, two cesarean sections, tubal ligation.  On Social Hx the pt reports that she has quit smoking, no alcohol use.   INTERVAL HISTORY:   I connected with  Debra Jacobson on 09/10/18 by a video enabled telemedicine application and verified that I am speaking with the correct person using two identifiers.   I discussed the limitations of evaluation and management by telemedicine. The patient expressed understanding and agreed to proceed.   Other persons participating in the visit and their role in the encounter:     -Yevette Edwards, Medical Scribe  Patient's location: Home Provider's location: [facility]  Debra Jacobson is a wonderful 58 y.o. female who is here today for evaluation and management of anemia. The patient's last visit with [place] was on 09/01/2018. The pt reports that she is doing well overall.  The pt reports that she has no new concerns at this time.  Lab results (09/01/18) of CBC w/diff and CMP is as follows: all  values are WNL except for WBC at 3.5K, RBC at 2.49, Hgb at 7.9, HCT at 25.5, MCV at 102.4, RDW at 18.5, Calcium at 8.4, AST at 60.  09/01/2018 LDH at 220 09/01/2018 Ferritin at 17 09/01/2018 Vitamin B12 at 139 09/01/2018 Vitamin B1 at 81.4 09/01/2018 Haptoglobin at 84 09/01/2018 Sed rate at 37 09/01/2018 TSH at 1.005 09/01/2018 Copper at 98 09/01/2018 Iron and TIBC is as follows: Iron at 37, TIBC at 358, Sat Ratios at 10, UIBC at 321. 09/01/2018 Folate RBC is as follows: Folate Hemolysate at 238.0, Hematocrit at 25.2, Folate RBC at 944.  09/01/2018  K/L light chains is as follows: Kappa free light chain at 93.4, Lamda free light chains at 20.6, Kappa lamda light chain ratio at 4.53 09/01/2018 MMP is as follows: IgG at 2173, IgA at 39, IgM at 427, Total Protein ELP at 7.3, Albumin SerPl Elph-Mcnc at 3.7, Alpha 1 at 0.2, Alpha2 Glob SerPl Elph-Mcnc 0.7, B-Globulin SerPl Elph-Mcnc 0.7, Gamma Glob SerPl Elph-Mcnc at 2.0, M Protein SerPl Elph-Mcnc at 1.5, Total Globulin at 3.6, Albumin/Glab SerPl at 1.1, IFE 1 states "Immunofixation shows IgG monoclonal protein with kappa light chain specificity". 09/01/2018 Reticulocytes is as follows: Retic Ct Pct at 1.6, RBC at 2.49, Retic Count Abs at 38.8, Immature Retic Fract at 15.7   On review of systems, pt any other symptoms.    MEDICAL HISTORY:  Past Medical History:  Diagnosis Date   Allergy    all rhinitis   Dependent edema    Dermatitis    GERD (gastroesophageal reflux disease)    Hypertension     SURGICAL HISTORY: Past Surgical History:  Procedure Laterality Date   CESAREAN SECTION     x2   TUBAL LIGATION      SOCIAL HISTORY: Social History   Socioeconomic History   Marital status: Married    Spouse name: Not on file   Number of children: Not on file   Years of education: Not on file   Highest education level: Not on file  Occupational History   Not on file  Social Needs   Financial resource strain: Not on file   Food insecurity    Worry: Not on file    Inability: Not on file   Transportation needs    Medical: Not on file    Non-medical: Not on file  Tobacco Use   Smoking status: Former Smoker    Types: Cigarettes    Quit date: 08/29/1991    Years since quitting: 27.0   Smokeless tobacco: Never Used  Substance and Sexual Activity   Alcohol use: No   Drug use: No   Sexual activity: Not on file  Lifestyle   Physical activity    Days per week: Not on file    Minutes per session: Not on file   Stress: Not on file  Relationships   Social  connections    Talks on phone: Not on file    Gets together: Not on file    Attends religious service: Not on file    Active member of club or organization: Not on file    Attends meetings of clubs or organizations: Not on file    Relationship status: Not on file   Intimate partner violence    Fear of current or ex partner: Not on file    Emotionally abused: Not on file    Physically abused: Not on file    Forced sexual activity: Not on file  Other Topics Concern  Not on file  Social History Narrative   Not on file    FAMILY HISTORY: Family History  Problem Relation Age of Onset   Hyperlipidemia Mother     ALLERGIES:  has No Known Allergies.  MEDICATIONS:  Current Outpatient Medications  Medication Sig Dispense Refill   ibuprofen (ADVIL,MOTRIN) 800 MG tablet Take 1 tablet (800 mg total) by mouth 3 (three) times daily. With food 21 tablet 0   methocarbamol (ROBAXIN) 500 MG tablet Take 1 tablet (500 mg total) by mouth 2 (two) times daily. 20 tablet 0   oxyCODONE (ROXICODONE) 5 MG immediate release tablet Take 1 tablet (5 mg total) by mouth every 6 (six) hours as needed for severe pain. 15 tablet 0   triamcinolone cream (KENALOG) 0.1 % Apply 1 application topically 4 (four) times daily as needed. (Patient not taking: Reported on 03/08/2017) 30 g 0   No current facility-administered medications for this visit.     REVIEW OF SYSTEMS:    A 10+ POINT REVIEW OF SYSTEMS WAS OBTAINED including neurology, dermatology, psychiatry, cardiac, respiratory, lymph, extremities, GI, GU, Musculoskeletal, constitutional, breasts, reproductive, HEENT.  All pertinent positives are noted in the HPI.  All others are negative.   PHYSICAL EXAMINATION: ECOG PERFORMANCE STATUS: 1 - Symptomatic but completely ambulatory  . There were no vitals filed for this visit. There were no vitals filed for this visit. .There is no height or weight on file to calculate BMI.  Telehealth visit    LABORATORY DATA:  I have reviewed the data as listed  . CBC Latest Ref Rng & Units 09/01/2018 09/01/2018 03/09/2017  WBC 4.0 - 10.5 K/uL 3.5(L) - 3.9(L)  Hemoglobin 12.0 - 15.0 g/dL 7.9(L) - 9.4(L)  Hematocrit 34.0 - 46.6 % 25.2(L) 25.5(L) 28.9(L)  Platelets 150 - 400 K/uL 202 - 185   . CBC    Component Value Date/Time   WBC 3.5 (L) 09/01/2018 1222   RBC 2.49 (L) 09/01/2018 1222   RBC 2.49 (L) 09/01/2018 1222   HGB 7.9 (L) 09/01/2018 1222   HCT 25.2 (L) 09/01/2018 1224   PLT 202 09/01/2018 1222   MCV 102.4 (H) 09/01/2018 1222   MCH 31.7 09/01/2018 1222   MCHC 31.0 09/01/2018 1222   RDW 18.5 (H) 09/01/2018 1222   LYMPHSABS 1.1 09/01/2018 1222   MONOABS 0.4 09/01/2018 1222   EOSABS 0.1 09/01/2018 1222   BASOSABS 0.0 09/01/2018 1222     . CMP Latest Ref Rng & Units 09/01/2018 03/09/2017 09/10/2011  Glucose 70 - 99 mg/dL 72 123(H) 80  BUN 6 - 20 mg/dL '11 12 12  '$ Creatinine 0.44 - 1.00 mg/dL 0.78 0.96 0.74  Sodium 135 - 145 mmol/L 139 136 141  Potassium 3.5 - 5.1 mmol/L 3.9 4.2 4.1  Chloride 98 - 111 mmol/L 109 109 108  CO2 22 - 32 mmol/L '23 22 30  '$ Calcium 8.9 - 10.3 mg/dL 8.4(L) 8.2(L) 9.4  Total Protein 6.5 - 8.1 g/dL 8.0 - 7.7  Total Bilirubin 0.3 - 1.2 mg/dL 0.3 - 0.4  Alkaline Phos 38 - 126 U/L 101 - 60  AST 15 - 41 U/L 60(H) - 26  ALT 0 - 44 U/L 37 - 19   . Lab Results  Component Value Date   IRON 37 (L) 09/01/2018   TIBC 358 09/01/2018   IRONPCTSAT 10 (L) 09/01/2018   (Iron and TIBC)  Lab Results  Component Value Date   FERRITIN 17 09/01/2018      RADIOGRAPHIC STUDIES: I have personally  reviewed the radiological images as listed and agreed with the findings in the report. US Breast Ltd Uni Left Inc Axilla  Result Date: 08/15/2018 CLINICAL DATA:  58 year old female presenting for evaluation of palpable lumps in the left breast at 10, 12 and 2 o'clock. EXAM: DIGITAL DIAGNOSTIC BILATERAL MAMMOGRAM WITH CAD AND TOMO ULTRASOUND LEFT BREAST COMPARISON:   Previous exam(s). ACR Breast Density Category b: There are scattered areas of fibroglandular density. FINDINGS: BBs have been placed at the 10 and 2 o'clock position in the posterior left breast indicating to the 3 palpable sites of concern. No suspicious masses are seen deep to the palpable markers. At the 12 o'clock position, posterior depth there is a 3 mm oval circumscribed mass. No suspicious calcifications, masses or areas of distortion are seen in the bilateral breasts. Mammographic images were processed with CAD. On physical exam, there is a tiny superficial mass palpated at the 12 o'clock position in the left breast. No suspicious masses are identified in the upper-outer or upper inner quadrants of the left breast. Ultrasound of the left breast at 12 o'clock, 7 cm from the nipple demonstrates a superficial circumscribed oval mass measuring 3 x 2 x 3 mm. No suspicious findings are seen in the upper outer or upper inner quadrants of the left breast. IMPRESSION: 1. A benign cyst is seen at the palpable site in the left breast at 12 o'clock. 2. No mammographic or targeted sonographic abnormalities are seen in the upper outer or upper inner quadrants of the left breast at the palpable sites of concern. 3.  No mammographic evidence of malignancy in the bilateral breasts. RECOMMENDATION: 1. Clinical follow-up recommended for the palpable areas of concern in the bilateral breasts. Any further workup should be based on clinical grounds. 2.  Screening mammogram in one year.(Code:SM-B-01Y) I have discussed the findings and recommendations with the patient. Results were also provided in writing at the conclusion of the visit. If applicable, a reminder letter will be sent to the patient regarding the next appointment. BI-RADS CATEGORY  2: Benign. Electronically Signed   By: Ammie Ferrier M.D.   On: 08/15/2018 09:47   Mm Diag Breast Tomo Bilateral  Result Date: 08/15/2018 CLINICAL DATA:  58 year old female  presenting for evaluation of palpable lumps in the left breast at 10, 12 and 2 o'clock. EXAM: DIGITAL DIAGNOSTIC BILATERAL MAMMOGRAM WITH CAD AND TOMO ULTRASOUND LEFT BREAST COMPARISON:  Previous exam(s). ACR Breast Density Category b: There are scattered areas of fibroglandular density. FINDINGS: BBs have been placed at the 10 and 2 o'clock position in the posterior left breast indicating to the 3 palpable sites of concern. No suspicious masses are seen deep to the palpable markers. At the 12 o'clock position, posterior depth there is a 3 mm oval circumscribed mass. No suspicious calcifications, masses or areas of distortion are seen in the bilateral breasts. Mammographic images were processed with CAD. On physical exam, there is a tiny superficial mass palpated at the 12 o'clock position in the left breast. No suspicious masses are identified in the upper-outer or upper inner quadrants of the left breast. Ultrasound of the left breast at 12 o'clock, 7 cm from the nipple demonstrates a superficial circumscribed oval mass measuring 3 x 2 x 3 mm. No suspicious findings are seen in the upper outer or upper inner quadrants of the left breast. IMPRESSION: 1. A benign cyst is seen at the palpable site in the left breast at 12 o'clock. 2. No mammographic or targeted sonographic abnormalities  are seen in the upper outer or upper inner quadrants of the left breast at the palpable sites of concern. 3.  No mammographic evidence of malignancy in the bilateral breasts. RECOMMENDATION: 1. Clinical follow-up recommended for the palpable areas of concern in the bilateral breasts. Any further workup should be based on clinical grounds. 2.  Screening mammogram in one year.(Code:SM-B-01Y) I have discussed the findings and recommendations with the patient. Results were also provided in writing at the conclusion of the visit. If applicable, a reminder letter will be sent to the patient regarding the next appointment. BI-RADS CATEGORY   2: Benign. Electronically Signed   By: Ammie Ferrier M.D.   On: 08/15/2018 09:47    ASSESSMENT & PLAN:   1) Cannot r/o RBC Macrocytosis, likely due to Vitamin B12 deficiency caused by Gastric bypass surgery - must r/o bone marrow infiltrative process due to MM  2) Iron deficiency- GI blood loss vs poor absorption related to Gastric bypass surgery. 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  -Discussed 09/01/2018 LDH at 220, LDH is high, most likely due to B12 deficiency -Discussed 09/01/2018 Ferritin at 17, Goal is >100 due to previous gastric bypass -Discussed 09/01/2018 Vitamin B12 at 139, Goal is >400 -Discussed 09/01/2018 Vitamin B1 at 81.4 -Discussed 09/01/2018 Haptoglobin at 84, Haptoglobin normalcy suggests against hemolysis  -Discussed 09/01/2018 Sed rate at 37 -Discussed 09/01/2018 TSH at 1.005 -Discussed 09/01/2018 Copper at 98 -Discussed 09/01/2018 Iron and TIBC is as follows: Iron at 37, TIBC at 358, Sat Ratios at 10, UIBC at 321. Goal Sat Ratios >100 -Discussed 09/01/2018 Folate RBC is as follows: Folate Hemolysate at 238.0, Hematocrit at 25.2, Folate RBC at 944.  -Discussed 09/01/2018 K/L light chains is as follows: Kappa free light chain at 93.4, Lamda free light chains at 20.6, Kappa lamda light chain ratio at 4.53 -Discussed 09/01/2018 MMP is as follows: IgG at 2173, IgA at 39, IgM at 427, Total Protein ELP at 7.3, Albumin SerPl Elph-Mcnc at 3.7, Alpha 1 at 0.2, Alpha2 Glob SerPl Elph-Mcnc 0.7, B-Globulin SerPl Elph-Mcnc 0.7, Gamma Glob SerPl Elph-Mcnc at 2.0, M Protein SerPl Elph-Mcnc at 1.5, Total Globulin at 3.6, Albumin/Glab SerPl at 1.1, IFE 1 states "Immunofixation shows IgG monoclonal protein with kappa light chain specificity." -Discussed 09/01/2018 Reticulocytes is as  follows: Retic Ct Pct at 1.6, RBC at 2.49, Retic Count Abs at 38.8, Immature Retic Fract at 15.7 -Pt does not want a blood transfusion, even under life threatening conditions (was a previous Jehovas Witness). -Rx Vitamn B12 subcutaneous shots once a week for a month, then monthly -Will Schedule IV Injectafer 2x, 1 week apart  -Will order UPEP, PET/CT scan, BM BX  due to M protein spike at 1.5 to r/o Multiple Myeloma -Will see back in 6 weeks with labs  FOLLOW UP: IV Injectafer weekly x 2 doses ASAP  B12 inj --patient to self administer - prescription sent to her pharmacy PET/CT in 1-2 week for concern for myeloma Labs for 24h UPEP ASAP CT bone marrow biopsy in 1-2 weeks RTC with Dr Irene Limbo with labs in 6 weeks  The total time spent in the appt was 25 minutes and more than 50% was on counseling and direct patient cares.  All of the patient's questions were answered with  apparent satisfaction. The patient knows to call the clinic with any problems, questions or concerns.    Sullivan Lone MD [place] AAHIVMS [facility] Hematology/Oncology Physician [facility]  (Office):       [phone] (Work cell):  [phone] (Fax):           [phone]  09/10/2018 5:41 AM  I, Yevette Edwards, am acting as a scribe for Dr. Sullivan Lone.   .I have reviewed the above documentation for accuracy and completeness, and I agree with the above. Brunetta Genera MD

## 2018-09-11 ENCOUNTER — Telehealth: Payer: Self-pay | Admitting: Hematology

## 2018-09-11 NOTE — Telephone Encounter (Signed)
Scheduled appt per 9/9 los.  Spoke with patient and she is aware of her appt date and time  Patient stated she will get her urine jug from lab one day this week possible tomorrow 9/11.

## 2018-09-17 ENCOUNTER — Other Ambulatory Visit: Payer: Self-pay

## 2018-09-17 ENCOUNTER — Inpatient Hospital Stay: Payer: Commercial Managed Care - PPO

## 2018-09-17 ENCOUNTER — Other Ambulatory Visit: Payer: Self-pay | Admitting: *Deleted

## 2018-09-17 VITALS — BP 129/91 | HR 74 | Temp 98.7°F | Resp 17

## 2018-09-17 DIAGNOSIS — Z87891 Personal history of nicotine dependence: Secondary | ICD-10-CM | POA: Diagnosis not present

## 2018-09-17 DIAGNOSIS — D508 Other iron deficiency anemias: Secondary | ICD-10-CM | POA: Diagnosis present

## 2018-09-17 DIAGNOSIS — Z9884 Bariatric surgery status: Secondary | ICD-10-CM

## 2018-09-17 DIAGNOSIS — D892 Hypergammaglobulinemia, unspecified: Secondary | ICD-10-CM | POA: Diagnosis not present

## 2018-09-17 DIAGNOSIS — Z23 Encounter for immunization: Secondary | ICD-10-CM | POA: Diagnosis not present

## 2018-09-17 DIAGNOSIS — Z79899 Other long term (current) drug therapy: Secondary | ICD-10-CM | POA: Diagnosis not present

## 2018-09-17 DIAGNOSIS — Z791 Long term (current) use of non-steroidal anti-inflammatories (NSAID): Secondary | ICD-10-CM | POA: Diagnosis not present

## 2018-09-17 LAB — OCCULT BLOOD X 1 CARD TO LAB, STOOL
Fecal Occult Bld: POSITIVE — AB
Fecal Occult Bld: POSITIVE — AB
Fecal Occult Bld: NEGATIVE

## 2018-09-17 MED ORDER — ACETAMINOPHEN 325 MG PO TABS
ORAL_TABLET | ORAL | Status: AC
  Filled 2018-09-17: qty 2

## 2018-09-17 MED ORDER — SODIUM CHLORIDE 0.9 % IV SOLN
Freq: Once | INTRAVENOUS | Status: AC
  Administered 2018-09-17: 10:00:00 via INTRAVENOUS
  Filled 2018-09-17: qty 250

## 2018-09-17 MED ORDER — ACETAMINOPHEN 325 MG PO TABS
650.0000 mg | ORAL_TABLET | Freq: Once | ORAL | Status: AC
  Administered 2018-09-17: 650 mg via ORAL

## 2018-09-17 MED ORDER — LORATADINE 10 MG PO TABS
10.0000 mg | ORAL_TABLET | Freq: Once | ORAL | Status: AC
  Administered 2018-09-17: 10:00:00 10 mg via ORAL

## 2018-09-17 MED ORDER — LORATADINE 10 MG PO TABS
ORAL_TABLET | ORAL | Status: AC
  Filled 2018-09-17: qty 1

## 2018-09-17 MED ORDER — SODIUM CHLORIDE 0.9 % IV SOLN
200.0000 mg | Freq: Once | INTRAVENOUS | Status: AC
  Administered 2018-09-17: 200 mg via INTRAVENOUS
  Filled 2018-09-17: qty 10

## 2018-09-17 NOTE — Patient Instructions (Signed)

## 2018-09-18 LAB — UPEP/UIFE/LIGHT CHAINS/TP, 24-HR UR
% BETA, Urine: 0 %
ALPHA 1 URINE: 0 %
Albumin, U: 0 %
Alpha 2, Urine: 0 %
Free Kappa Lt Chains,Ur: 9.2 mg/L (ref 0.63–113.79)
Free Kappa/Lambda Ratio: >14.38 (ref 1.03–31.76)
Free Lambda Lt Chains,Ur: <0.64 mg/L (ref 0.47–11.77)
GAMMA GLOBULIN URINE: 0 %
Total Protein, Urine-Ur/day: <76 mg/24 hr (ref 30–150)
Total Protein, Urine: <4 mg/dL

## 2018-09-19 ENCOUNTER — Other Ambulatory Visit: Payer: Self-pay | Admitting: Radiology

## 2018-09-22 ENCOUNTER — Ambulatory Visit (HOSPITAL_COMMUNITY)
Admission: RE | Admit: 2018-09-22 | Discharge: 2018-09-22 | Disposition: A | Discharge status: Home / Self Care | Payer: Commercial Managed Care - PPO | Source: Ambulatory Visit | Attending: Hematology | Admitting: Hematology

## 2018-09-22 ENCOUNTER — Other Ambulatory Visit: Payer: Self-pay

## 2018-09-22 ENCOUNTER — Encounter (HOSPITAL_COMMUNITY): Payer: Self-pay

## 2018-09-22 DIAGNOSIS — Z87891 Personal history of nicotine dependence: Secondary | ICD-10-CM | POA: Insufficient documentation

## 2018-09-22 DIAGNOSIS — Z79899 Other long term (current) drug therapy: Secondary | ICD-10-CM | POA: Diagnosis not present

## 2018-09-22 DIAGNOSIS — D472 Monoclonal gammopathy: Secondary | ICD-10-CM | POA: Insufficient documentation

## 2018-09-22 DIAGNOSIS — D539 Nutritional anemia, unspecified: Secondary | ICD-10-CM | POA: Diagnosis not present

## 2018-09-22 DIAGNOSIS — D72819 Decreased white blood cell count, unspecified: Secondary | ICD-10-CM | POA: Diagnosis not present

## 2018-09-22 LAB — CBC WITH DIFFERENTIAL/PLATELET
Abs Immature Granulocytes: 0 10*3/uL (ref 0.00–0.07)
Basophils Absolute: 0 10*3/uL (ref 0.0–0.1)
Basophils Relative: 1 %
Eosinophils Absolute: 0.1 10*3/uL (ref 0.0–0.5)
Eosinophils Relative: 2 %
HCT: 25.4 % — ABNORMAL LOW (ref 36.0–46.0)
Hemoglobin: 7.7 g/dL — ABNORMAL LOW (ref 12.0–15.0)
Immature Granulocytes: 0 %
Lymphocytes Relative: 40 %
Lymphs Abs: 1.4 10*3/uL (ref 0.7–4.0)
MCH: 31.6 pg (ref 26.0–34.0)
MCHC: 30.3 g/dL (ref 30.0–36.0)
MCV: 104.1 fL — ABNORMAL HIGH (ref 80.0–100.0)
Monocytes Absolute: 0.4 10*3/uL (ref 0.1–1.0)
Monocytes Relative: 10 %
Neutro Abs: 1.7 10*3/uL (ref 1.7–7.7)
Neutrophils Relative %: 47 %
Platelets: 223 10*3/uL (ref 150–400)
RBC: 2.44 MIL/uL — ABNORMAL LOW (ref 3.87–5.11)
RDW: 20.6 % — ABNORMAL HIGH (ref 11.5–15.5)
WBC: 3.6 10*3/uL — ABNORMAL LOW (ref 4.0–10.5)
nRBC: 0 % (ref 0.0–0.2)

## 2018-09-22 LAB — PROTIME-INR
INR: 1.3 — ABNORMAL HIGH (ref 0.8–1.2)
Prothrombin Time: 16.1 seconds — ABNORMAL HIGH (ref 11.4–15.2)

## 2018-09-22 LAB — APTT: aPTT: 40 seconds — ABNORMAL HIGH (ref 24–36)

## 2018-09-22 MED ORDER — MIDAZOLAM HCL 2 MG/2ML IJ SOLN
INTRAMUSCULAR | Status: AC
  Filled 2018-09-22: qty 4

## 2018-09-22 MED ORDER — FENTANYL CITRATE (PF) 100 MCG/2ML IJ SOLN
INTRAMUSCULAR | Status: AC
  Filled 2018-09-22: qty 4

## 2018-09-22 MED ORDER — NALOXONE HCL 0.4 MG/ML IJ SOLN
INTRAMUSCULAR | Status: AC
  Filled 2018-09-22: qty 1

## 2018-09-22 MED ORDER — FLUMAZENIL 0.5 MG/5ML IV SOLN
INTRAVENOUS | Status: AC
  Filled 2018-09-22: qty 5

## 2018-09-22 MED ORDER — SODIUM CHLORIDE 0.9 % IV SOLN
INTRAVENOUS | Status: DC
  Administered 2018-09-22: 10:00:00 via INTRAVENOUS

## 2018-09-22 MED ORDER — LIDOCAINE HCL (PF) 1 % IJ SOLN
INTRAMUSCULAR | Status: AC | PRN
  Administered 2018-09-22: 10 mL

## 2018-09-22 MED ORDER — MIDAZOLAM HCL 2 MG/2ML IJ SOLN
INTRAMUSCULAR | Status: AC | PRN
  Administered 2018-09-22 (×2): 1 mg via INTRAVENOUS

## 2018-09-22 MED ORDER — FENTANYL CITRATE (PF) 100 MCG/2ML IJ SOLN
INTRAMUSCULAR | Status: AC | PRN
  Administered 2018-09-22 (×2): 50 ug via INTRAVENOUS

## 2018-09-22 NOTE — Consult Note (Signed)
Chief Complaint: Patient was seen in consultation today for CT guided bone marrow biopsy  Referring Physician(s): Brunetta Genera  Supervising Physician: Jacqulynn Cadet  Patient Status: [facility] - Out-pt  History of Present Illness: Debra Jacobson is a 58 y.o. female with history of monoclonal paraproteinemia and anemia/iron deficiency who presents today for CT-guided bone marrow biopsy for further evaluation/rule out myeloma.  Past Medical History:  Diagnosis Date  . Allergy    all rhinitis  . Dependent edema   . Dermatitis   . GERD (gastroesophageal reflux disease)   . Hypertension     Past Surgical History:  Procedure Laterality Date  . CESAREAN SECTION     x2  . TUBAL LIGATION      Allergies: Patient has no known allergies.  Medications: Prior to Admission medications   Medication Sig Start Date End Date Taking? Authorizing Provider  clobetasol cream (TEMOVATE) 6.28 % Apply 1 application topically 2 (two) times daily.   Yes [provider]  cyanocobalamin (,VITAMIN B-12,) 1000 MCG/ML injection Inject 1 mL (1,000 mcg total) into the skin every 30 (thirty) days. Start with 1000 mcg Zimmerman weekly x 4 doses then switch to every 30 days 09/10/18  Yes Brunetta Genera, MD  dupilumab (DUPIXENT) 200 MG/1.14ML prefilled syringe Inject 300 mg into the skin every 14 (fourteen) days.   Yes [provider]  ferrous sulfate 325 (65 FE) MG EC tablet Take 325 mg by mouth 2 (two) times daily.   Yes [provider]  hydrOXYzine (VISTARIL) 50 MG capsule Take 50 mg by mouth every evening.   Yes [provider]  ibuprofen (ADVIL,MOTRIN) 800 MG tablet Take 1 tablet (800 mg total) by mouth 3 (three) times daily. With food 03/09/17  Yes Muthersbaugh, Jarrett Soho, PA-C  methocarbamol (ROBAXIN) 500 MG tablet Take 1 tablet (500 mg total) by mouth 2 (two) times daily. 03/09/17   Muthersbaugh, Jarrett Soho, PA-C  oxyCODONE (ROXICODONE) 5 MG immediate release tablet Take  1 tablet (5 mg total) by mouth every 6 (six) hours as needed for severe pain. 03/09/17   Muthersbaugh, Jarrett Soho, PA-C  triamcinolone cream (KENALOG) 0.1 % Apply 1 application topically 4 (four) times daily as needed. Patient not taking: Reported on 03/08/2017 05/22/16   Clayton Bibles, PA-C     Family History  Problem Relation Age of Onset  . Hyperlipidemia Mother     Social History   Socioeconomic History  . Marital status: Married    Spouse name: Not on file  . Number of children: Not on file  . Years of education: Not on file  . Highest education level: Not on file  Occupational History  . Not on file  Social Needs  . Financial resource strain: Not on file  . Food insecurity    Worry: Not on file    Inability: Not on file  . Transportation needs    Medical: Not on file    Non-medical: Not on file  Tobacco Use  . Smoking status: Former Smoker    Types: Cigarettes    Quit date: 08/29/1991    Years since quitting: 27.0  . Smokeless tobacco: Never Used  Substance and Sexual Activity  . Alcohol use: No  . Drug use: No  . Sexual activity: Not on file  Lifestyle  . Physical activity    Days per week: Not on file    Minutes per session: Not on file  . Stress: Not on file  Relationships  . Social connections  Talks on phone: Not on file    Gets together: Not on file    Attends religious service: Not on file    Active member of club or organization: Not on file    Attends meetings of clubs or organizations: Not on file    Relationship status: Not on file  Other Topics Concern  . Not on file  Social History Narrative  . Not on file      Review of Systems currently denies fever, headache, chest pain, dyspnea,  abdominal/back pain, nausea, vomiting or bleeding. She does have occ cough.   Vital Signs: Blood pressure 125/83, temp 97.8, heart rate 71, respirations 16, O2 sats 99% room air LMP 05/01/2012   Physical Exam awake, alert.  Chest clear to auscultation bilaterally.   Heart with regular rate and rhythm.  Abdomen soft, positive bowel sounds, nontender.  Bilateral pretibial edema noted.  Imaging: No results found.  Labs:  CBC: Recent Labs    09/01/18 1222 09/01/18 1224 09/22/18 1006  WBC 3.5*  --  3.6*  HGB 7.9*  --  7.7*  HCT 25.5* 25.2* 25.4*  PLT 202  --  223    COAGS: No results for input(s): INR, APTT in the last 8760 hours.  BMP: Recent Labs    09/01/18 1222  NA 139  K 3.9  CL 109  CO2 23  GLUCOSE 72  BUN 11  CALCIUM 8.4*  CREATININE 0.78  GFRNONAA >60  GFRAA >60    LIVER FUNCTION TESTS: Recent Labs    09/01/18 1222  BILITOT 0.3  AST 60*  ALT 37  ALKPHOS 101  PROT 8.0  ALBUMIN 3.7    TUMOR MARKERS: No results for input(s): AFPTM, CEA, CA199, CHROMGRNA in the last 8760 hours.  Assessment and Plan: 58 y.o. female with history of monoclonal paraproteinemia and anemia/iron deficiency who presents today for CT-guided bone marrow biopsy for further evaluation/rule out myeloma.Risks and benefits of procedure was discussed with the patient  including, but not limited to bleeding, infection, damage to adjacent structures or low yield requiring additional tests.  All of the questions were answered and there is agreement to proceed.  Consent signed and in chart.     Thank you for this interesting consult.  I greatly enjoyed meeting COURTLAND COPPA and look forward to participating in their care.  A copy of this report was sent to the requesting provider on this date.  Electronically Signed: D. Rowe Robert, PA-C 09/22/2018, 10:46 AM   I spent a total of  20 minutes   in face to face in clinical consultation, greater than 50% of which was counseling/coordinating care for CT-guided bone marrow biopsy

## 2018-09-22 NOTE — Discharge Instructions (Signed)
Bone Marrow Aspiration and Bone Marrow Biopsy, Adult, Care After °This sheet gives you information about how to care for yourself after your procedure. Your health care provider may also give you more specific instructions. If you have problems or questions, contact your health care provider. °What can I expect after the procedure? °After the procedure, it is common to have: °· Mild pain and tenderness. °· Swelling. °· Bruising. °Follow these instructions at home: °Puncture site care ° °  ° °· Follow instructions from your health care provider about how to take care of the puncture site. Make sure you: °? Wash your hands with soap and water before you change your bandage (dressing). If soap and water are not available, use hand sanitizer. °? Change your dressing as told by your health care provider. °· Check your puncture site every day for signs of infection. Check for: °? More redness, swelling, or pain. °? More fluid or blood. °? Warmth. °? Pus or a bad smell. °General instructions °· Take over-the-counter and prescription medicines only as told by your health care provider. °· Do not take baths, swim, or use a hot tub until your health care provider approves. Ask if you can take a shower or have a sponge bath. °· Return to your normal activities as told by your health care provider. Ask your health care provider what activities are safe for you. °· Do not drive for 24 hours if you were given a medicine to help you relax (sedative) during your procedure. °· Keep all follow-up visits as told by your health care provider. This is important. °Contact a health care provider if: °· Your pain is not controlled with medicine. °Get help right away if: °· You have a fever. °· You have more redness, swelling, or pain around the puncture site. °· You have more fluid or blood coming from the puncture site. °· Your puncture site feels warm to the touch. °· You have pus or a bad smell coming from the puncture site. °These  symptoms may represent a serious problem that is an emergency. Do not wait to see if the symptoms will go away. Get medical help right away. Call your local emergency services (911 in the [place] Do not drive yourself to the hospital. °Summary °· After the procedure, it is common to have mild pain, tenderness, swelling, and bruising. °· Follow instructions from your health care provider about how to take care of the puncture site. °· Get help right away if you have any symptoms of infection or if you have more blood or fluid coming from the puncture site. °This information is not intended to replace advice given to you by your health care provider. Make sure you discuss any questions you have with your health care provider. °Document Released: 07/07/2004 Document Revised: 04/02/2017 Document Reviewed: 06/01/2015 °Elsevier Patient Education © 2020 Elsevier Inc. ° ° ° ° °Moderate Conscious Sedation, Adult, Care After °These instructions provide you with information about caring for yourself after your procedure. Your health care provider may also give you more specific instructions. Your treatment has been planned according to current medical practices, but problems sometimes occur. Call your health care provider if you have any problems or questions after your procedure. °What can I expect after the procedure? °After your procedure, it is common: °· To feel sleepy for several hours. °· To feel clumsy and have poor balance for several hours. °· To have poor judgment for several hours. °· To vomit if you eat too soon. °  Follow these instructions at home: °For at least 24 hours after the procedure: ° °· Do not: °? Participate in activities where you could fall or become injured. °? Drive. °? Use heavy machinery. °? Drink alcohol. °? Take sleeping pills or medicines that cause drowsiness. °? Make important decisions or sign legal documents. °? Take care of children on your own. °· Rest. °Eating and drinking °· Follow the  diet recommended by your health care provider. °· If you vomit: °? Drink water, juice, or soup when you can drink without vomiting. °? Make sure you have little or no nausea before eating solid foods. °General instructions °· Have a responsible adult stay with you until you are awake and alert. °· Take over-the-counter and prescription medicines only as told by your health care provider. °· If you smoke, do not smoke without supervision. °· Keep all follow-up visits as told by your health care provider. This is important. °Contact a health care provider if: °· You keep feeling nauseous or you keep vomiting. °· You feel light-headed. °· You develop a rash. °· You have a fever. °Get help right away if: °· You have trouble breathing. °This information is not intended to replace advice given to you by your health care provider. Make sure you discuss any questions you have with your health care provider. °Document Released: 10/08/2012 Document Revised: 11/30/2016 Document Reviewed: 04/09/2015 °Elsevier Patient Education © 2020 Elsevier Inc. ° °

## 2018-09-22 NOTE — Procedures (Signed)
Interventional Radiology Procedure Note  Procedure: CT guided aspirate and core biopsy of right iliac bone Complications: None Recommendations: - Bedrest supine x 1 hrs - Hydrocodone PRN  Pain - Follow biopsy results  Signed,  Tamico Mundo K. Patrizia Paule, MD   

## 2018-09-23 ENCOUNTER — Other Ambulatory Visit: Payer: Self-pay

## 2018-09-23 ENCOUNTER — Inpatient Hospital Stay: Payer: Commercial Managed Care - PPO

## 2018-09-23 VITALS — BP 125/79 | HR 66 | Temp 97.8°F | Resp 20

## 2018-09-23 DIAGNOSIS — D508 Other iron deficiency anemias: Secondary | ICD-10-CM | POA: Diagnosis not present

## 2018-09-23 DIAGNOSIS — Z9884 Bariatric surgery status: Secondary | ICD-10-CM

## 2018-09-23 MED ORDER — SODIUM CHLORIDE 0.9 % IV SOLN
Freq: Once | INTRAVENOUS | Status: AC
  Administered 2018-09-23: 10:00:00 via INTRAVENOUS
  Filled 2018-09-23: qty 250

## 2018-09-23 MED ORDER — INFLUENZA VAC SPLIT QUAD 0.5 ML IM SUSY
PREFILLED_SYRINGE | INTRAMUSCULAR | Status: AC
  Filled 2018-09-23: qty 0.5

## 2018-09-23 MED ORDER — LORATADINE 10 MG PO TABS
10.0000 mg | ORAL_TABLET | Freq: Once | ORAL | Status: AC
  Administered 2018-09-23: 10 mg via ORAL

## 2018-09-23 MED ORDER — LORATADINE 10 MG PO TABS
ORAL_TABLET | ORAL | Status: AC
  Filled 2018-09-23: qty 1

## 2018-09-23 MED ORDER — ACETAMINOPHEN 325 MG PO TABS
ORAL_TABLET | ORAL | Status: AC
  Filled 2018-09-23: qty 2

## 2018-09-23 MED ORDER — SODIUM CHLORIDE 0.9 % IV SOLN
200.0000 mg | Freq: Once | INTRAVENOUS | Status: AC
  Administered 2018-09-23: 200 mg via INTRAVENOUS
  Filled 2018-09-23: qty 10

## 2018-09-23 MED ORDER — ACETAMINOPHEN 325 MG PO TABS
650.0000 mg | ORAL_TABLET | Freq: Once | ORAL | Status: AC
  Administered 2018-09-23: 650 mg via ORAL

## 2018-09-23 MED ORDER — INFLUENZA VAC SPLIT QUAD 0.5 ML IM SUSY
0.5000 mL | PREFILLED_SYRINGE | Freq: Once | INTRAMUSCULAR | Status: AC
  Administered 2018-09-23: 0.5 mL via INTRAMUSCULAR

## 2018-09-23 NOTE — Patient Instructions (Signed)
Iron Sucrose injection What is this medicine? IRON SUCROSE (AHY ern SOO krohs) is an iron complex. Iron is used to make healthy red blood cells, which carry oxygen and nutrients throughout the body. This medicine is used to treat iron deficiency anemia in people with chronic kidney disease. This medicine may be used for other purposes; ask your health care provider or pharmacist if you have questions. COMMON BRAND NAME(S): Venofer What should I tell my health care provider before I take this medicine? They need to know if you have any of these conditions:  anemia not caused by low iron levels  heart disease  high levels of iron in the blood  kidney disease  liver disease  an unusual or allergic reaction to iron, other medicines, foods, dyes, or preservatives  pregnant or trying to get pregnant  breast-feeding How should I use this medicine? This medicine is for infusion into a vein. It is given by a health care professional in a hospital or clinic setting. Talk to your pediatrician regarding the use of this medicine in children. While this drug may be prescribed for children as young as 2 years for selected conditions, precautions do apply. Overdosage: If you think you have taken too much of this medicine contact a poison control center or emergency room at once. NOTE: This medicine is only for you. Do not share this medicine with others. What if I miss a dose? It is important not to miss your dose. Call your doctor or health care professional if you are unable to keep an appointment. What may interact with this medicine? Do not take this medicine with any of the following medications:  deferoxamine  dimercaprol  other iron products This medicine may also interact with the following medications:  chloramphenicol  deferasirox This list may not describe all possible interactions. Give your health care provider a list of all the medicines, herbs, non-prescription drugs, or  dietary supplements you use. Also tell them if you smoke, drink alcohol, or use illegal drugs. Some items may interact with your medicine. What should I watch for while using this medicine? Visit your doctor or healthcare professional regularly. Tell your doctor or healthcare professional if your symptoms do not start to get better or if they get worse. You may need blood work done while you are taking this medicine. You may need to follow a special diet. Talk to your doctor. Foods that contain iron include: whole grains/cereals, dried fruits, beans, or peas, leafy green vegetables, and organ meats (liver, kidney). What side effects may I notice from receiving this medicine? Side effects that you should report to your doctor or health care professional as soon as possible:  allergic reactions like skin rash, itching or hives, swelling of the face, lips, or tongue  breathing problems  changes in blood pressure  cough  fast, irregular heartbeat  feeling faint or lightheaded, falls  fever or chills  flushing, sweating, or hot feelings  joint or muscle aches/pains  seizures  swelling of the ankles or feet  unusually weak or tired Side effects that usually do not require medical attention (report to your doctor or health care professional if they continue or are bothersome):  diarrhea  feeling achy  headache  irritation at site where injected  nausea, vomiting  stomach upset  tiredness This list may not describe all possible side effects. Call your doctor for medical advice about side effects. You may report side effects to FDA at 1-800-FDA-1088. Where should I keep   my medicine? This drug is given in a hospital or clinic and will not be stored at home. NOTE: This sheet is a summary. It may not cover all possible information. If you have questions about this medicine, talk to your doctor, pharmacist, or health care provider.  2020 Elsevier/Gold Standard (2010-09-28  17:14:35)  Influenza Virus Vaccine injection What is this medicine? INFLUENZA VIRUS VACCINE (in floo EN zuh VAHY ruhs vak SEEN) helps to reduce the risk of getting influenza also known as the flu. The vaccine only helps protect you against some strains of the flu. This medicine may be used for other purposes; ask your health care provider or pharmacist if you have questions. COMMON BRAND NAME(S): Afluria, Afluria Quadrivalent, Agriflu, Alfuria, FLUAD, Fluarix, Fluarix Quadrivalent, Flublok, Flublok Quadrivalent, FLUCELVAX, Flulaval, Fluvirin, Fluzone, Fluzone High-Dose, Fluzone Intradermal What should I tell my health care provider before I take this medicine? They need to know if you have any of these conditions:  bleeding disorder like hemophilia  fever or infection  Guillain-Barre syndrome or other neurological problems  immune system problems  infection with the human immunodeficiency virus (HIV) or AIDS  low blood platelet counts  multiple sclerosis  an unusual or allergic reaction to influenza virus vaccine, latex, other medicines, foods, dyes, or preservatives. Different brands of vaccines contain different allergens. Some may contain latex or eggs. Talk to your doctor about your allergies to make sure that you get the right vaccine.  pregnant or trying to get pregnant  breast-feeding How should I use this medicine? This vaccine is for injection into a muscle or under the skin. It is given by a health care professional. A copy of Vaccine Information Statements will be given before each vaccination. Read this sheet carefully each time. The sheet may change frequently. Talk to your healthcare provider to see which vaccines are right for you. Some vaccines should not be used in all age groups. Overdosage: If you think you have taken too much of this medicine contact a poison control center or emergency room at once. NOTE: This medicine is only for you. Do not share this medicine  with others. What if I miss a dose? This does not apply. What may interact with this medicine?  chemotherapy or radiation therapy  medicines that lower your immune system like etanercept, anakinra, infliximab, and adalimumab  medicines that treat or prevent blood clots like warfarin  phenytoin  steroid medicines like prednisone or cortisone  theophylline  vaccines This list may not describe all possible interactions. Give your health care provider a list of all the medicines, herbs, non-prescription drugs, or dietary supplements you use. Also tell them if you smoke, drink alcohol, or use illegal drugs. Some items may interact with your medicine. What should I watch for while using this medicine? Report any side effects that do not go away within 3 days to your doctor or health care professional. Call your health care provider if any unusual symptoms occur within 6 weeks of receiving this vaccine. You may still catch the flu, but the illness is not usually as bad. You cannot get the flu from the vaccine. The vaccine will not protect against colds or other illnesses that may cause fever. The vaccine is needed every year. What side effects may I notice from receiving this medicine? Side effects that you should report to your doctor or health care professional as soon as possible:  allergic reactions like skin rash, itching or hives, swelling of the face, lips, or tongue  Side effects that usually do not require medical attention (report to your doctor or health care professional if they continue or are bothersome):  fever  headache  muscle aches and pains  pain, tenderness, redness, or swelling at the injection site  tiredness This list may not describe all possible side effects. Call your doctor for medical advice about side effects. You may report side effects to FDA at 1-800-FDA-1088. Where should I keep my medicine? The vaccine will be given by a health care professional in a  clinic, pharmacy, doctor's office, or other health care setting. You will not be given vaccine doses to store at home. NOTE: This sheet is a summary. It may not cover all possible information. If you have questions about this medicine, talk to your doctor, pharmacist, or health care provider.  2020 Elsevier/Gold Standard (2017-11-12 08:45:43)

## 2018-09-24 LAB — SURGICAL PATHOLOGY

## 2018-09-30 ENCOUNTER — Inpatient Hospital Stay: Payer: Commercial Managed Care - PPO

## 2018-09-30 ENCOUNTER — Other Ambulatory Visit: Payer: Self-pay

## 2018-09-30 VITALS — BP 126/84 | HR 70 | Temp 98.2°F | Resp 18

## 2018-09-30 DIAGNOSIS — Z9884 Bariatric surgery status: Secondary | ICD-10-CM

## 2018-09-30 DIAGNOSIS — D508 Other iron deficiency anemias: Secondary | ICD-10-CM | POA: Diagnosis not present

## 2018-09-30 MED ORDER — LORATADINE 10 MG PO TABS
10.0000 mg | ORAL_TABLET | Freq: Once | ORAL | Status: AC
  Administered 2018-09-30: 10 mg via ORAL

## 2018-09-30 MED ORDER — LORATADINE 10 MG PO TABS
ORAL_TABLET | ORAL | Status: AC
  Filled 2018-09-30: qty 1

## 2018-09-30 MED ORDER — ACETAMINOPHEN 325 MG PO TABS
650.0000 mg | ORAL_TABLET | Freq: Once | ORAL | Status: AC
  Administered 2018-09-30: 650 mg via ORAL

## 2018-09-30 MED ORDER — ACETAMINOPHEN 325 MG PO TABS
ORAL_TABLET | ORAL | Status: AC
  Filled 2018-09-30: qty 2

## 2018-09-30 MED ORDER — SODIUM CHLORIDE 0.9 % IV SOLN
200.0000 mg | Freq: Once | INTRAVENOUS | Status: AC
  Administered 2018-09-30: 200 mg via INTRAVENOUS
  Filled 2018-09-30: qty 10

## 2018-09-30 MED ORDER — SODIUM CHLORIDE 0.9 % IV SOLN
Freq: Once | INTRAVENOUS | Status: AC
  Administered 2018-09-30: 09:00:00 via INTRAVENOUS
  Filled 2018-09-30: qty 250

## 2018-09-30 NOTE — Patient Instructions (Signed)
Iron Sucrose injection What is this medicine? IRON SUCROSE (AHY ern SOO krohs) is an iron complex. Iron is used to make healthy red blood cells, which carry oxygen and nutrients throughout the body. This medicine is used to treat iron deficiency anemia in people with chronic kidney disease. This medicine may be used for other purposes; ask your health care provider or pharmacist if you have questions. COMMON BRAND NAME(S): Venofer What should I tell my health care provider before I take this medicine? They need to know if you have any of these conditions:  anemia not caused by low iron levels  heart disease  high levels of iron in the blood  kidney disease  liver disease  an unusual or allergic reaction to iron, other medicines, foods, dyes, or preservatives  pregnant or trying to get pregnant  breast-feeding How should I use this medicine? This medicine is for infusion into a vein. It is given by a health care professional in a hospital or clinic setting. Talk to your pediatrician regarding the use of this medicine in children. While this drug may be prescribed for children as young as 2 years for selected conditions, precautions do apply. Overdosage: If you think you have taken too much of this medicine contact a poison control center or emergency room at once. NOTE: This medicine is only for you. Do not share this medicine with others. What if I miss a dose? It is important not to miss your dose. Call your doctor or health care professional if you are unable to keep an appointment. What may interact with this medicine? Do not take this medicine with any of the following medications:  deferoxamine  dimercaprol  other iron products This medicine may also interact with the following medications:  chloramphenicol  deferasirox This list may not describe all possible interactions. Give your health care provider a list of all the medicines, herbs, non-prescription drugs, or  dietary supplements you use. Also tell them if you smoke, drink alcohol, or use illegal drugs. Some items may interact with your medicine. What should I watch for while using this medicine? Visit your doctor or healthcare professional regularly. Tell your doctor or healthcare professional if your symptoms do not start to get better or if they get worse. You may need blood work done while you are taking this medicine. You may need to follow a special diet. Talk to your doctor. Foods that contain iron include: whole grains/cereals, dried fruits, beans, or peas, leafy green vegetables, and organ meats (liver, kidney). What side effects may I notice from receiving this medicine? Side effects that you should report to your doctor or health care professional as soon as possible:  allergic reactions like skin rash, itching or hives, swelling of the face, lips, or tongue  breathing problems  changes in blood pressure  cough  fast, irregular heartbeat  feeling faint or lightheaded, falls  fever or chills  flushing, sweating, or hot feelings  joint or muscle aches/pains  seizures  swelling of the ankles or feet  unusually weak or tired Side effects that usually do not require medical attention (report to your doctor or health care professional if they continue or are bothersome):  diarrhea  feeling achy  headache  irritation at site where injected  nausea, vomiting  stomach upset  tiredness This list may not describe all possible side effects. Call your doctor for medical advice about side effects. You may report side effects to FDA at 1-800-FDA-1088. Where should I keep   my medicine? This drug is given in a hospital or clinic and will not be stored at home. NOTE: This sheet is a summary. It may not cover all possible information. If you have questions about this medicine, talk to your doctor, pharmacist, or health care provider.  2020 Elsevier/Gold Standard (2010-09-28  17:14:35)  Influenza Virus Vaccine injection What is this medicine? INFLUENZA VIRUS VACCINE (in floo EN zuh VAHY ruhs vak SEEN) helps to reduce the risk of getting influenza also known as the flu. The vaccine only helps protect you against some strains of the flu. This medicine may be used for other purposes; ask your health care provider or pharmacist if you have questions. COMMON BRAND NAME(S): Afluria, Afluria Quadrivalent, Agriflu, Alfuria, FLUAD, Fluarix, Fluarix Quadrivalent, Flublok, Flublok Quadrivalent, FLUCELVAX, Flulaval, Fluvirin, Fluzone, Fluzone High-Dose, Fluzone Intradermal What should I tell my health care provider before I take this medicine? They need to know if you have any of these conditions:  bleeding disorder like hemophilia  fever or infection  Guillain-Barre syndrome or other neurological problems  immune system problems  infection with the human immunodeficiency virus (HIV) or AIDS  low blood platelet counts  multiple sclerosis  an unusual or allergic reaction to influenza virus vaccine, latex, other medicines, foods, dyes, or preservatives. Different brands of vaccines contain different allergens. Some may contain latex or eggs. Talk to your doctor about your allergies to make sure that you get the right vaccine.  pregnant or trying to get pregnant  breast-feeding How should I use this medicine? This vaccine is for injection into a muscle or under the skin. It is given by a health care professional. A copy of Vaccine Information Statements will be given before each vaccination. Read this sheet carefully each time. The sheet may change frequently. Talk to your healthcare provider to see which vaccines are right for you. Some vaccines should not be used in all age groups. Overdosage: If you think you have taken too much of this medicine contact a poison control center or emergency room at once. NOTE: This medicine is only for you. Do not share this medicine  with others. What if I miss a dose? This does not apply. What may interact with this medicine?  chemotherapy or radiation therapy  medicines that lower your immune system like etanercept, anakinra, infliximab, and adalimumab  medicines that treat or prevent blood clots like warfarin  phenytoin  steroid medicines like prednisone or cortisone  theophylline  vaccines This list may not describe all possible interactions. Give your health care provider a list of all the medicines, herbs, non-prescription drugs, or dietary supplements you use. Also tell them if you smoke, drink alcohol, or use illegal drugs. Some items may interact with your medicine. What should I watch for while using this medicine? Report any side effects that do not go away within 3 days to your doctor or health care professional. Call your health care provider if any unusual symptoms occur within 6 weeks of receiving this vaccine. You may still catch the flu, but the illness is not usually as bad. You cannot get the flu from the vaccine. The vaccine will not protect against colds or other illnesses that may cause fever. The vaccine is needed every year. What side effects may I notice from receiving this medicine? Side effects that you should report to your doctor or health care professional as soon as possible:  allergic reactions like skin rash, itching or hives, swelling of the face, lips, or tongue  Side effects that usually do not require medical attention (report to your doctor or health care professional if they continue or are bothersome):  fever  headache  muscle aches and pains  pain, tenderness, redness, or swelling at the injection site  tiredness This list may not describe all possible side effects. Call your doctor for medical advice about side effects. You may report side effects to FDA at 1-800-FDA-1088. Where should I keep my medicine? The vaccine will be given by a health care professional in a  clinic, pharmacy, doctor's office, or other health care setting. You will not be given vaccine doses to store at home. NOTE: This sheet is a summary. It may not cover all possible information. If you have questions about this medicine, talk to your doctor, pharmacist, or health care provider.  2020 Elsevier/Gold Standard (2017-11-12 08:45:43)

## 2018-10-01 ENCOUNTER — Encounter (HOSPITAL_COMMUNITY): Payer: Self-pay | Admitting: Hematology

## 2018-10-03 ENCOUNTER — Telehealth: Payer: Self-pay | Admitting: *Deleted

## 2018-10-03 ENCOUNTER — Other Ambulatory Visit: Payer: Self-pay | Admitting: *Deleted

## 2018-10-03 DIAGNOSIS — D472 Monoclonal gammopathy: Secondary | ICD-10-CM

## 2018-10-03 NOTE — Telephone Encounter (Signed)
Patient called - Patient would like results of bone marrow biopsy completed on 9/21, next appt with Dr. Irene Limbo not until 10/21 and she is worried.  Dr. Irene Limbo asked to have patient come next week to discuss results, on 10/6 with lab appt. Patient in agreement. Schedule message sent.

## 2018-10-07 ENCOUNTER — Inpatient Hospital Stay (HOSPITAL_BASED_OUTPATIENT_CLINIC_OR_DEPARTMENT_OTHER): Payer: Commercial Managed Care - PPO | Admitting: Hematology

## 2018-10-07 ENCOUNTER — Inpatient Hospital Stay: Payer: Commercial Managed Care - PPO

## 2018-10-07 ENCOUNTER — Other Ambulatory Visit: Payer: Self-pay

## 2018-10-07 ENCOUNTER — Inpatient Hospital Stay: Payer: Commercial Managed Care - PPO | Attending: Hematology

## 2018-10-07 VITALS — BP 140/97 | HR 74 | Temp 97.8°F | Resp 18 | Ht 67.0 in | Wt 159.8 lb

## 2018-10-07 VITALS — BP 131/85 | HR 63 | Temp 98.5°F | Resp 18

## 2018-10-07 DIAGNOSIS — Z9884 Bariatric surgery status: Secondary | ICD-10-CM

## 2018-10-07 DIAGNOSIS — K922 Gastrointestinal hemorrhage, unspecified: Secondary | ICD-10-CM

## 2018-10-07 DIAGNOSIS — D472 Monoclonal gammopathy: Secondary | ICD-10-CM

## 2018-10-07 DIAGNOSIS — D508 Other iron deficiency anemias: Secondary | ICD-10-CM | POA: Insufficient documentation

## 2018-10-07 DIAGNOSIS — D649 Anemia, unspecified: Secondary | ICD-10-CM

## 2018-10-07 LAB — CBC WITH DIFFERENTIAL (CANCER CENTER ONLY)
Abs Immature Granulocytes: 0.01 10*3/uL (ref 0.00–0.07)
Basophils Absolute: 0 10*3/uL (ref 0.0–0.1)
Basophils Relative: 0 %
Eosinophils Absolute: 0 10*3/uL (ref 0.0–0.5)
Eosinophils Relative: 1 %
HCT: 30.9 % — ABNORMAL LOW (ref 36.0–46.0)
Hemoglobin: 9.5 g/dL — ABNORMAL LOW (ref 12.0–15.0)
Immature Granulocytes: 0 %
Lymphocytes Relative: 24 %
Lymphs Abs: 1 10*3/uL (ref 0.7–4.0)
MCH: 32.9 pg (ref 26.0–34.0)
MCHC: 30.7 g/dL (ref 30.0–36.0)
MCV: 106.9 fL — ABNORMAL HIGH (ref 80.0–100.0)
Monocytes Absolute: 0.3 10*3/uL (ref 0.1–1.0)
Monocytes Relative: 8 %
Neutro Abs: 2.7 10*3/uL (ref 1.7–7.7)
Neutrophils Relative %: 67 %
Platelet Count: 181 10*3/uL (ref 150–400)
RBC: 2.89 MIL/uL — ABNORMAL LOW (ref 3.87–5.11)
RDW: 20 % — ABNORMAL HIGH (ref 11.5–15.5)
WBC Count: 4 10*3/uL (ref 4.0–10.5)
nRBC: 0 % (ref 0.0–0.2)

## 2018-10-07 LAB — CMP (CANCER CENTER ONLY)
ALT: 64 U/L — ABNORMAL HIGH (ref 0–44)
AST: 95 U/L — ABNORMAL HIGH (ref 15–41)
Albumin: 3.8 g/dL (ref 3.5–5.0)
Alkaline Phosphatase: 97 U/L (ref 38–126)
Anion gap: 7 (ref 5–15)
BUN: 12 mg/dL (ref 6–20)
CO2: 23 mmol/L (ref 22–32)
Calcium: 8.8 mg/dL — ABNORMAL LOW (ref 8.9–10.3)
Chloride: 109 mmol/L (ref 98–111)
Creatinine: 0.73 mg/dL (ref 0.44–1.00)
GFR, Est AFR Am: >60 mL/min (ref >60)
GFR, Estimated: >60 mL/min (ref >60)
Glucose, Bld: 95 mg/dL (ref 70–99)
Potassium: 3.4 mmol/L — ABNORMAL LOW (ref 3.5–5.1)
Sodium: 139 mmol/L (ref 135–145)
Total Bilirubin: 0.5 mg/dL (ref 0.3–1.2)
Total Protein: 7.8 g/dL (ref 6.5–8.1)

## 2018-10-07 MED ORDER — LORATADINE 10 MG PO TABS
10.0000 mg | ORAL_TABLET | Freq: Once | ORAL | Status: AC
  Administered 2018-10-07: 11:00:00 10 mg via ORAL

## 2018-10-07 MED ORDER — ACETAMINOPHEN 325 MG PO TABS
ORAL_TABLET | ORAL | Status: AC
  Filled 2018-10-07: qty 2

## 2018-10-07 MED ORDER — SODIUM CHLORIDE 0.9 % IV SOLN
Freq: Once | INTRAVENOUS | Status: AC
  Administered 2018-10-07: 11:00:00 via INTRAVENOUS
  Filled 2018-10-07: qty 250

## 2018-10-07 MED ORDER — LORATADINE 10 MG PO TABS
ORAL_TABLET | ORAL | Status: AC
  Filled 2018-10-07: qty 1

## 2018-10-07 MED ORDER — ACETAMINOPHEN 325 MG PO TABS
650.0000 mg | ORAL_TABLET | Freq: Once | ORAL | Status: AC
  Administered 2018-10-07: 11:00:00 650 mg via ORAL

## 2018-10-07 MED ORDER — SODIUM CHLORIDE 0.9 % IV SOLN
200.0000 mg | Freq: Once | INTRAVENOUS | Status: AC
  Administered 2018-10-07: 200 mg via INTRAVENOUS
  Filled 2018-10-07: qty 10

## 2018-10-07 NOTE — Patient Instructions (Signed)
Iron Sucrose injection What is this medicine? IRON SUCROSE (AHY ern SOO krohs) is an iron complex. Iron is used to make healthy red blood cells, which carry oxygen and nutrients throughout the body. This medicine is used to treat iron deficiency anemia in people with chronic kidney disease. This medicine may be used for other purposes; ask your health care provider or pharmacist if you have questions. COMMON BRAND NAME(S): Venofer What should I tell my health care provider before I take this medicine? They need to know if you have any of these conditions:  anemia not caused by low iron levels  heart disease  high levels of iron in the blood  kidney disease  liver disease  an unusual or allergic reaction to iron, other medicines, foods, dyes, or preservatives  pregnant or trying to get pregnant  breast-feeding How should I use this medicine? This medicine is for infusion into a vein. It is given by a health care professional in a hospital or clinic setting. Talk to your pediatrician regarding the use of this medicine in children. While this drug may be prescribed for children as young as 2 years for selected conditions, precautions do apply. Overdosage: If you think you have taken too much of this medicine contact a poison control center or emergency room at once. NOTE: This medicine is only for you. Do not share this medicine with others. What if I miss a dose? It is important not to miss your dose. Call your doctor or health care professional if you are unable to keep an appointment. What may interact with this medicine? Do not take this medicine with any of the following medications:  deferoxamine  dimercaprol  other iron products This medicine may also interact with the following medications:  chloramphenicol  deferasirox This list may not describe all possible interactions. Give your health care provider a list of all the medicines, herbs, non-prescription drugs, or  dietary supplements you use. Also tell them if you smoke, drink alcohol, or use illegal drugs. Some items may interact with your medicine. What should I watch for while using this medicine? Visit your doctor or healthcare professional regularly. Tell your doctor or healthcare professional if your symptoms do not start to get better or if they get worse. You may need blood work done while you are taking this medicine. You may need to follow a special diet. Talk to your doctor. Foods that contain iron include: whole grains/cereals, dried fruits, beans, or peas, leafy green vegetables, and organ meats (liver, kidney). What side effects may I notice from receiving this medicine? Side effects that you should report to your doctor or health care professional as soon as possible:  allergic reactions like skin rash, itching or hives, swelling of the face, lips, or tongue  breathing problems  changes in blood pressure  cough  fast, irregular heartbeat  feeling faint or lightheaded, falls  fever or chills  flushing, sweating, or hot feelings  joint or muscle aches/pains  seizures  swelling of the ankles or feet  unusually weak or tired Side effects that usually do not require medical attention (report to your doctor or health care professional if they continue or are bothersome):  diarrhea  feeling achy  headache  irritation at site where injected  nausea, vomiting  stomach upset  tiredness This list may not describe all possible side effects. Call your doctor for medical advice about side effects. You may report side effects to FDA at 1-800-FDA-1088. Where should I keep   my medicine? This drug is given in a hospital or clinic and will not be stored at home. NOTE: This sheet is a summary. It may not cover all possible information. If you have questions about this medicine, talk to your doctor, pharmacist, or health care provider.  2020 Elsevier/Gold Standard (2010-09-28  17:14:35) Coronavirus (COVID-19) Are you at risk?  Are you at risk for the Coronavirus (COVID-19)?  To be considered HIGH RISK for Coronavirus (COVID-19), you have to meet the following criteria:  . Traveled to [place], [place], [place], [place] or [place]; or in the [place] to [place], [place], [place], or [place]; and have fever, cough, and shortness of breath within the last 2 weeks of travel OR . Been in close contact with a person diagnosed with COVID-19 within the last 2 weeks and have fever, cough, and shortness of breath . IF YOU DO NOT MEET THESE CRITERIA, YOU ARE CONSIDERED LOW RISK FOR COVID-19.  What to do if you are HIGH RISK for COVID-19?  . If you are having a medical emergency, call 911. . Seek medical care right away. Before you go to a doctor's office, urgent care or emergency department, call ahead and tell them about your recent travel, contact with someone diagnosed with COVID-19, and your symptoms. You should receive instructions from your physician's office regarding next steps of care.  . When you arrive at healthcare provider, tell the healthcare staff immediately you have returned from visiting [place], [place], [place], [place] or [place]; or traveled in the [place] to [place], [place], [place], or [place]; in the last two weeks or you have been in close contact with a person diagnosed with COVID-19 in the last 2 weeks.   . Tell the health care staff about your symptoms: fever, cough and shortness of breath. . After you have been seen by a medical provider, you will be either: o Tested for (COVID-19) and discharged home on quarantine except to seek medical care if symptoms worsen, and asked to  - Stay home and avoid contact with others until you get your results (4-5 days)  - Avoid travel on public transportation if possible (such as bus, train, or airplane) or o Sent to the Emergency Department by EMS for evaluation, COVID-19 testing, and  possible admission depending on your condition and test results.  What to do if you are LOW RISK for COVID-19?  Reduce your risk of any infection by using the same precautions used for avoiding the common cold or flu:  . Wash your hands often with soap and warm water for at least 20 seconds.  If soap and water are not readily available, use an alcohol-based hand sanitizer with at least 60% alcohol.  . If coughing or sneezing, cover your mouth and nose by coughing or sneezing into the elbow areas of your shirt or coat, into a tissue or into your sleeve (not your hands). . Avoid shaking hands with others and consider head nods or verbal greetings only. . Avoid touching your eyes, nose, or mouth with unwashed hands.  . Avoid close contact with people who are sick. . Avoid places or events with large numbers of people in one location, like concerts or sporting events. . Carefully consider travel plans you have or are making. . If you are planning any travel outside or inside the [place], visit the CDC's Travelers' Health webpage for the latest health notices. . If you have some symptoms but not all   symptoms, continue to monitor at home and seek medical attention if your symptoms worsen. . If you are having a medical emergency, call 911.   ADDITIONAL HEALTHCARE OPTIONS FOR PATIENTS  [facility] / e-Visit: [email or web address]         [facility]: [phone]  [facility]: [phone]                   [facility]: [phone]  

## 2018-10-07 NOTE — Progress Notes (Signed)
HEMATOLOGY/ONCOLOGY CONSULTATION NOTE  Date of Service: 10/07/2018  Patient Care Team: Tyson Dense, MD as PCP - General (Obstetrics and Gynecology)  CHIEF COMPLAINTS/PURPOSE OF CONSULTATION:  Anemia  HISTORY OF PRESENTING ILLNESS:   Debra Jacobson is a wonderful 58 y.o. female who has been referred to [place] by Dr Lucillie Garfinkel for evaluation and management of anemia. The pt reports that she is doing well overall.  The pt reports that she was first given the diagnoses of anemia about 10 years ago. Pt is currently taking 1 iron pill every other day. Pt had a gastric bypass surgery in 2014. She initially weighed about 400 lbs and is currently 164 lbs. She has been experiencing a severe lack of appetite and weight loss. When she does attempt to eat she can only take a couple of bites and then has a bowel movement soon after that. She also has abdominal pain after she eats. Pt has seasonal allergies and eczema, for which she has been taking Dupixent for 3 months. Pt has never had a skin biopsy to confirm her eczema diagnosis. It was dormant for about 10 years and came back 2 years ago after an interaction with a cat and has persisted. Pt also takes Vistaril to help with the itch. Pt has never had an allergy test or immune therapy testing. Pt took steroid shots for her carpal tunnel. There were pre-cancerous cells found in pt's pap smear and her Gynecologist is planning on doing a resection. Pt was experiencing black stools a couple of months ago, that were not painful and did not speak with her PCP about it. She had a colonoscopy in 2014 and has never had an endoscopy. Pt believes her ankle swelling is from work and wears compression socks which helps.   Most recent lab results (08/12/2018) of CBC is as follows: WBC at 3.8K, RBC at 2.49, Hgb at 7.8, HCT at 24.3, MCV at 98, MCH at 31.3, MCHC at 32.1, RDW at 15.9, Platelets at 182K.  08/12/2018 Transferrin is 286 08/12/2018 Ferritin is 30    08/12/2018 Iron and TIBC shows Iron Bind, Cap, (TIBC) at 349, UIBC at 303, Iron at 46, Iron Sat at 13.   On review of systems, pt reports pain in her left knee, weight loss, fatigue, enlarged cervical lymph nodes, black/bloody stools, abdominal pain and denies vaginal bleeds, nose bleeds gum bleeds, chest pain, SOB, fevers, chills and any other symptoms.   On PMHx the pt reports gastric bypass surgery, two cesarean sections, tubal ligation.  On Social Hx the pt reports that she has quit smoking, no alcohol use.   INTERVAL HISTORY:   Debra Jacobson is a wonderful 58 y.o. female who is here today for evaluation and management of anemia. The patient's last visit with [place] was on 09/10/2018. The pt reports that she is doing well overall.  The pt reports that she had no issues with Venofer and has had 4 treatments so far. Pt has noticed increased energy since her infusions. Her current fatigue is due to a heavy work schedule. She has recently celebrated a birthday and taken much needed time off to rest.  She did notice occult blood in her stools previously but thought that it went away. She denies the bleeding coming from an external hemorrhoid source. Pt has never seen a GI but she did have a colonoscopy about 9 years ago. Pt denies pain in her back or hips or any new bone pain. She notes  issues with emesis since her bariatric surgery.   09/22/2018 Cytogenetics shows a "normal female karyotype in all cells analyzed".  09/22/2018 FISH Analysis shows "abnormal results - t(14,16) DETECTED" 09/22/2018 Bone Marrow report shows "Dyserythropoiesis. 15% Plasma cells"  Lab results today (10/07/18) of CBC w/diff and CMP is as follows: all values are WNL except for RBC at 2.89, Hgb at 9.5, HCT at 30.9, MCV at 106.9, RDW at 20.0, Potassium at 3.4, Calcium at 8.8, AST at 95, ALT at 64. 09/17/2018 24-Hr Ur UPEP/UIFE/Light Chains/TP is as follows: all values are WNL.   09/17/2018 Occult blood card to lab is  "POSITIVE" x2.   On review of systems, pt reports mild fatigue and denies back pain, hip pain, new bone pain, dizziness, lightheadedness, abdominal pain, leg swelling and any other symptoms.   MEDICAL HISTORY:  Past Medical History:  Diagnosis Date   Allergy    all rhinitis   Dependent edema    Dermatitis    GERD (gastroesophageal reflux disease)    Hypertension     SURGICAL HISTORY: Past Surgical History:  Procedure Laterality Date   CESAREAN SECTION     x2   TUBAL LIGATION      SOCIAL HISTORY: Social History   Socioeconomic History   Marital status: Married    Spouse name: Not on file   Number of children: Not on file   Years of education: Not on file   Highest education level: Not on file  Occupational History   Not on file  Social Needs   Financial resource strain: Not on file   Food insecurity    Worry: Not on file    Inability: Not on file   Transportation needs    Medical: Not on file    Non-medical: Not on file  Tobacco Use   Smoking status: Former Smoker    Types: Cigarettes    Quit date: 08/29/1991    Years since quitting: 27.1   Smokeless tobacco: Never Used  Substance and Sexual Activity   Alcohol use: No   Drug use: No   Sexual activity: Not on file  Lifestyle   Physical activity    Days per week: Not on file    Minutes per session: Not on file   Stress: Not on file  Relationships   Social connections    Talks on phone: Not on file    Gets together: Not on file    Attends religious service: Not on file    Active member of club or organization: Not on file    Attends meetings of clubs or organizations: Not on file    Relationship status: Not on file   Intimate partner violence    Fear of current or ex partner: Not on file    Emotionally abused: Not on file    Physically abused: Not on file    Forced sexual activity: Not on file  Other Topics Concern   Not on file  Social History Narrative   Not on file     FAMILY HISTORY: Family History  Problem Relation Age of Onset   Hyperlipidemia Mother     ALLERGIES:  has No Known Allergies.  MEDICATIONS:  Current Outpatient Medications  Medication Sig Dispense Refill   clobetasol cream (TEMOVATE) 1.44 % Apply 1 application topically 2 (two) times daily.     cyanocobalamin (,VITAMIN B-12,) 1000 MCG/ML injection Inject 1 mL (1,000 mcg total) into the skin every 30 (thirty) days. Start with 1000 mcg Sauk weekly x  4 doses then switch to every 30 days 10 mL 0   dupilumab (DUPIXENT) 200 MG/1.14ML prefilled syringe Inject 300 mg into the skin every 14 (fourteen) days.     ferrous sulfate 325 (65 FE) MG EC tablet Take 325 mg by mouth 2 (two) times daily.     hydrOXYzine (VISTARIL) 50 MG capsule Take 50 mg by mouth every evening.     ibuprofen (ADVIL,MOTRIN) 800 MG tablet Take 1 tablet (800 mg total) by mouth 3 (three) times daily. With food 21 tablet 0   methocarbamol (ROBAXIN) 500 MG tablet Take 1 tablet (500 mg total) by mouth 2 (two) times daily. 20 tablet 0   oxyCODONE (ROXICODONE) 5 MG immediate release tablet Take 1 tablet (5 mg total) by mouth every 6 (six) hours as needed for severe pain. 15 tablet 0   triamcinolone cream (KENALOG) 0.1 % Apply 1 application topically 4 (four) times daily as needed. (Patient not taking: Reported on 03/08/2017) 30 g 0   No current facility-administered medications for this visit.     REVIEW OF SYSTEMS:    A 10+ POINT REVIEW OF SYSTEMS WAS OBTAINED including neurology, dermatology, psychiatry, cardiac, respiratory, lymph, extremities, GI, GU, Musculoskeletal, constitutional, breasts, reproductive, HEENT.  All pertinent positives are noted in the HPI.  All others are negative.   PHYSICAL EXAMINATION: ECOG PERFORMANCE STATUS: 1 - Symptomatic but completely ambulatory  . Vitals:   10/07/18 1517  BP: (!) 140/97  Pulse: 74  Resp: 18  Temp: 97.8 F (36.6 C)  SpO2: 100%   Filed Weights   10/07/18 1517   Weight: 159 lb 12.8 oz (72.5 kg)   .Body mass index is 25.03 kg/m.  Exam was given in a chair   GENERAL:alert, in no acute distress and comfortable SKIN: no acute rashes, no significant lesions EYES: conjunctiva are pink and non-injected, sclera anicteric OROPHARYNX: MMM, no exudates, no oropharyngeal erythema or ulceration NECK: supple, no JVD LYMPH:  no palpable lymphadenopathy in the cervical, axillary or inguinal regions LUNGS: clear to auscultation b/l with normal respiratory effort HEART: regular rate & rhythm ABDOMEN:  normoactive bowel sounds , non tender, not distended. No palpable hepatosplenomegaly.  Extremity: no pedal edema PSYCH: alert & oriented x 3 with fluent speech NEURO: no focal motor/sensory deficits  LABORATORY DATA:  I have reviewed the data as listed  . CBC Latest Ref Rng & Units 10/07/2018 09/22/2018 09/01/2018  WBC 4.0 - 10.5 K/uL 4.0 3.6(L) 3.5(L)  Hemoglobin 12.0 - 15.0 g/dL 9.5(L) 7.7(L) 7.9(L)  Hematocrit 36.0 - 46.0 % 30.9(L) 25.4(L) 25.2(L)  Platelets 150 - 400 K/uL 181 223 202   . CBC    Component Value Date/Time   WBC 4.0 10/07/2018 1358   WBC 3.6 (L) 09/22/2018 1006   RBC 2.89 (L) 10/07/2018 1358   HGB 9.5 (L) 10/07/2018 1358   HCT 30.9 (L) 10/07/2018 1358   HCT 25.2 (L) 09/01/2018 1224   PLT 181 10/07/2018 1358   MCV 106.9 (H) 10/07/2018 1358   MCH 32.9 10/07/2018 1358   MCHC 30.7 10/07/2018 1358   RDW 20.0 (H) 10/07/2018 1358   LYMPHSABS 1.0 10/07/2018 1358   MONOABS 0.3 10/07/2018 1358   EOSABS 0.0 10/07/2018 1358   BASOSABS 0.0 10/07/2018 1358     . CMP Latest Ref Rng & Units 10/07/2018 09/01/2018 03/09/2017  Glucose 70 - 99 mg/dL 95 72 123(H)  BUN 6 - 20 mg/dL '12 11 12  '$ Creatinine 0.44 - 1.00 mg/dL 0.73 0.78 0.96  Sodium 135 -  145 mmol/L 139 139 136  Potassium 3.5 - 5.1 mmol/L 3.4(L) 3.9 4.2  Chloride 98 - 111 mmol/L 109 109 109  CO2 22 - 32 mmol/L '23 23 22  '$ Calcium 8.9 - 10.3 mg/dL 8.8(L) 8.4(L) 8.2(L)  Total Protein 6.5  - 8.1 g/dL 7.8 8.0 -  Total Bilirubin 0.3 - 1.2 mg/dL 0.5 0.3 -  Alkaline Phos 38 - 126 U/L 97 101 -  AST 15 - 41 U/L 95(H) 60(H) -  ALT 0 - 44 U/L 64(H) 37 -   . Lab Results  Component Value Date   IRON 37 (L) 09/01/2018   TIBC 358 09/01/2018   IRONPCTSAT 10 (L) 09/01/2018   (Iron and TIBC)  Lab Results  Component Value Date   FERRITIN 17 09/01/2018   09/22/2018 Bone Marrow Report   09/22/2018 Bone Marrow report    09/22/2018 FISH Analysis    09/22/2018 Cytogenetics   RADIOGRAPHIC STUDIES: I have personally reviewed the radiological images as listed and agreed with the findings in the report. Ct Biopsy  Result Date: 09/22/2018 INDICATION: 58 year old female with monoclonal paraproteinemia concerning for underlying multiple sclerosis. She presents for CT-guided bone marrow biopsy. EXAM: CT GUIDED BONE MARROW ASPIRATION AND CORE BIOPSY Interventional Radiologist:  Criselda Peaches, MD MEDICATIONS: None. ANESTHESIA/SEDATION: Moderate (conscious) sedation was employed during this procedure. A total of 2 milligrams versed and 100 micrograms fentanyl were administered intravenously. The patient's level of consciousness and vital signs were monitored continuously by radiology nursing throughout the procedure under my direct supervision. Total monitored sedation time: 11 minutes FLUOROSCOPY TIME:  None COMPLICATIONS: None immediate. Estimated blood loss: <25 mL PROCEDURE: Informed written consent was obtained from the patient after a thorough discussion of the procedural risks, benefits and alternatives. All questions were addressed. Maximal Sterile Barrier Technique was utilized including caps, mask, sterile gowns, sterile gloves, sterile drape, hand hygiene and skin antiseptic. A timeout was performed prior to the initiation of the procedure. The patient was positioned prone and non-contrast localization CT was performed of the pelvis to demonstrate the iliac marrow spaces. Maximal  barrier sterile technique utilized including caps, mask, sterile gowns, sterile gloves, large sterile drape, hand hygiene, and betadine prep. Under sterile conditions and local anesthesia, an 11 gauge coaxial bone biopsy needle was advanced into the right iliac marrow space. Needle position was confirmed with CT imaging. Initially, bone marrow aspiration was performed. Next, the 11 gauge outer cannula was utilized to obtain a right iliac bone marrow core biopsy. Needle was removed. Hemostasis was obtained with compression. The patient tolerated the procedure well. Samples were prepared with the cytotechnologist. IMPRESSION: Technically successful CT-guided bone marrow biopsy and aspiration. Electronically Signed   By: Jacqulynn Cadet M.D.   On: 09/22/2018 13:13   Ct Bone Marrow Biopsy & Aspiration  Result Date: 09/22/2018 INDICATION: 58 year old female with monoclonal paraproteinemia concerning for underlying multiple sclerosis. She presents for CT-guided bone marrow biopsy. EXAM: CT GUIDED BONE MARROW ASPIRATION AND CORE BIOPSY Interventional Radiologist:  Criselda Peaches, MD MEDICATIONS: None. ANESTHESIA/SEDATION: Moderate (conscious) sedation was employed during this procedure. A total of 2 milligrams versed and 100 micrograms fentanyl were administered intravenously. The patient's level of consciousness and vital signs were monitored continuously by radiology nursing throughout the procedure under my direct supervision. Total monitored sedation time: 11 minutes FLUOROSCOPY TIME:  None COMPLICATIONS: None immediate. Estimated blood loss: <25 mL PROCEDURE: Informed written consent was obtained from the patient after a thorough discussion of the procedural risks, benefits and alternatives. All  questions were addressed. Maximal Sterile Barrier Technique was utilized including caps, mask, sterile gowns, sterile gloves, sterile drape, hand hygiene and skin antiseptic. A timeout was performed prior to the  initiation of the procedure. The patient was positioned prone and non-contrast localization CT was performed of the pelvis to demonstrate the iliac marrow spaces. Maximal barrier sterile technique utilized including caps, mask, sterile gowns, sterile gloves, large sterile drape, hand hygiene, and betadine prep. Under sterile conditions and local anesthesia, an 11 gauge coaxial bone biopsy needle was advanced into the right iliac marrow space. Needle position was confirmed with CT imaging. Initially, bone marrow aspiration was performed. Next, the 11 gauge outer cannula was utilized to obtain a right iliac bone marrow core biopsy. Needle was removed. Hemostasis was obtained with compression. The patient tolerated the procedure well. Samples were prepared with the cytotechnologist. IMPRESSION: Technically successful CT-guided bone marrow biopsy and aspiration. Electronically Signed   By: Jacqulynn Cadet M.D.   On: 09/22/2018 13:13    ASSESSMENT & PLAN:   1) Cannot r/o RBC Macrocytosis, likely due to Vitamin B12 deficiency caused by Gastric bypass surgery - must r/o bone marrow infiltrative process due to MM  2) Iron deficiency- GI blood loss vs poor absorption related to Gastric bypass surgery. 09/17/2018 Occult blood card to lab is "POSITIVE" x2.  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 -Discussed 09/17/2018 24-Hr Ur UPEP/UIFE/Light Chains/TP is as follows: all values are WNL.   -Discussed 09/17/2018 Occult blood card to lab  is "POSITIVE" x2.  -Discussed 09/22/2018 Cytogenetics shows a "normal female karyotype in all cells analyzed".  -Discussed09/21/2020 FISH Analysis shows "abnormal results - t(14,16) DETECTED" -Discussed 09/22/2018 Bone Marrow report shows "Dyserythropoiesis. 15% Plasma cells" -09/01/2018 M Protein at 1.5, "IgG monoclonal protein with kappa light chain specificity" -09/01/2018 Kappa free light chain at 93.4 -Pt has monoclonal protein spike - concern for a clonal process  -Need to determine if active myeloma or smoldering myeloma -Pt does not meet the Crab criteria at this time: no hypercalcemia, no renal failure, improving anemia, no information on bone metasteses -Genetics do not suggest mutations for MDS  -Advised that [phone]) is common in myeloma and typically has an aggressive nature  -Discussed that denial of blood transfusions could affect her myeloma treatments  -Will discuss treatment further in light of previous Jehovah's Witness status - she is okay with erythropoietin products -Advised pt hold PO Iron in light of potential GI bleed  -Advised pt to take B-complex vitamin -Continue Vitamin B12 injections  -Will continue to track blood counts  -Will recommend PCP consider GI referral for GI bleeding. -PET/CT Whole Body scheduled for 10/10/2018 -Will see back in 1 month with labs   FOLLOW UP: PET/CT on 10/10/2018 GI referral RTC with Dr Irene Limbo with labs in 1 month  The total time spent in the appt was 40 minutes and more than 50% was on counseling and direct patient cares.  All of the patient's questions were answered with apparent satisfaction. The patient knows to call the  clinic with any problems, questions or concerns.   Sullivan Lone MD [place] AAHIVMS [facility] Hematology/Oncology Physician [facility]  (Office):       [phone] (Work cell):  [phone] (Fax):           [phone]  10/07/2018 9:01 AM  I, Yevette Edwards, am acting as a scribe for Dr.  Sullivan Lone.   .I have reviewed the above documentation for accuracy and completeness, and I agree with the above. Brunetta Genera MD

## 2018-10-08 ENCOUNTER — Telehealth: Payer: Self-pay | Admitting: Hematology

## 2018-10-08 NOTE — Telephone Encounter (Signed)
No los per 10/6. °

## 2018-10-10 ENCOUNTER — Encounter (HOSPITAL_COMMUNITY): Admission: RE | Admit: 2018-10-10 | Payer: Commercial Managed Care - PPO | Source: Ambulatory Visit

## 2018-10-13 ENCOUNTER — Other Ambulatory Visit: Payer: Self-pay | Admitting: Hematology

## 2018-10-13 DIAGNOSIS — D508 Other iron deficiency anemias: Secondary | ICD-10-CM

## 2018-10-13 DIAGNOSIS — D649 Anemia, unspecified: Secondary | ICD-10-CM

## 2018-10-13 DIAGNOSIS — D472 Monoclonal gammopathy: Secondary | ICD-10-CM

## 2018-10-13 DIAGNOSIS — K922 Gastrointestinal hemorrhage, unspecified: Secondary | ICD-10-CM

## 2018-10-14 ENCOUNTER — Encounter: Payer: Self-pay | Admitting: Nurse Practitioner

## 2018-10-15 ENCOUNTER — Other Ambulatory Visit: Payer: Self-pay

## 2018-10-15 ENCOUNTER — Ambulatory Visit (HOSPITAL_COMMUNITY): Admission: RE | Admit: 2018-10-15 | Payer: Commercial Managed Care - PPO | Source: Ambulatory Visit

## 2018-10-15 ENCOUNTER — Ambulatory Visit (HOSPITAL_COMMUNITY)
Admission: RE | Admit: 2018-10-15 | Discharge: 2018-10-15 | Disposition: A | Discharge status: Home / Self Care | Payer: Commercial Managed Care - PPO | Source: Ambulatory Visit | Attending: Hematology | Admitting: Hematology

## 2018-10-15 DIAGNOSIS — D472 Monoclonal gammopathy: Secondary | ICD-10-CM | POA: Diagnosis not present

## 2018-10-15 LAB — GLUCOSE, CAPILLARY: Glucose-Capillary: 90 mg/dL (ref 70–99)

## 2018-10-15 MED ORDER — FLUDEOXYGLUCOSE F - 18 (FDG) INJECTION
7.9000 | Freq: Once | INTRAVENOUS | Status: AC | PRN
  Administered 2018-10-15: 07:00:00 7.9 via INTRAVENOUS

## 2018-10-16 ENCOUNTER — Telehealth: Payer: Self-pay | Admitting: Hematology

## 2018-10-16 NOTE — Telephone Encounter (Signed)
R/s appt per 10/12 sch message pt is aware of appt date and time

## 2018-10-22 ENCOUNTER — Other Ambulatory Visit: Payer: Commercial Managed Care - PPO

## 2018-10-22 ENCOUNTER — Ambulatory Visit: Payer: Commercial Managed Care - PPO | Admitting: Hematology

## 2018-10-27 ENCOUNTER — Ambulatory Visit: Payer: Commercial Managed Care - PPO | Admitting: Nurse Practitioner

## 2018-11-13 ENCOUNTER — Telehealth: Payer: Self-pay | Admitting: Hematology

## 2018-11-13 ENCOUNTER — Inpatient Hospital Stay: Payer: Commercial Managed Care - PPO | Attending: Hematology

## 2018-11-13 ENCOUNTER — Inpatient Hospital Stay: Payer: Commercial Managed Care - PPO | Admitting: Hematology

## 2018-11-13 NOTE — Telephone Encounter (Signed)
Called pt per 11/12 sch message to reschedule missed appt . Unable to reach pt  - left message for patient to call back and reschedule.

## 2019-04-13 ENCOUNTER — Telehealth: Payer: Self-pay | Admitting: *Deleted

## 2019-04-13 NOTE — Telephone Encounter (Signed)
Patient contacted office - Patient called. Feels poorly, very tired, even after sleep. "Walking into walls". Wants to get blood checked. Dr. Irene Limbo informed. Note - She was seen in October 2020 and was to f/u in 4 weeks. Did not come to appointment or r/s. Per Dr. Irene Limbo - contacted patient and informed that schedule message sent for lab appointment and first available f/u to see Dr. Irene Limbo. Patient in agreement.

## 2019-04-14 ENCOUNTER — Telehealth: Payer: Self-pay | Admitting: Hematology

## 2019-04-14 NOTE — Telephone Encounter (Signed)
SCHEDULED APPT PER 4/12 [facility] MESSAGE - PT AWARE OF APPT

## 2019-04-15 ENCOUNTER — Inpatient Hospital Stay: Payer: Commercial Managed Care - PPO

## 2019-04-15 ENCOUNTER — Inpatient Hospital Stay: Payer: Commercial Managed Care - PPO | Attending: Hematology | Admitting: Hematology

## 2019-04-15 ENCOUNTER — Other Ambulatory Visit: Payer: Self-pay

## 2019-04-15 VITALS — BP 106/84 | HR 66 | Temp 98.5°F | Resp 18 | Ht 67.0 in | Wt 150.5 lb

## 2019-04-15 DIAGNOSIS — D649 Anemia, unspecified: Secondary | ICD-10-CM | POA: Insufficient documentation

## 2019-04-15 DIAGNOSIS — E538 Deficiency of other specified B group vitamins: Secondary | ICD-10-CM | POA: Insufficient documentation

## 2019-04-15 DIAGNOSIS — E559 Vitamin D deficiency, unspecified: Secondary | ICD-10-CM

## 2019-04-15 DIAGNOSIS — Z9884 Bariatric surgery status: Secondary | ICD-10-CM | POA: Diagnosis not present

## 2019-04-15 DIAGNOSIS — I10 Essential (primary) hypertension: Secondary | ICD-10-CM | POA: Diagnosis not present

## 2019-04-15 DIAGNOSIS — D508 Other iron deficiency anemias: Secondary | ICD-10-CM

## 2019-04-15 DIAGNOSIS — D472 Monoclonal gammopathy: Secondary | ICD-10-CM

## 2019-04-15 DIAGNOSIS — C9000 Multiple myeloma not having achieved remission: Secondary | ICD-10-CM

## 2019-04-15 DIAGNOSIS — Z79899 Other long term (current) drug therapy: Secondary | ICD-10-CM | POA: Insufficient documentation

## 2019-04-15 DIAGNOSIS — R945 Abnormal results of liver function studies: Secondary | ICD-10-CM | POA: Insufficient documentation

## 2019-04-15 DIAGNOSIS — Z87891 Personal history of nicotine dependence: Secondary | ICD-10-CM | POA: Insufficient documentation

## 2019-04-15 LAB — CMP (CANCER CENTER ONLY)
ALT: 65 U/L — ABNORMAL HIGH (ref 0–44)
AST: 95 U/L — ABNORMAL HIGH (ref 15–41)
Albumin: 3.6 g/dL (ref 3.5–5.0)
Alkaline Phosphatase: 118 U/L (ref 38–126)
Anion gap: 8 (ref 5–15)
BUN: 14 mg/dL (ref 6–20)
CO2: 21 mmol/L — ABNORMAL LOW (ref 22–32)
Calcium: 8.5 mg/dL — ABNORMAL LOW (ref 8.9–10.3)
Chloride: 111 mmol/L (ref 98–111)
Creatinine: 0.92 mg/dL (ref 0.44–1.00)
GFR, Est AFR Am: >60 mL/min (ref >60)
GFR, Estimated: >60 mL/min (ref >60)
Glucose, Bld: 77 mg/dL (ref 70–99)
Potassium: 4.1 mmol/L (ref 3.5–5.1)
Sodium: 140 mmol/L (ref 135–145)
Total Bilirubin: 0.4 mg/dL (ref 0.3–1.2)
Total Protein: 7.9 g/dL (ref 6.5–8.1)

## 2019-04-15 LAB — CBC WITH DIFFERENTIAL/PLATELET
Abs Immature Granulocytes: 0.01 10*3/uL (ref 0.00–0.07)
Basophils Absolute: 0 10*3/uL (ref 0.0–0.1)
Basophils Relative: 1 %
Eosinophils Absolute: 0.1 10*3/uL (ref 0.0–0.5)
Eosinophils Relative: 1 %
HCT: 36.4 % (ref 36.0–46.0)
Hemoglobin: 11.8 g/dL — ABNORMAL LOW (ref 12.0–15.0)
Immature Granulocytes: 0 %
Lymphocytes Relative: 37 %
Lymphs Abs: 1.7 10*3/uL (ref 0.7–4.0)
MCH: 34.7 pg — ABNORMAL HIGH (ref 26.0–34.0)
MCHC: 32.4 g/dL (ref 30.0–36.0)
MCV: 107.1 fL — ABNORMAL HIGH (ref 80.0–100.0)
Monocytes Absolute: 0.3 10*3/uL (ref 0.1–1.0)
Monocytes Relative: 7 %
Neutro Abs: 2.4 10*3/uL (ref 1.7–7.7)
Neutrophils Relative %: 54 %
Platelets: 210 10*3/uL (ref 150–400)
RBC: 3.4 MIL/uL — ABNORMAL LOW (ref 3.87–5.11)
RDW: 13.2 % (ref 11.5–15.5)
WBC: 4.5 10*3/uL (ref 4.0–10.5)
nRBC: 0 % (ref 0.0–0.2)

## 2019-04-15 LAB — IRON AND TIBC
Iron: 106 ug/dL (ref 41–142)
Saturation Ratios: 33 % (ref 21–57)
TIBC: 324 ug/dL (ref 236–444)
UIBC: 218 ug/dL (ref 120–384)

## 2019-04-15 LAB — FERRITIN: Ferritin: 72 ng/mL (ref 11–307)

## 2019-04-15 MED ORDER — ERGOCALCIFEROL 1.25 MG (50000 UT) PO CAPS: 50000.0000 [IU] | ORAL_CAPSULE | ORAL | 4 refills | Status: DC

## 2019-04-15 NOTE — Progress Notes (Signed)
HEMATOLOGY/ONCOLOGY CLINIC NOTE  Date of Service: 04/15/2019  Patient Care Team: Tyson Dense, MD as PCP - General (Obstetrics and Gynecology)  CHIEF COMPLAINTS/PURPOSE OF CONSULTATION:  Anemia  HISTORY OF PRESENTING ILLNESS:   Debra Jacobson is a wonderful 59 y.o. female who has been referred to [place] by Dr Lucillie Garfinkel for evaluation and management of anemia. The pt reports that she is doing well overall.  The pt reports that she was first given the diagnoses of anemia about 10 years ago. Pt is currently taking 1 iron pill every other day. Pt had a gastric bypass surgery in 2014. She initially weighed about 400 lbs and is currently 164 lbs. She has been experiencing a severe lack of appetite and weight loss. When she does attempt to eat she can only take a couple of bites and then has a bowel movement soon after that. She also has abdominal pain after she eats. Pt has seasonal allergies and eczema, for which she has been taking Dupixent for 3 months. Pt has never had a skin biopsy to confirm her eczema diagnosis. It was dormant for about 10 years and came back 2 years ago after an interaction with a cat and has persisted. Pt also takes Vistaril to help with the itch. Pt has never had an allergy test or immune therapy testing. Pt took steroid shots for her carpal tunnel. There were pre-cancerous cells found in pt's pap smear and her Gynecologist is planning on doing a resection. Pt was experiencing black stools a couple of months ago, that were not painful and did not speak with her PCP about it. She had a colonoscopy in 2014 and has never had an endoscopy. Pt believes her ankle swelling is from work and wears compression socks which helps.   Most recent lab results (08/12/2018) of CBC is as follows: WBC at 3.8K, RBC at 2.49, Hgb at 7.8, HCT at 24.3, MCV at 98, MCH at 31.3, MCHC at 32.1, RDW at 15.9, Platelets at 182K.  08/12/2018 Transferrin is 286 08/12/2018 Ferritin is 30   08/12/2018 Iron and TIBC shows Iron Bind, Cap, (TIBC) at 349, UIBC at 303, Iron at 46, Iron Sat at 13.   On review of systems, pt reports pain in her left knee, weight loss, fatigue, enlarged cervical lymph nodes, black/bloody stools, abdominal pain and denies vaginal bleeds, nose bleeds gum bleeds, chest pain, SOB, fevers, chills and any other symptoms.   On PMHx the pt reports gastric bypass surgery, two cesarean sections, tubal ligation.  On Social Hx the pt reports that she has quit smoking, no alcohol use.   INTERVAL HISTORY:   Debra Jacobson is a wonderful 59 y.o. female who is here today for evaluation and management of anemia. The patient's last visit with [place] was on 10/07/2018. The pt reports that she is doing well overall.  The pt reports that she is getting about 5-6 hours of sleep per night and is very fatigued during the day. She has been working 4-5 days per week, 12 hour shifts, and driving about an hour to work and back. Her fatigue started suddenly around the time that she increased her work hours. She has been having a significant amount of marital distress.   She has had some nausea, but denies any abdominal pain or bloody/black stools lately. Pt has not been to see a Copywriter, advertising in the interim. She was diagnosed with fatty liver in 2009. She stopped menstruating about 5 years ago. Pt  currently drinks 2-3 beers per weekend.  Pt has continued taking her Vitamin B12 injections every month but has not been taking her B-complex vitamin daily.   Of note since the patient's last visit, pt has had PET/CT ([id number]) completed on 10/14/020 with results revealing "1. No findings of active myeloma. 2. Late phase healing response of right-sided rib fractures. 3. Small to moderate left knee joint effusion with moderate size left Baker's cyst. Next Aortic Atherosclerosis (ICD10-I70.0). 4. Uterine fibroids. 5. Gastric bypass."  Lab results today (04/15/19) of CBC w/diff and CMP is  as follows: all values are WNL except for RBC at 3.40, Hgb at 11.8, MCV at 107.1, MCH at 34.7, CO2 at 21, Calcium at 8.5, AST at 95, ALT at 65. 04/15/2019 Iron and TIBC is as follows: Iron at 106, TIBC at 324, Sat Ratios at 33, UIBC at 218 04/15/2019 Ferritin at 72 04/15/2019 MMP is in progress  On review of systems, pt reports fatigue, stress, leg swelling, nausea and denies new bone pain, bloody/black stools, abdominal pain and any other symptoms.    MEDICAL HISTORY:  Past Medical History:  Diagnosis Date  . Allergy    all rhinitis  . Dependent edema   . Dermatitis   . GERD (gastroesophageal reflux disease)   . Hypertension     SURGICAL HISTORY: Past Surgical History:  Procedure Laterality Date  . CESAREAN SECTION     x2  . TUBAL LIGATION      SOCIAL HISTORY: Social History   Socioeconomic History  . Marital status: Married    Spouse name: Not on file  . Number of children: Not on file  . Years of education: Not on file  . Highest education level: Not on file  Occupational History  . Not on file  Tobacco Use  . Smoking status: Former Smoker    Types: Cigarettes    Quit date: 08/29/1991    Years since quitting: 27.6  . Smokeless tobacco: Never Used  Substance and Sexual Activity  . Alcohol use: No  . Drug use: No  . Sexual activity: Not on file  Other Topics Concern  . Not on file  Social History Narrative  . Not on file   Social Determinants of Health   Financial Resource Strain:   . Difficulty of Paying Living Expenses:   Food Insecurity:   . Worried About Charity fundraiser in the Last Year:   . Arboriculturist in the Last Year:   Transportation Needs:   . Film/video editor (Medical):   Marland Kitchen Lack of Transportation (Non-Medical):   Physical Activity:   . Days of Exercise per Week:   . Minutes of Exercise per Session:   Stress:   . Feeling of Stress :   Social Connections:   . Frequency of Communication with Friends and Family:   . Frequency  of Social Gatherings with Friends and Family:   . Attends Religious Services:   . Active Member of Clubs or Organizations:   . Attends Archivist Meetings:   Marland Kitchen Marital Status:   Intimate Partner Violence:   . Fear of Current or Ex-Partner:   . Emotionally Abused:   Marland Kitchen Physically Abused:   . Sexually Abused:     FAMILY HISTORY: Family History  Problem Relation Age of Onset  . Hyperlipidemia Mother     ALLERGIES:  is allergic to no known allergies.  MEDICATIONS:  Current Outpatient Medications  Medication Sig Dispense Refill  . clobetasol  cream (TEMOVATE) 2.22 % Apply 1 application topically 2 (two) times daily.    . cyanocobalamin (,VITAMIN B-12,) 1000 MCG/ML injection Inject 1 mL (1,000 mcg total) into the skin every 30 (thirty) days. Start with 1000 mcg Daniel weekly x 4 doses then switch to every 30 days 10 mL 0  . dupilumab (DUPIXENT) 200 MG/1.14ML prefilled syringe Inject 300 mg into the skin every 14 (fourteen) days.    . hydrOXYzine (VISTARIL) 50 MG capsule Take 50 mg by mouth every evening.    . ergocalciferol (VITAMIN D2) 1.25 MG (50000 UT) capsule Take 1 capsule (50,000 Units total) by mouth once a week. 12 capsule 4  . ferrous sulfate 325 (65 FE) MG EC tablet Take 325 mg by mouth 2 (two) times daily.    Marland Kitchen ibuprofen (ADVIL,MOTRIN) 800 MG tablet Take 1 tablet (800 mg total) by mouth 3 (three) times daily. With food (Patient not taking: Reported on 04/15/2019) 21 tablet 0  . methocarbamol (ROBAXIN) 500 MG tablet Take 1 tablet (500 mg total) by mouth 2 (two) times daily. (Patient not taking: Reported on 04/15/2019) 20 tablet 0  . oxyCODONE (ROXICODONE) 5 MG immediate release tablet Take 1 tablet (5 mg total) by mouth every 6 (six) hours as needed for severe pain. (Patient not taking: Reported on 04/15/2019) 15 tablet 0  . triamcinolone cream (KENALOG) 0.1 % Apply 1 application topically 4 (four) times daily as needed. (Patient not taking: Reported on 03/08/2017) 30 g 0   No  current facility-administered medications for this visit.    REVIEW OF SYSTEMS:   A 10+ POINT REVIEW OF SYSTEMS WAS OBTAINED including neurology, dermatology, psychiatry, cardiac, respiratory, lymph, extremities, GI, GU, Musculoskeletal, constitutional, breasts, reproductive, HEENT.  All pertinent positives are noted in the HPI.  All others are negative.   PHYSICAL EXAMINATION: ECOG PERFORMANCE STATUS: 1 - Symptomatic but completely ambulatory  . Vitals:   04/15/19 1051  BP: 106/84  Pulse: 66  Resp: 18  Temp: 98.5 F (36.9 C)  SpO2: 100%   Filed Weights   04/15/19 1051  Weight: 150 lb 8 oz (68.3 kg)   .Body mass index is 23.57 kg/m.  GENERAL:alert, in no acute distress and comfortable SKIN: no acute rashes, no significant lesions EYES: conjunctiva are pink and non-injected, sclera anicteric OROPHARYNX: MMM, no exudates, no oropharyngeal erythema or ulceration NECK: supple, no JVD LYMPH:  no palpable lymphadenopathy in the cervical, axillary or inguinal regions LUNGS: clear to auscultation b/l with normal respiratory effort HEART: regular rate & rhythm ABDOMEN:  normoactive bowel sounds , non tender, not distended. No palpable hepatosplenomegaly.  Extremity: no pedal edema PSYCH: alert & oriented x 3 with fluent speech NEURO: no focal motor/sensory deficits  LABORATORY DATA:  I have reviewed the data as listed  . CBC Latest Ref Rng & Units 04/15/2019 10/07/2018 09/22/2018  WBC 4.0 - 10.5 K/uL 4.5 4.0 3.6(L)  Hemoglobin 12.0 - 15.0 g/dL 11.8(L) 9.5(L) 7.7(L)  Hematocrit 36.0 - 46.0 % 36.4 30.9(L) 25.4(L)  Platelets 150 - 400 K/uL 210 181 223   . CBC    Component Value Date/Time   WBC 4.5 04/15/2019 1039   RBC 3.40 (L) 04/15/2019 1039   HGB 11.8 (L) 04/15/2019 1039   HGB 9.5 (L) 10/07/2018 1358   HCT 36.4 04/15/2019 1039   HCT 25.2 (L) 09/01/2018 1224   PLT 210 04/15/2019 1039   PLT 181 10/07/2018 1358   MCV 107.1 (H) 04/15/2019 1039   MCH 34.7 (H) 04/15/2019  1039  MCHC 32.4 04/15/2019 1039   RDW 13.2 04/15/2019 1039   LYMPHSABS 1.7 04/15/2019 1039   MONOABS 0.3 04/15/2019 1039   EOSABS 0.1 04/15/2019 1039   BASOSABS 0.0 04/15/2019 1039     . CMP Latest Ref Rng & Units 04/15/2019 10/07/2018 09/01/2018  Glucose 70 - 99 mg/dL 77 95 72  BUN 6 - 20 mg/dL _0 Creatinine 0.44 - 1.00 mg/dL 0.92 0.73 0.78  Sodium 135 - 145 mmol/L 140 139 139  Potassium 3.5 - 5.1 mmol/L 4.1 3.4(L) 3.9  Chloride 98 - 111 mmol/L 111 109 109  CO2 22 - 32 mmol/L 21(L) 23 23  Calcium 8.9 - 10.3 mg/dL 8.5(L) 8.8(L) 8.4(L)  Total Protein 6.5 - 8.1 g/dL 7.9 7.8 8.0  Total Bilirubin 0.3 - 1.2 mg/dL 0.4 0.5 0.3  Alkaline Phos 38 - 126 U/L 118 97 101  AST 15 - 41 U/L 95(H) 95(H) 60(H)  ALT 0 - 44 U/L 65(H) 64(H) 37   . Lab Results  Component Value Date   IRON 106 04/15/2019   TIBC 324 04/15/2019   IRONPCTSAT 33 04/15/2019   (Iron and TIBC)  Lab Results  Component Value Date   FERRITIN 72 04/15/2019   09/22/2018 Bone Marrow Report   09/22/2018 Bone Marrow report    09/22/2018 FISH Analysis    09/22/2018 Cytogenetics   RADIOGRAPHIC STUDIES: I have personally reviewed the radiological images as listed and agreed with the findings in the report. No results found.  ASSESSMENT & PLAN:   1) Cannot r/o RBC Macrocytosis, likely due to Vitamin B12 deficiency caused by Gastric bypass surgery - must r/o bone marrow infiltrative process due to MM  2) Iron deficiency- GI blood loss vs poor absorption related to Gastric bypass surgery. 09/17/2018 Occult blood card to lab is "POSITIVE" x2.  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 15% Plasma cells" 4)  Previous Jehovah's Witness - does not want a blood transfusion, even under life threatening conditions -Okay with erythropoietin products 5) Abnormal LFTs - ETOH + fatty liver PLAN: -Discussed pt labwork today, 04/15/19; improved anemia, continued macrocytosis, Calcium is low, some liver enzyme elevation -Discussed 04/15/2019 Iron and TIBC shows all values are WNL -Discussed 04/15/2019 Ferritin is WNL at 72 -Discussed 04/15/2019 MMP is in progress -Discussed 10/15/2018 PET/CT ([id number]) which revealed "1. No findings of active myeloma. 2. Late phase healing response of right-sided rib fractures. 3. Small to moderate left knee joint effusion with moderate size left Baker's cyst. Next Aortic Atherosclerosis (ICD10-I70.0). 4. Uterine fibroids. 5. 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  -Advised pt that translocation [phone]) is concerning for more aggressive behavior - will keep a close eye on labs -Advised pt that her anemia is not the cause of her fatigue. Likely due to stress, lack of sleep, and heavy work schedule.  -Advised pt that she may need ongoing IV Iron support due to malabsorption of iron from previous gastric bypass -Recommend pt get repeat hepatic imaging with PCP to evaluate Fatty Liver -Recommend pt take Vitamin D supplement for bone health  -Recommend pt begin B-complex vitamin daily  -Continue  monthly [place] Vitamin B12 injections  -Will see back in 3 months with labs   FOLLOW UP: RTC with Dr Irene Limbo with labs in 3 months   The total time spent in the appt was 30 minutes and more than 50% was on counseling and direct patient cares.  All of the patient's questions were answered with apparent satisfaction. The patient knows to call the clinic with any problems, questions or concerns.     Sullivan Lone MD [place] AAHIVMS [facility] Hematology/Oncology Physician [facility]  (Office):       [phone] (Work cell):  [phone] (Fax):           [phone]  04/15/2019 1:24 PM  I, Yevette Edwards, am acting as a scribe for Dr. Sullivan Lone.   .I have reviewed the above documentation for accuracy and completeness, and I agree with the above. Brunetta Genera MD

## 2019-04-17 ENCOUNTER — Telehealth: Payer: Self-pay | Admitting: Hematology

## 2019-04-17 NOTE — Telephone Encounter (Signed)
Scheduled per 04/14 los, patient has been called and notified. ?

## 2019-04-21 LAB — MULTIPLE MYELOMA PANEL, SERUM
Albumin SerPl Elph-Mcnc: 3.9 g/dL (ref 2.9–4.4)
Albumin/Glob SerPl: 1.1 (ref 0.7–1.7)
Alpha 1: 0.1 g/dL (ref 0.0–0.4)
Alpha2 Glob SerPl Elph-Mcnc: 0.7 g/dL (ref 0.4–1.0)
B-Globulin SerPl Elph-Mcnc: 0.7 g/dL (ref 0.7–1.3)
Gamma Glob SerPl Elph-Mcnc: 2 g/dL — ABNORMAL HIGH (ref 0.4–1.8)
Globulin, Total: 3.6 g/dL (ref 2.2–3.9)
IgA: 32 mg/dL — ABNORMAL LOW (ref 87–352)
IgG (Immunoglobin G), Serum: 1995 mg/dL — ABNORMAL HIGH (ref 586–1602)
IgM (Immunoglobulin M), Srm: 376 mg/dL — ABNORMAL HIGH (ref 26–217)
M Protein SerPl Elph-Mcnc: 1.3 g/dL — ABNORMAL HIGH
Total Protein ELP: 7.5 g/dL (ref 6.0–8.5)

## 2019-05-09 ENCOUNTER — Other Ambulatory Visit: Payer: Self-pay | Admitting: Hematology

## 2019-05-11 NOTE — Telephone Encounter (Signed)
Please review for refill.  

## 2019-05-20 ENCOUNTER — Observation Stay (HOSPITAL_COMMUNITY)
Admission: EM | Admit: 2019-05-20 | Discharge: 2019-05-21 | Disposition: A | Discharge status: Home / Self Care | Payer: Self-pay | Attending: Internal Medicine | Admitting: Internal Medicine

## 2019-05-20 ENCOUNTER — Other Ambulatory Visit: Payer: Self-pay

## 2019-05-20 ENCOUNTER — Encounter (HOSPITAL_COMMUNITY): Payer: Self-pay | Admitting: Emergency Medicine

## 2019-05-20 ENCOUNTER — Emergency Department (HOSPITAL_COMMUNITY): Payer: Self-pay

## 2019-05-20 DIAGNOSIS — I10 Essential (primary) hypertension: Secondary | ICD-10-CM | POA: Insufficient documentation

## 2019-05-20 DIAGNOSIS — Z20822 Contact with and (suspected) exposure to covid-19: Secondary | ICD-10-CM | POA: Insufficient documentation

## 2019-05-20 DIAGNOSIS — Z9884 Bariatric surgery status: Secondary | ICD-10-CM | POA: Insufficient documentation

## 2019-05-20 DIAGNOSIS — F1020 Alcohol dependence, uncomplicated: Secondary | ICD-10-CM | POA: Insufficient documentation

## 2019-05-20 DIAGNOSIS — Z87891 Personal history of nicotine dependence: Secondary | ICD-10-CM | POA: Insufficient documentation

## 2019-05-20 DIAGNOSIS — K852 Alcohol induced acute pancreatitis without necrosis or infection: Principal | ICD-10-CM | POA: Insufficient documentation

## 2019-05-20 DIAGNOSIS — K219 Gastro-esophageal reflux disease without esophagitis: Secondary | ICD-10-CM | POA: Insufficient documentation

## 2019-05-20 LAB — COMPREHENSIVE METABOLIC PANEL
ALT: 441 U/L — ABNORMAL HIGH (ref 0–44)
AST: 857 U/L — ABNORMAL HIGH (ref 15–41)
Albumin: 3.6 g/dL (ref 3.5–5.0)
Alkaline Phosphatase: 149 U/L — ABNORMAL HIGH (ref 38–126)
Anion gap: 10 (ref 5–15)
BUN: 16 mg/dL (ref 6–20)
CO2: 22 mmol/L (ref 22–32)
Calcium: 8.7 mg/dL — ABNORMAL LOW (ref 8.9–10.3)
Chloride: 103 mmol/L (ref 98–111)
Creatinine, Ser: 0.76 mg/dL (ref 0.44–1.00)
GFR calc Af Amer: >60 mL/min (ref >60)
GFR calc non Af Amer: >60 mL/min (ref >60)
Glucose, Bld: 103 mg/dL — ABNORMAL HIGH (ref 70–99)
Potassium: 3.8 mmol/L (ref 3.5–5.1)
Sodium: 135 mmol/L (ref 135–145)
Total Bilirubin: 2.5 mg/dL — ABNORMAL HIGH (ref 0.3–1.2)
Total Protein: 7.8 g/dL (ref 6.5–8.1)

## 2019-05-20 LAB — CBC
HCT: 36.5 % (ref 36.0–46.0)
Hemoglobin: 12.6 g/dL (ref 12.0–15.0)
MCH: 35.5 pg — ABNORMAL HIGH (ref 26.0–34.0)
MCHC: 34.5 g/dL (ref 30.0–36.0)
MCV: 102.8 fL — ABNORMAL HIGH (ref 80.0–100.0)
Platelets: 126 10*3/uL — ABNORMAL LOW (ref 150–400)
RBC: 3.55 MIL/uL — ABNORMAL LOW (ref 3.87–5.11)
RDW: 15 % (ref 11.5–15.5)
WBC: 6.6 10*3/uL (ref 4.0–10.5)
nRBC: 0 % (ref 0.0–0.2)

## 2019-05-20 LAB — LIPID PANEL
Cholesterol: 129 mg/dL (ref 0–200)
HDL: 37 mg/dL — ABNORMAL LOW (ref >40)
LDL Cholesterol: 65 mg/dL (ref 0–99)
Total CHOL/HDL Ratio: 3.5 RATIO
Triglycerides: 137 mg/dL (ref <150)
VLDL: 27 mg/dL (ref 0–40)

## 2019-05-20 LAB — SARS CORONAVIRUS 2 BY RT PCR (HOSPITAL ORDER, PERFORMED IN ~~LOC~~ HOSPITAL LAB): SARS Coronavirus 2: NEGATIVE

## 2019-05-20 LAB — HIV ANTIBODY (ROUTINE TESTING W REFLEX): HIV Screen 4th Generation wRfx: NONREACTIVE

## 2019-05-20 LAB — MAGNESIUM: Magnesium: 1.7 mg/dL (ref 1.7–2.4)

## 2019-05-20 LAB — LIPASE, BLOOD: Lipase: 198 U/L — ABNORMAL HIGH (ref 11–51)

## 2019-05-20 MED ORDER — ONDANSETRON 4 MG PO TBDP
4.0000 mg | ORAL_TABLET | Freq: Once | ORAL | Status: AC | PRN
  Administered 2019-05-20: 4 mg via ORAL
  Filled 2019-05-20: qty 1

## 2019-05-20 MED ORDER — SODIUM CHLORIDE 0.9 % IV BOLUS
1000.0000 mL | Freq: Once | INTRAVENOUS | Status: AC
  Administered 2019-05-20: 1000 mL via INTRAVENOUS

## 2019-05-20 MED ORDER — OXYCODONE HCL 5 MG PO TABS: 5.0000 mg | ORAL_TABLET | Freq: Once | ORAL | Status: DC

## 2019-05-20 MED ORDER — SODIUM CHLORIDE (PF) 0.9 % IJ SOLN
INTRAMUSCULAR | Status: AC
  Filled 2019-05-20: qty 50

## 2019-05-20 MED ORDER — ONDANSETRON HCL 4 MG/2ML IJ SOLN
4.0000 mg | Freq: Four times a day (QID) | INTRAMUSCULAR | Status: DC | PRN
  Administered 2019-05-20: 4 mg via INTRAVENOUS
  Filled 2019-05-20: qty 2

## 2019-05-20 MED ORDER — ONDANSETRON HCL 4 MG/2ML IJ SOLN
4.0000 mg | Freq: Once | INTRAMUSCULAR | Status: AC
  Administered 2019-05-20: 4 mg via INTRAVENOUS
  Filled 2019-05-20: qty 2

## 2019-05-20 MED ORDER — ACETAMINOPHEN 650 MG RE SUPP: 650.0000 mg | Freq: Four times a day (QID) | RECTAL | Status: DC | PRN

## 2019-05-20 MED ORDER — MORPHINE SULFATE (PF) 4 MG/ML IV SOLN
4.0000 mg | Freq: Once | INTRAVENOUS | Status: AC
  Administered 2019-05-20: 4 mg via INTRAVENOUS
  Filled 2019-05-20: qty 1

## 2019-05-20 MED ORDER — SODIUM CHLORIDE 0.9% FLUSH: 3.0000 mL | Freq: Once | INTRAVENOUS | Status: DC

## 2019-05-20 MED ORDER — SODIUM CHLORIDE 0.9 % IV SOLN: INTRAVENOUS | Status: DC

## 2019-05-20 MED ORDER — IOHEXOL 300 MG/ML  SOLN
100.0000 mL | Freq: Once | INTRAMUSCULAR | Status: AC | PRN
  Administered 2019-05-20: 100 mL via INTRAVENOUS

## 2019-05-20 MED ORDER — ACETAMINOPHEN 325 MG PO TABS: 650.0000 mg | ORAL_TABLET | Freq: Four times a day (QID) | ORAL | Status: DC | PRN

## 2019-05-20 MED ORDER — MORPHINE SULFATE (PF) 2 MG/ML IV SOLN
2.0000 mg | INTRAVENOUS | Status: DC | PRN
  Administered 2019-05-20 – 2019-05-21 (×2): 2 mg via INTRAVENOUS
  Filled 2019-05-20 (×2): qty 1

## 2019-05-20 MED ORDER — ONDANSETRON HCL 4 MG PO TABS: 4.0000 mg | ORAL_TABLET | Freq: Four times a day (QID) | ORAL | Status: DC | PRN

## 2019-05-20 MED ORDER — HYDROXYZINE HCL 25 MG PO TABS: 25.0000 mg | ORAL_TABLET | Freq: Every evening | ORAL | Status: DC | PRN

## 2019-05-20 MED ORDER — ENOXAPARIN SODIUM 40 MG/0.4ML ~~LOC~~ SOLN
40.0000 mg | SUBCUTANEOUS | Status: DC
  Administered 2019-05-20: 40 mg via SUBCUTANEOUS
  Filled 2019-05-20: qty 0.4

## 2019-05-20 NOTE — ED Notes (Signed)
Transport called to take pt upstairs 

## 2019-05-20 NOTE — ED Triage Notes (Signed)
Patient states that she has been drinking 2 40oz a few days a week for "a long while now." Patient says she began having epigastric pain x2 days ago. Patient believes it to be pancreatitis. Patient dry heaving in triage.

## 2019-05-20 NOTE — ED Provider Notes (Signed)
[facility] DEPT Provider Note   CSN: [id number] Arrival date & time: 05/20/19  0440     History Chief Complaint  Patient presents with  . Abdominal Pain    Debra Jacobson is a 59 y.o. female.  HPI      59yo female with history of hypertension presents with concern for abdominal pain.  Reports drinking not every day, but a few days a week and drank 2-3 40oz on 5/17 and has persistent epigastric pain since then.  Reports pain severe 15/10, aching in epigastrium with radiation to LUQ. No RUQ abdominal pain. Has had nausea and vomiting, unable to keep anything down at home.  Notes diarrhea as well, no black or bloody stools.  No fevers, no urinary symptoms.  No cough. Hx of gastric bypass without other surgeries.    Past Medical History:  Diagnosis Date  . Allergy    all rhinitis  . Dependent edema   . Dermatitis   . GERD (gastroesophageal reflux disease)   . Hypertension     Patient Active Problem List   Diagnosis Date Noted  . Iron deficiency anemia 09/10/2018  . Eczema 12/09/2011  . Status post gastric bypass for obesity 05/30/2011    Past Surgical History:  Procedure Laterality Date  . CESAREAN SECTION     x2  . TUBAL LIGATION       OB History   No obstetric history on file.     Family History  Problem Relation Age of Onset  . Hyperlipidemia Mother     Social History   Tobacco Use  . Smoking status: Former Smoker    Types: Cigarettes    Quit date: 08/29/1991    Years since quitting: 27.7  . Smokeless tobacco: Never Used  Substance Use Topics  . Alcohol use: Yes    Comment: Patient drinks 2 40ox beers multiple days a week  . Drug use: No    Home Medications Prior to Admission medications   Medication Sig Start Date End Date Taking? Authorizing Provider  cyanocobalamin (,VITAMIN B-12,) 1000 MCG/ML injection INJECT 1 ML UNDER THE SKIN WEEKLY FOR 4 WEEKS, THEN 1ML UNDER THE SKIN EVERY 30 DAYS Patient taking  differently: Inject 1,000 mcg into the skin every 30 (thirty) days.  05/11/19  Yes Brunetta Genera, MD  DUPIXENT 300 MG/2ML prefilled syringe Inject 300 mg into the skin every 14 (fourteen) days. 04/09/19  Yes [provider]  ergocalciferol (VITAMIN D2) 1.25 MG (50000 UT) capsule Take 1 capsule (50,000 Units total) by mouth once a week. 04/15/19  Yes Brunetta Genera, MD  hydrOXYzine (VISTARIL) 25 MG capsule Take 25-50 mg by mouth at bedtime as needed for sleep. 04/09/19  Yes [provider]  triamcinolone cream (KENALOG) 0.1 % Apply 1 application topically 4 (four) times daily as needed. Patient not taking: Reported on 03/08/2017 05/22/16   Clayton Bibles, PA-C    Allergies    Patient has no known allergies.  Review of Systems   Review of Systems  Constitutional: Negative for fever.  HENT: Negative for sore throat.   Eyes: Negative for visual disturbance.  Respiratory: Negative for cough and shortness of breath.   Cardiovascular: Negative for chest pain.  Gastrointestinal: Positive for abdominal pain, diarrhea, nausea and vomiting.  Genitourinary: Negative for difficulty urinating.  Musculoskeletal: Positive for back pain. Negative for neck pain.  Skin: Negative for rash.  Neurological: Negative for syncope and headaches.    Physical Exam Updated Vital Signs BP  126/86   Pulse 66   Temp 97.9 F (36.6 C) (Oral)   Resp 18   Ht 5' 7" (1.702 m)   Wt 68 kg   LMP 05/01/2012   SpO2 99%   BMI 23.49 kg/m   Physical Exam Vitals and nursing note reviewed.  Constitutional:      General: She is not in acute distress.    Appearance: She is well-developed. She is not diaphoretic.  HENT:     Head: Normocephalic and atraumatic.  Eyes:     Conjunctiva/sclera: Conjunctivae normal.  Cardiovascular:     Rate and Rhythm: Normal rate.  Pulmonary:     Effort: Pulmonary effort is normal. No respiratory distress.  Abdominal:     General: There is no distension.      Palpations: Abdomen is soft.     Tenderness: There is abdominal tenderness in the epigastric area and left upper quadrant. There is no guarding. Negative signs include Murphy's sign and McBurney's sign.  Musculoskeletal:        General: No tenderness.     Cervical back: Normal range of motion.  Skin:    General: Skin is warm and dry.     Findings: No erythema or rash.  Neurological:     Mental Status: She is alert and oriented to person, place, and time.     ED Results / Procedures / Treatments   Labs (all labs ordered are listed, but only abnormal results are displayed) Labs Reviewed  LIPASE, BLOOD - Abnormal; Notable for the following components:      Result Value   Lipase 198 (*)    All other components within normal limits  COMPREHENSIVE METABOLIC PANEL - Abnormal; Notable for the following components:   Glucose, Bld 103 (*)    Calcium 8.7 (*)    AST 857 (*)    ALT 441 (*)    Alkaline Phosphatase 149 (*)    Total Bilirubin 2.5 (*)    All other components within normal limits  CBC - Abnormal; Notable for the following components:   RBC 3.55 (*)    MCV 102.8 (*)    MCH 35.5 (*)    Platelets 126 (*)    All other components within normal limits  SARS CORONAVIRUS 2 BY RT PCR (HOSPITAL ORDER, [place] [facility])  URINALYSIS, ROUTINE W REFLEX MICROSCOPIC    EKG None  Radiology No results found.  Procedures Procedures (including critical care time)  Medications Ordered in ED Medications  sodium chloride flush (NS) 0.9 % injection 3 mL (has no administration in time range)  iohexol (OMNIPAQUE) 300 MG/ML solution 100 mL (has no administration in time range)  sodium chloride (PF) 0.9 % injection (has no administration in time range)  ondansetron (ZOFRAN-ODT) disintegrating tablet 4 mg (4 mg Oral Given 05/20/19 0530)  sodium chloride 0.9 % bolus 1,000 mL (1,000 mLs Intravenous New Bag/Given 05/20/19 0750)  morphine 4 MG/ML injection 4 mg (4 mg  Intravenous Given 05/20/19 0751)  sodium chloride 0.9 % bolus 1,000 mL (0 mLs Intravenous Stopped 05/20/19 1012)  morphine 4 MG/ML injection 4 mg (4 mg Intravenous Given 05/20/19 1009)  ondansetron (ZOFRAN) injection 4 mg (4 mg Intravenous Given 05/20/19 1009)    ED Course  I have reviewed the triage vital signs and the nursing notes.  Pertinent labs & imaging results that were available during my care of the patient were reviewed by me and considered in my medical decision making (see chart for details).  MDM Rules/Calculators/A&P                      59yo female with history of hypertension presents with concern for abdominal pain.  Pain began after significant etoh consumption, labs show lipase of 198, AST 857, ALT 441, alk phos 149, bili 2.5. Suspect etoh induced by history however will obtain imaging given transaminase abnormalities. Given morphine for pain, zofran for nausea.   CT abdomen pelvis shows pancreatitis without complication, no sign of biliary duct dilation.  Will admit for further care.      Final Clinical Impression(s) / ED Diagnoses Final diagnoses:  Alcohol-induced acute pancreatitis, unspecified complication status    Rx / DC Orders ED Discharge Orders    None       Gareth Morgan, MD 05/20/19 [id number]

## 2019-05-20 NOTE — ED Notes (Signed)
ED TO INPATIENT HANDOFF REPORT  Name/Age/Gender Debra Jacobson 59 y.o. female  Code Status    Code Status Orders  (From admission, onward)         Start     Ordered   05/20/19 1142  Full code  Continuous     05/20/19 1142        Code Status History    This patient has a current code status but no historical code status.   Advance Care Planning Activity      Home/SNF/Other Home  Chief Complaint Acute alcoholic pancreatitis [id number]  Level of Care/Admitting Diagnosis ED Disposition    ED Disposition Condition Comment   Admit  Hospital Area: [facility] [[id number]]  Level of Care: Med-Surg [16]  Covid Evaluation: Confirmed COVID Negative  Diagnosis: Acute alcoholic pancreatitis [id number]  Admitting Physician: Darliss Cheney [id number]  Attending Physician: Darliss Cheney [id number]       Medical History Past Medical History:  Diagnosis Date  . Allergy    all rhinitis  . Dependent edema   . Dermatitis   . GERD (gastroesophageal reflux disease)   . Hypertension     Allergies No Known Allergies  IV Location/Drains/Wounds Patient Lines/Drains/Airways Status   Active Line/Drains/Airways    Name:   Placement date:   Placement time:   Site:   Days:   Peripheral IV 05/20/19 Left;Distal;Posterior Forearm   05/20/19    0745    Forearm   less than 1          Labs/Imaging Results for orders placed or performed during the hospital encounter of 05/20/19 (from the past 48 hour(s))  Lipase, blood     Status: Abnormal   Collection Time: 05/20/19  6:30 AM  Result Value Ref Range   Lipase 198 (H) 11 - 51 U/L    Comment: Performed at [facility], [place] [address] [place], [place] [address]  Comprehensive metabolic panel     Status: Abnormal   Collection Time: 05/20/19  6:30 AM  Result Value Ref Range   Sodium 135 135 - 145 mmol/L   Potassium 3.8 3.5 - 5.1 mmol/L   Chloride 103 98 - 111 mmol/L   CO2 22 22 - 32 mmol/L   Glucose, Bld 103 (H) 70 - 99 mg/dL    Comment: Glucose reference range applies only to samples taken after fasting for at least 8 hours.   BUN 16 6 - 20 mg/dL   Creatinine, Ser 0.76 0.44 - 1.00 mg/dL   Calcium 8.7 (L) 8.9 - 10.3 mg/dL   Total Protein 7.8 6.5 - 8.1 g/dL   Albumin 3.6 3.5 - 5.0 g/dL   AST 857 (H) 15 - 41 U/L   ALT 441 (H) 0 - 44 U/L   Alkaline Phosphatase 149 (H) 38 - 126 U/L   Total Bilirubin 2.5 (H) 0.3 - 1.2 mg/dL   GFR calc non Af Amer >60 >60 mL/min   GFR calc Af Amer >60 >60 mL/min   Anion gap 10 5 - 15    Comment: Performed at [facility], [place] [address] [place], [place] [address]  CBC     Status: Abnormal   Collection Time: 05/20/19  6:30 AM  Result Value Ref Range   WBC 6.6 4.0 - 10.5 K/uL   RBC 3.55 (L) 3.87 - 5.11 MIL/uL   Hemoglobin 12.6 12.0 - 15.0 g/dL   HCT 36.5 36.0 - 46.0 %   MCV 102.8 (H) 80.0 -  100.0 fL   MCH 35.5 (H) 26.0 - 34.0 pg   MCHC 34.5 30.0 - 36.0 g/dL   RDW 15.0 11.5 - 15.5 %   Platelets 126 (L) 150 - 400 K/uL   nRBC 0.0 0.0 - 0.2 %    Comment: Performed at [facility], [place] [address], [place], [place] [address]  SARS Coronavirus 2 by RT PCR (hospital order, performed in [facility] hospital lab) Nasopharyngeal Nasopharyngeal Swab     Status: None   Collection Time: 05/20/19  7:45 AM   Specimen: Nasopharyngeal Swab  Result Value Ref Range   SARS Coronavirus 2 NEGATIVE NEGATIVE    Comment: (NOTE) SARS-CoV-2 target nucleic acids are NOT DETECTED. The SARS-CoV-2 RNA is generally detectable in upper and lower respiratory specimens during the acute phase of infection. The lowest concentration of SARS-CoV-2 viral copies this assay can detect is 250 copies / mL. A negative result does not preclude SARS-CoV-2 infection and should not be used as the sole basis for treatment or other patient management decisions.  A negative result may occur with improper specimen collection / handling, submission of  specimen other than nasopharyngeal swab, presence of viral mutation(s) within the areas targeted by this assay, and inadequate number of viral copies (<250 copies / mL). A negative result must be combined with clinical observations, patient history, and epidemiological information. Fact Sheet for Patients:   StrictlyIdeas.no Fact Sheet for Healthcare Providers: BankingDealers.co.za This test is not yet approved or cleared  by the [place] FDA and has been authorized for detection and/or diagnosis of SARS-CoV-2 by FDA under an Emergency Use Authorization (EUA).  This EUA will remain in effect (meaning this test can be used) for the duration of the COVID-19 declaration under Section 564(b)(1) of the Act, 21 U.S.C. section 360bbb-3(b)(1), unless the authorization is terminated or revoked sooner. Performed at [facility], [place] [address] [place], [place] [address]   Magnesium     Status: None   Collection Time: 05/20/19  1:30 PM  Result Value Ref Range   Magnesium 1.7 1.7 - 2.4 mg/dL    Comment: Performed at [facility], [place] [address], [place], [place] [address]  Lipid panel     Status: Abnormal   Collection Time: 05/20/19  1:30 PM  Result Value Ref Range   Cholesterol 129 0 - 200 mg/dL   Triglycerides 137 <150 mg/dL   HDL 37 (L) >40 mg/dL   Total CHOL/HDL Ratio 3.5 RATIO   VLDL 27 0 - 40 mg/dL   LDL Cholesterol 65 0 - 99 mg/dL    Comment:        Total Cholesterol/HDL:CHD Risk Coronary Heart Disease Risk Table                     Men   Women  1/2 Average Risk   3.4   3.3  Average Risk       5.0   4.4  2 X Average Risk   9.6   7.1  3 X Average Risk  23.4   11.0        Use the calculated Patient Ratio above and the CHD Risk Table to determine the patient's CHD Risk.        ATP III CLASSIFICATION (LDL):  <100     mg/dL   Optimal  100-129  mg/dL   Near or Above  Optimal  130-159  mg/dL   Borderline  160-189  mg/dL   High  >190     mg/dL   Very High Performed at [place] [address], [place], [place] [address]    CT ABDOMEN PELVIS W CONTRAST  Result Date: 05/20/2019 CLINICAL DATA:  Upper left abdominal pain, nausea and vomiting EXAM: CT ABDOMEN AND PELVIS WITH CONTRAST TECHNIQUE: Multidetector CT imaging of the abdomen and pelvis was performed using the standard protocol following bolus administration of intravenous contrast. CONTRAST:  125mL OMNIPAQUE IOHEXOL 300 MG/ML  SOLN COMPARISON:  2019 FINDINGS: Lower chest: No acute abnormality. Hepatobiliary: No focal liver abnormality. Gallbladder distension is similar to the prior study. No biliary dilatation. Pancreas: Mild peripancreatic infiltration is present. Spleen: Unremarkable. Adrenals/Urinary Tract: Adrenals, kidneys, and bladder are unremarkable. Stomach/Bowel: Evidence of gastric bypass surgery. Bowel is normal in caliber. Vascular/Lymphatic: Mild aortic atherosclerosis. No enlarged lymph nodes identified. Reproductive: Fibroid uterus.  No adnexal mass. Other: No ascites.  No abdominal wall hernia. Musculoskeletal: Lumbar spine degenerative changes. IMPRESSION: Mild peripancreatic infiltration, which could reflect acute pancreatitis. No evidence of associated complication. Fibroid uterus. Electronically Signed   By: Macy Mis M.D.   On: 05/20/2019 10:55    Pending Labs Unresulted Labs (From admission, onward)    Start     Ordered   05/27/19 0500  Creatinine, serum  (enoxaparin (LOVENOX)    CrCl >/= 30 ml/min)  Weekly,   R    Comments: while on enoxaparin therapy    05/20/19 1142   05/21/19 0500  CBC  Tomorrow morning,   R     05/20/19 1142   05/21/19 0500  Comprehensive metabolic panel  Tomorrow morning,   R     05/20/19 1142   05/20/19 1141  HIV Antibody (routine testing w rflx)  (HIV Antibody (Routine testing w reflex) panel)  Once,   STAT     05/20/19 1142    05/20/19 0509  Urinalysis, Routine w reflex microscopic  ONCE - STAT,   STAT     05/20/19 0508          Vitals/Pain Today's Vitals   05/20/19 1341 05/20/19 1400 05/20/19 1500 05/20/19 1608  BP: 104/66 101/75 104/75 99/70  Pulse: 61 67 (!) 56 (!) 56  Resp: 16 18 16 18   Temp:      TempSrc:      SpO2: 98% 99% 100% 99%  Weight:      Height:      PainSc:        Isolation Precautions No active isolations  Medications Medications  hydrOXYzine (ATARAX/VISTARIL) tablet 25-50 mg (has no administration in time range)  enoxaparin (LOVENOX) injection 40 mg (40 mg Subcutaneous Given 05/20/19 1321)  0.9 %  sodium chloride infusion ( Intravenous New Bag/Given (Non-Interop) 05/20/19 1316)  acetaminophen (TYLENOL) tablet 650 mg (has no administration in time range)    Or  acetaminophen (TYLENOL) suppository 650 mg (has no administration in time range)  ondansetron (ZOFRAN) tablet 4 mg (has no administration in time range)    Or  ondansetron (ZOFRAN) injection 4 mg (has no administration in time range)  morphine 2 MG/ML injection 2 mg (has no administration in time range)  ondansetron (ZOFRAN-ODT) disintegrating tablet 4 mg (4 mg Oral Given 05/20/19 0530)  sodium chloride 0.9 % bolus 1,000 mL (0 mLs Intravenous Stopped 05/20/19 1317)  morphine 4 MG/ML injection 4 mg (4 mg Intravenous Given 05/20/19 0751)  sodium chloride 0.9 % bolus 1,000 mL (0  mLs Intravenous Stopped 05/20/19 1012)  morphine 4 MG/ML injection 4 mg (4 mg Intravenous Given 05/20/19 1009)  ondansetron (ZOFRAN) injection 4 mg (4 mg Intravenous Given 05/20/19 1009)  iohexol (OMNIPAQUE) 300 MG/ML solution 100 mL (100 mLs Intravenous Contrast Given 05/20/19 1027)    Mobility walks

## 2019-05-20 NOTE — H&P (Addendum)
History and Physical    Debra Jacobson [id number] DOB: [birth year] DOA: 05/20/2019  PCP: Tyson Dense, MD  Patient coming from: Home  I have personally briefly reviewed patient's old medical records in [place]  Chief Complaint: Abdominal pain  HPI: Debra Jacobson is a 59 y.o. female with medical history significant of chronic alcoholism and GERD and arthritis presented to ED with a complaint of worsening abdominal pain.  Cording to patient, she has intermittent chronic abdominal pain however since last 2 days, her pain has been worse than usual and has been constant along with nausea and intermittent vomiting to the point that she has not been able to take anything down for the last 1 day.  She denies any fever, chills, sweating, chest pain, shortness of breath or any other problem with bowel movements or urination.  According to her, she used to drink 1 can of 40 ounce beer daily however lately she has been drinking 2 cans almost 3 times a week.  Her last drink was on May 17, the day she started having worsening abdominal pain.  ED Course: Upon arrival to ED, she was hemodynamically stable.  Due to elevated LFTs and elevated lipase, CT abdomen and pelvis was done which confirmed acute pancreatitis but no other acute pathology.  Hospital service was consulted to admit the patient for further management.  Review of Systems: As per HPI otherwise negative.    Past Medical History:  Diagnosis Date  . Allergy    all rhinitis  . Dependent edema   . Dermatitis   . GERD (gastroesophageal reflux disease)   . Hypertension     Past Surgical History:  Procedure Laterality Date  . CESAREAN SECTION     x2  . TUBAL LIGATION       reports that she quit smoking about 27 years ago. Her smoking use included cigarettes. She has never used smokeless tobacco. She reports current alcohol use. She reports that she does not use drugs.  No Known Allergies  Family History  Problem  Relation Age of Onset  . Hyperlipidemia Mother     Prior to Admission medications   Medication Sig Start Date End Date Taking? Authorizing Provider  cyanocobalamin (,VITAMIN B-12,) 1000 MCG/ML injection INJECT 1 ML UNDER THE SKIN WEEKLY FOR 4 WEEKS, THEN 1ML UNDER THE SKIN EVERY 30 DAYS Patient taking differently: Inject 1,000 mcg into the skin every 30 (thirty) days.  05/11/19  Yes Brunetta Genera, MD  DUPIXENT 300 MG/2ML prefilled syringe Inject 300 mg into the skin every 14 (fourteen) days. 04/09/19  Yes [provider]  ergocalciferol (VITAMIN D2) 1.25 MG (50000 UT) capsule Take 1 capsule (50,000 Units total) by mouth once a week. 04/15/19  Yes Brunetta Genera, MD  hydrOXYzine (VISTARIL) 25 MG capsule Take 25-50 mg by mouth at bedtime as needed for sleep. 04/09/19  Yes [provider]  triamcinolone cream (KENALOG) 0.1 % Apply 1 application topically 4 (four) times daily as needed. Patient not taking: Reported on 03/08/2017 05/22/16   Clayton Bibles, [place]    Physical Exam: Vitals:   05/20/19 0448 05/20/19 0738 05/20/19 0930 05/20/19 1000  BP: (!) 138/98 (!) 126/93 122/84 126/86  Pulse: 83 70 63 66  Resp: 20 18 16 18   Temp: 97.9 F (36.6 C)     TempSrc: Oral     SpO2: 100% 100% 100% 99%  Weight: 68 kg     Height: 5\' 7"  (1.702 m)  Constitutional: NAD, calm, comfortable Vitals:   05/20/19 0448 05/20/19 0738 05/20/19 0930 05/20/19 1000  BP: (!) 138/98 (!) 126/93 122/84 126/86  Pulse: 83 70 63 66  Resp: 20 18 16 18   Temp: 97.9 F (36.6 C)     TempSrc: Oral     SpO2: 100% 100% 100% 99%  Weight: 68 kg     Height: 5\' 7"  (1.702 m)      Eyes: PERRL, lids and conjunctivae normal ENMT: Mucous membranes are moist. Posterior pharynx clear of any exudate or lesions.Normal dentition.  Neck: normal, supple, no masses, no thyromegaly Respiratory: clear to auscultation bilaterally, no wheezing, no crackles. Normal respiratory effort. No accessory muscle use.    Cardiovascular: Regular rate and rhythm, no murmurs / rubs / gallops. No extremity edema. 2+ pedal pulses. No carotid bruits.  Abdomen: Epigastric tenderness, no masses palpated. No hepatosplenomegaly. Bowel sounds positive.  Musculoskeletal: no clubbing / cyanosis. No joint deformity upper and lower extremities. Good ROM, no contractures. Normal muscle tone.  Skin: no rashes, lesions, ulcers. No induration Neurologic: CN 2-12 grossly intact. Sensation intact, DTR normal. Strength 5/5 in all 4.  Psychiatric: Normal judgment and insight. Alert and oriented x 3. Normal mood.    Labs on Admission: I have personally reviewed following labs and imaging studies  CBC: Recent Labs  Lab 05/20/19 0630  WBC 6.6  HGB 12.6  HCT 36.5  MCV 102.8*  PLT 123XX123*   Basic Metabolic Panel: Recent Labs  Lab 05/20/19 0630  NA 135  K 3.8  CL 103  CO2 22  GLUCOSE 103*  BUN 16  CREATININE 0.76  CALCIUM 8.7*   GFR: Estimated Creatinine Clearance: 73.6 mL/min (by C-G formula based on SCr of 0.76 mg/dL). Liver Function Tests: Recent Labs  Lab 05/20/19 0630  AST 857*  ALT 441*  ALKPHOS 149*  BILITOT 2.5*  PROT 7.8  ALBUMIN 3.6   Recent Labs  Lab 05/20/19 0630  LIPASE 198*   No results for input(s): AMMONIA in the last 168 hours. Coagulation Profile: No results for input(s): INR, PROTIME in the last 168 hours. Cardiac Enzymes: No results for input(s): CKTOTAL, CKMB, CKMBINDEX, TROPONINI in the last 168 hours. BNP (last 3 results) No results for input(s): PROBNP in the last 8760 hours. HbA1C: No results for input(s): HGBA1C in the last 72 hours. CBG: No results for input(s): GLUCAP in the last 168 hours. Lipid Profile: No results for input(s): CHOL, HDL, LDLCALC, TRIG, CHOLHDL, LDLDIRECT in the last 72 hours. Thyroid Function Tests: No results for input(s): TSH, T4TOTAL, FREET4, T3FREE, THYROIDAB in the last 72 hours. Anemia Panel: No results for input(s): VITAMINB12, FOLATE,  FERRITIN, TIBC, IRON, RETICCTPCT in the last 72 hours. Urine analysis:    Component Value Date/Time   BILIRUBINUR neg 03/01/2011 1108   PROTEINUR neg 03/01/2011 1108   UROBILINOGEN negative 03/01/2011 1108   NITRITE neg 03/01/2011 1108   LEUKOCYTESUR Negative 03/01/2011 1108    Radiological Exams on Admission: CT ABDOMEN PELVIS W CONTRAST  Result Date: 05/20/2019 CLINICAL DATA:  Upper left abdominal pain, nausea and vomiting EXAM: CT ABDOMEN AND PELVIS WITH CONTRAST TECHNIQUE: Multidetector CT imaging of the abdomen and pelvis was performed using the standard protocol following bolus administration of intravenous contrast. CONTRAST:  132mL OMNIPAQUE IOHEXOL 300 MG/ML  SOLN COMPARISON:  2019 FINDINGS: Lower chest: No acute abnormality. Hepatobiliary: No focal liver abnormality. Gallbladder distension is similar to the prior study. No biliary dilatation. Pancreas: Mild peripancreatic infiltration is present. Spleen: Unremarkable. Adrenals/Urinary Tract:  Adrenals, kidneys, and bladder are unremarkable. Stomach/Bowel: Evidence of gastric bypass surgery. Bowel is normal in caliber. Vascular/Lymphatic: Mild aortic atherosclerosis. No enlarged lymph nodes identified. Reproductive: Fibroid uterus.  No adnexal mass. Other: No ascites.  No abdominal wall hernia. Musculoskeletal: Lumbar spine degenerative changes. IMPRESSION: Mild peripancreatic infiltration, which could reflect acute pancreatitis. No evidence of associated complication. Fibroid uterus. Electronically Signed   By: Macy Mis M.D.   On: 05/20/2019 10:55    Assessment/Plan Active Problems:   Status post gastric bypass for obesity   Acute alcoholic pancreatitis   Alcohol dependence ([place])    Acute alcoholic pancreatitis: Based on the history, this is likely acute alcoholic pancreatitis.  CT abdomen negative for acute cholecystitis or gallstones.  Will check lipid panel to rule out hypertriglyceridemia.  She tells me that she is ready to  take liquids so I will start her on clears and advance her diet to full liquid diet for rest of the day today.  Will reassess tomorrow and if better than advance diet to soft and discharge home if tolerates.  She is a Marine scientist by profession and she thinks that she will be ready for discharge home tomorrow.  In the meantime, provide with pain medications as well as IV fluids.  Alcohol dependence: Drinks 3 times a week.  No signs of withdrawal currently.  Do not expect her to have withdrawal symptoms.  Monitor for now.  DVT prophylaxis: Lovenox Code Status: Full code Family Communication: None present at bedside.  Plan of care discussed with patient in length and he verbalized understanding and agreed with it. Disposition Plan: Likely home tomorrow Consults called: None Admission status: Observation   Status is: Observation  The patient remains OBS appropriate and will d/c before 2 midnights.  Dispo: The patient is from: Home              Anticipated d/c is to: Home              Anticipated d/c date is: 1 day              Patient currently is not medically stable to d/c.       Darliss Cheney MD [facility]  05/20/2019, 11:48 AM  To contact the attending provider between 7A-7P or the covering provider during after hours 7P-7A, please log into the web site [email or web address]

## 2019-05-21 DIAGNOSIS — F10288 Alcohol dependence with other alcohol-induced disorder: Secondary | ICD-10-CM

## 2019-05-21 DIAGNOSIS — Z9884 Bariatric surgery status: Secondary | ICD-10-CM

## 2019-05-21 LAB — CBC
HCT: 31.7 % — ABNORMAL LOW (ref 36.0–46.0)
Hemoglobin: 10.2 g/dL — ABNORMAL LOW (ref 12.0–15.0)
MCH: 35.3 pg — ABNORMAL HIGH (ref 26.0–34.0)
MCHC: 32.2 g/dL (ref 30.0–36.0)
MCV: 109.7 fL — ABNORMAL HIGH (ref 80.0–100.0)
Platelets: 101 10*3/uL — ABNORMAL LOW (ref 150–400)
RBC: 2.89 MIL/uL — ABNORMAL LOW (ref 3.87–5.11)
RDW: 15.2 % (ref 11.5–15.5)
WBC: 5.8 10*3/uL (ref 4.0–10.5)
nRBC: 0 % (ref 0.0–0.2)

## 2019-05-21 LAB — COMPREHENSIVE METABOLIC PANEL
ALT: 250 U/L — ABNORMAL HIGH (ref 0–44)
AST: 304 U/L — ABNORMAL HIGH (ref 15–41)
Albumin: 2.6 g/dL — ABNORMAL LOW (ref 3.5–5.0)
Alkaline Phosphatase: 110 U/L (ref 38–126)
Anion gap: 6 (ref 5–15)
BUN: 8 mg/dL (ref 6–20)
CO2: 21 mmol/L — ABNORMAL LOW (ref 22–32)
Calcium: 8 mg/dL — ABNORMAL LOW (ref 8.9–10.3)
Chloride: 107 mmol/L (ref 98–111)
Creatinine, Ser: 0.68 mg/dL (ref 0.44–1.00)
GFR calc Af Amer: >60 mL/min (ref >60)
GFR calc non Af Amer: >60 mL/min (ref >60)
Glucose, Bld: 87 mg/dL (ref 70–99)
Potassium: 4.6 mmol/L (ref 3.5–5.1)
Sodium: 134 mmol/L — ABNORMAL LOW (ref 135–145)
Total Bilirubin: 1.4 mg/dL — ABNORMAL HIGH (ref 0.3–1.2)
Total Protein: 6.1 g/dL — ABNORMAL LOW (ref 6.5–8.1)

## 2019-05-21 NOTE — Progress Notes (Signed)
  Pt is being discharged home today. Discharge instructions including medications and follow up appointments given. Pt had no further questions at this time. 

## 2019-05-21 NOTE — Discharge Summary (Signed)
[place]  MR#: [id number]  DOB:December 04, 1960  Date of Admission: 05/20/2019 Date of Discharge: 05/21/2019  Attending Physician:Katlynn Naser Hennie Duos, MD  Patient's [id number], Debra Benton, MD  Consults: none  Disposition: d/c home  Follow-up Appts: Follow-up Information    Tyson Dense, MD Follow up in 1 week(s).   Specialty: Obstetrics and Gynecology Contact information: [address] [place] [address] [phone]           Tests Needing Follow-up: -assess patient for abstinence from alcohol  Discharge Diagnoses: Acute alcoholic pancreatitis Alcohol abuse Transaminitis  Initial presentation: 59 year old with a history of chronic alcoholism, GERD, and arthritis who presented to the ED with complaints of abdominal pain.  She reported chronic intermittent abdominal pain but related acute significant worsening over the last 48 hours associated with nausea and intermittent vomiting to the point that she was not able to tolerate oral intake.  She continues to drink 2-3 40 ounce beers at least 3 times a week.  In the ED CT abdomen and pelvis confirmed acute pancreatitis but no other acute findings were noted.  Hospital Course:  Acute alcoholic pancreatitis CT abdomen/pelvis without other acute findings - pain essentially resolved at time of d/c w/ pt able to tolerate full liquids w/o difficulty   Alcohol abuse Drinks 80-120 ounces of beer at least 3 times a week -counseled on absolute need to abstain entirely from alcohol and direct connection to pancreatitis - pt voiced understanding   Transaminitis LFTs trending downward in absence of alcohol   Allergies as of 05/21/2019   No Known Allergies     Medication List    TAKE these medications   cyanocobalamin 1000 MCG/ML injection Commonly known as: (VITAMIN B-12) INJECT 1 ML UNDER THE SKIN WEEKLY FOR 4 WEEKS, THEN 1ML UNDER THE SKIN EVERY 30 DAYS What changed: See  the new instructions.   Dupixent 300 MG/2ML prefilled syringe Generic drug: dupilumab Inject 300 mg into the skin every 14 (fourteen) days.   ergocalciferol 1.25 MG (50000 UT) capsule Commonly known as: VITAMIN D2 Take 1 capsule (50,000 Units total) by mouth once a week.   hydrOXYzine 25 MG capsule Commonly known as: VISTARIL Take 25-50 mg by mouth at bedtime as needed for sleep.   triamcinolone cream 0.1 % Commonly known as: KENALOG Apply 1 application topically 4 (four) times daily as needed.       Day of Discharge BP 112/88   Pulse (!) 57   Temp 98.5 F (36.9 C) (Oral)   Resp 18   Ht 5\' 7"  (1.702 m)   Wt 69.2 kg   LMP 05/01/2012   SpO2 100%   BMI 23.89 kg/m   Physical Exam: General: No acute respiratory distress Lungs: Clear to auscultation bilaterally without wheezes or crackles Cardiovascular: Regular rate and rhythm without murmur gallop or rub normal S1 and S2 Abdomen: Nontender, nondistended, soft, bowel sounds positive, no rebound, no ascites, no appreciable mass Extremities: No significant cyanosis, clubbing, or edema bilateral lower extremities  Basic Metabolic Panel: Recent Labs  Lab 05/20/19 0630 05/20/19 1330 05/21/19 0553  NA 135  --  134*  K 3.8  --  4.6  CL 103  --  107  CO2 22  --  21*  GLUCOSE 103*  --  87  BUN 16  --  8  CREATININE 0.76  --  0.68  CALCIUM 8.7*  --  8.0*  MG  --  1.7  --  Liver Function Tests: Recent Labs  Lab 05/20/19 0630 05/21/19 0553  AST 857* 304*  ALT 441* 250*  ALKPHOS 149* 110  BILITOT 2.5* 1.4*  PROT 7.8 6.1*  ALBUMIN 3.6 2.6*   Recent Labs  Lab 05/20/19 0630  LIPASE 198*    CBC: Recent Labs  Lab 05/20/19 0630 05/21/19 0553  WBC 6.6 5.8  HGB 12.6 10.2*  HCT 36.5 31.7*  MCV 102.8* 109.7*  PLT 126* 101*     Recent Results (from the past 240 hour(s))  SARS Coronavirus 2 by RT PCR (hospital order, performed in [facility] hospital lab) Nasopharyngeal Nasopharyngeal Swab     Status:  None   Collection Time: 05/20/19  7:45 AM   Specimen: Nasopharyngeal Swab  Result Value Ref Range Status   SARS Coronavirus 2 NEGATIVE NEGATIVE Final    Comment: (NOTE) SARS-CoV-2 target nucleic acids are NOT DETECTED. The SARS-CoV-2 RNA is generally detectable in upper and lower respiratory specimens during the acute phase of infection. The lowest concentration of SARS-CoV-2 viral copies this assay can detect is 250 copies / mL. A negative result does not preclude SARS-CoV-2 infection and should not be used as the sole basis for treatment or other patient management decisions.  A negative result may occur with improper specimen collection / handling, submission of specimen other than nasopharyngeal swab, presence of viral mutation(s) within the areas targeted by this assay, and inadequate number of viral copies (<250 copies / mL). A negative result must be combined with clinical observations, patient history, and epidemiological information. Fact Sheet for Patients:   StrictlyIdeas.no Fact Sheet for Healthcare Providers: BankingDealers.co.za This test is not yet approved or cleared  by the [place] FDA and has been authorized for detection and/or diagnosis of SARS-CoV-2 by FDA under an Emergency Use Authorization (EUA).  This EUA will remain in effect (meaning this test can be used) for the duration of the COVID-19 declaration under Section 564(b)(1) of the Act, 21 U.S.C. section 360bbb-3(b)(1), unless the authorization is terminated or revoked sooner. Performed at [facility], [place] [address] [place], [place] [address]      Time spent in discharge (includes decision making & examination of pt): 30 minutes  05/21/2019, 1:32 PM   Debra Altes, MD [facility] Office  [phone]

## 2019-05-21 NOTE — Discharge Instructions (Signed)
Acute Pancreatitis  The pancreas is a gland that is located behind the stomach on the left side of the abdomen. It produces enzymes that help to digest food. The pancreas also releases the hormones glucagon and insulin, which help to regulate blood sugar. Acute pancreatitis happens when inflammation of the pancreas suddenly occurs and the pancreas becomes irritated and swollen. Most acute attacks last a few days and cause serious problems. Some people become dehydrated and develop low blood pressure. In severe cases, bleeding in the abdomen can lead to shock and can be life-threatening. The lungs, heart, and kidneys may fail. What are the causes? This condition may be caused by:  Alcohol abuse.  Drug abuse.  Gallstones or other conditions that can block the tube that drains the pancreas (pancreatic duct).  A tumor in the pancreas. Other causes include:  Certain medicines.  Exposure to certain chemicals.  Diabetes.  An infection in the pancreas.  Damage caused by an accident (trauma).  The poison (venom) from a scorpion bite.  Abdominal surgery.  Autoimmune pancreatitis. This is when the body's disease-fighting (immune) system attacks the pancreas.  Genes that are passed from parent to child (inherited). In some cases, the cause of this condition is not known. What are the signs or symptoms? Symptoms of this condition include:  Pain in the upper abdomen that may radiate to the back. Pain may be severe.  Tenderness and swelling of the abdomen.  Nausea and vomiting.  Fever. How is this diagnosed? This condition may be diagnosed based on:  A physical exam.  Blood tests.  Imaging tests, such as X-rays, CT or MRI scans, or an ultrasound of the abdomen. How is this treated? Treatment for this condition usually requires a stay in the hospital. Treatment for this condition may include:  Pain medicine.  Fluid replacement through an IV.  Placing a tube in the stomach  to remove stomach contents and to control vomiting (NG tube, or nasogastric tube).  Not eating for 3-4 days. This gives the pancreas a rest, because enzymes are not being produced that can cause further damage.  Antibiotic medicines, if your condition is caused by an infection.  Treating any underlying conditions that may be the cause.  Steroid medicines, if your condition is caused by your immune system attacking your body's own tissues (autoimmune disease).  Surgery on the pancreas or gallbladder. Follow these instructions at home: Eating and drinking   Follow instructions from your health care provider about diet. This may involve avoiding alcohol and decreasing the amount of fat in your diet.  Eat smaller, more frequent meals. This reduces the amount of digestive fluids that the pancreas produces.  Drink enough fluid to keep your urine pale yellow.  Do not drink alcohol if it caused your condition. General instructions  Take over-the-counter and prescription medicines only as told by your health care provider.  Do not drive or use heavy machinery while taking prescription pain medicine.  Ask your health care provider if the medicine prescribed to you can cause constipation. You may need to take steps to prevent or treat constipation, such as: ? Take an over-the-counter or prescription medicine for constipation. ? Eat foods that are high in fiber such as whole grains and beans. ? Limit foods that are high in fat and processed sugars, such as fried or sweet foods.  Do not use any products that contain nicotine or tobacco, such as cigarettes, e-cigarettes, and chewing tobacco. If you need help quitting, ask your   health care provider.  Get plenty of rest.  If directed, check your blood sugar at home as told by your health care provider.  Keep all follow-up visits as told by your health care provider. This is important. Contact a health care provider if you:  Do not recover  as quickly as expected.  Develop new or worsening symptoms.  Have persistent pain, weakness, or nausea.  Recover and then have another episode of pain.  Have a fever. Get help right away if:  You cannot eat or keep fluids down.  Your pain becomes severe.  Your skin or the white part of your eyes turns yellow (jaundice).  You have sudden swelling in your abdomen.  You vomit.  You feel dizzy or you faint.  Your blood sugar is high (over 300 mg/dL). Summary  Acute pancreatitis happens when inflammation of the pancreas suddenly occurs and the pancreas becomes irritated and swollen.  This condition is typically caused by alcohol abuse, drug abuse, or gallstones.  Treatment for this condition usually requires a stay in the hospital. This information is not intended to replace advice given to you by your health care provider. Make sure you discuss any questions you have with your health care provider. Document Revised: 10/07/2017 Document Reviewed: 06/24/2017 Elsevier Patient Education  [place].   Substance Use Disorder Substance use disorder occurs when a person's repeated use of drugs or alcohol interferes with his or her ability to be productive. This disorder can cause problems with mental and physical health. It can affect your ability to have healthy relationships, and it can keep you from being able to meet your responsibilities at work, home, or school. It can also lead to addiction, which is a condition in which the person cannot stop using the substance consistently for a period of time. Addiction changes the way the brain works. Because of these changes, addiction is a chronic condition. Substance use disorder can be mild, moderate, or severe. The most commonly abused substances include:  Alcohol.  Tobacco.  Marijuana.  Stimulants, such as cocaine and methamphetamine.  Hallucinogens, such as LSD and PCP.  Opioids, such as some prescription pain  medicines and heroin. What are the causes? This condition may develop due to many complex social, psychological, or physical reasons, such as:  Stress.  Abuse.  Peer pressure.  Anxiety or depression. What increases the risk? This condition is more likely to develop in people who:  Use substances to cope with stress.  Have been abused.  Have a mental health disorder, such as depression.  Have a family history of substance use disorder. What are the signs or symptoms? Symptoms of this condition include:  Using the substance for longer periods of time or at a higher dosage than what is normal or intended.  Having a lasting desire to use the substance.  Being unable to slow down or stop the use of the substance.  Spending an abnormal amount of time getting the substance, using the substance, or recovering from using the substance.  Using the substance in a way that interferes with work, school, social activities, and personal relationships.  Using the substance even after having negative consequences, such as: ? Health problems. ? Legal or financial troubles. ? Job loss. ? Relationship problems.  Needing more and more of the substance to get the same effect (developing tolerance).  Experiencing unpleasant symptoms if you do not use the substance (withdrawal).  Using the substance to avoid withdrawal symptoms. How is this  diagnosed? This condition may be diagnosed based on:  A physical exam.  Your history of substance use.  Your symptoms. This includes: ? How substance use affects your life. ? Changes in personality, behaviors, and mood. ? Having at least two symptoms of substance use disorder within a 66-month period. ? Health issues related to substance use, such as liver damage, shortness of breath, fatigue, cough, or heart problems.  Blood or urine tests to screen for alcohol and drugs. How is this treated? This condition may be treated by:  Stopping  substance use safely. This may require taking medicines and being closely monitored for several days.  Taking part in group and individual counseling from mental health providers who help people with substance use disorder.  Staying at a live-in (residential) treatment center for several days or weeks.  Attending daily counseling sessions at a treatment center.  Taking medicine as told by your health care provider: ? To ease symptoms and prevent complications during withdrawal. ? To treat other mental health issues, such as depression or anxiety. ? To block cravings by causing the same effects as the substance. ? To block the effects of the substance or replace good sensations with unpleasant ones.  Participating in a support group to share your experience with others who are going through the same thing. These groups are an important part of long-term recovery for many people. Recovery can be a long process. Many people who undergo treatment start using the substance again after stopping (relapse). If you relapse, that does not mean that treatment will not work. Follow these instructions at home:   Take over-the-counter and prescription medicines only as told by your health care provider.  Do not use any drugs or alcohol.  Avoid temptations or triggers that you associate with your use of the substance.  Learn and practice techniques for managing stress.  Have a plan for vulnerable moments. Get phone numbers of people who are willing to help and who are committed to your recovery.  Attend support groups on a regular basis. These groups include 12-step programs like Alcoholics Anonymous and Narcotics Anonymous.  Keep all follow-up visits as told by your health care providers. This is important. This includes continuing to work with therapists and support groups. Contact a health care provider if:  You cannot take your medicines as told.  Your symptoms get worse.  You have trouble  resisting the urge to use drugs or alcohol. Get help right away if you:  Relapse.  Think that you may have taken too much of a drug. The hotline of the [facility] is 608-587-7324.  Have signs of an overdose. Symptoms include: ? Chest pain. ? Confusion. ? Sleepiness or difficulty staying awake. ? Slowed breathing. ? Nausea or vomiting. ? A seizure.  Have serious thoughts about hurting yourself or someone else. Drug overdose is an emergency. Do not wait to see if the symptoms will go away. Get medical help right away. Call your local emergency services (911 in the [place] Do not drive yourself to the hospital. If you ever feel like you may hurt yourself or others, or have thoughts about taking your own life, get help right away. You can go to your nearest emergency department or call:  Your local emergency services (911 in the [place]  A suicide crisis helpline, such as the [facility] at 732-472-8217. This is open 24 hours a day. Summary  Substance use disorder occurs when a person's repeated  use of drugs or alcohol interferes with his or her ability to be productive.  Taking part in group and individual counseling from mental health providers is a common treatment for people with substance use disorder.  Recovery can be a long process. Many people who undergo treatment start using the substance again after stopping (relapse). A relapse does not mean that treatment will not work.  Attend support groups such as Alcoholics Anonymous and Narcotics Anonymous. These groups are an important part of long-term recovery for many people. This information is not intended to replace advice given to you by your health care provider. Make sure you discuss any questions you have with your health care provider. Document Revised: 04/10/2018 Document Reviewed: 01/29/2017 Elsevier Patient Education  2020 [employer].

## 2019-07-15 ENCOUNTER — Inpatient Hospital Stay: Payer: Self-pay | Admitting: Hematology

## 2019-07-15 ENCOUNTER — Inpatient Hospital Stay: Payer: Self-pay | Attending: Hematology

## 2019-07-17 ENCOUNTER — Telehealth: Payer: Self-pay | Admitting: Hematology

## 2019-07-17 NOTE — Telephone Encounter (Signed)
Called pt per 7/15 sch msg - per pt she is unable to reschedule at the moment. Will give [place] a call back when she can reschedule.

## 2019-09-25 ENCOUNTER — Other Ambulatory Visit: Payer: Self-pay | Admitting: Obstetrics and Gynecology

## 2019-09-25 DIAGNOSIS — Z1231 Encounter for screening mammogram for malignant neoplasm of breast: Secondary | ICD-10-CM

## 2019-10-09 ENCOUNTER — Ambulatory Visit: Payer: Self-pay

## 2020-04-20 ENCOUNTER — Telehealth: Payer: Self-pay | Admitting: Hematology

## 2020-04-20 NOTE — Telephone Encounter (Signed)
Scheduled appts per 4/20 sch msg. Pt aware.  

## 2020-04-22 ENCOUNTER — Other Ambulatory Visit: Payer: Self-pay

## 2020-04-22 DIAGNOSIS — C9000 Multiple myeloma not having achieved remission: Secondary | ICD-10-CM

## 2020-04-22 DIAGNOSIS — D472 Monoclonal gammopathy: Secondary | ICD-10-CM

## 2020-04-24 NOTE — Progress Notes (Incomplete)
HEMATOLOGY/ONCOLOGY CLINIC NOTE  Date of Service: 04/24/2020  Patient Care Team: Tyson Dense, MD as PCP - General (Obstetrics and Gynecology)  CHIEF COMPLAINTS/PURPOSE OF CONSULTATION:  Anemia  HISTORY OF PRESENTING ILLNESS:   Debra Jacobson is a wonderful 60 y.o. female who has been referred to [place] by Dr Lucillie Garfinkel for evaluation and management of anemia. The pt reports that she is doing well overall.  The pt reports that she was first given the diagnoses of anemia about 10 years ago. Pt is currently taking 1 iron pill every other day. Pt had a gastric bypass surgery in 2014. She initially weighed about 400 lbs and is currently 164 lbs. She has been experiencing a severe lack of appetite and weight loss. When she does attempt to eat she can only take a couple of bites and then has a bowel movement soon after that. She also has abdominal pain after she eats. Pt has seasonal allergies and eczema, for which she has been taking Dupixent for 3 months. Pt has never had a skin biopsy to confirm her eczema diagnosis. It was dormant for about 10 years and came back 2 years ago after an interaction with a cat and has persisted. Pt also takes Vistaril to help with the itch. Pt has never had an allergy test or immune therapy testing. Pt took steroid shots for her carpal tunnel. There were pre-cancerous cells found in pt's pap smear and her Gynecologist is planning on doing a resection. Pt was experiencing black stools a couple of months ago, that were not painful and did not speak with her PCP about it. She had a colonoscopy in 2014 and has never had an endoscopy. Pt believes her ankle swelling is from work and wears compression socks which helps.   Most recent lab results (08/12/2018) of CBC is as follows: WBC at 3.8K, RBC at 2.49, Hgb at 7.8, HCT at 24.3, MCV at 98, MCH at 31.3, MCHC at 32.1, RDW at 15.9, Platelets at 182K.  08/12/2018 Transferrin is 286 08/12/2018 Ferritin is 30   08/12/2018 Iron and TIBC shows Iron Bind, Cap, (TIBC) at 349, UIBC at 303, Iron at 46, Iron Sat at 13.   On review of systems, pt reports pain in her left knee, weight loss, fatigue, enlarged cervical lymph nodes, black/bloody stools, abdominal pain and denies vaginal bleeds, nose bleeds gum bleeds, chest pain, SOB, fevers, chills and any other symptoms.   On PMHx the pt reports gastric bypass surgery, two cesarean sections, tubal ligation.  On Social Hx the pt reports that she has quit smoking, no alcohol use.   INTERVAL HISTORY:   Debra Jacobson is a wonderful 60 y.o. female who is here today for evaluation and management of anemia. The patient's last visit with [place] was on 04/15/2019. The pt reports that she is doing well overall.  The pt reports ***  Lab results today 04/25/2020 of CBC w/diff and CMP is as follows: all values are WNL except for ***  On review of systems, pt reports *** and denies *** and any other symptoms.  MEDICAL HISTORY:  Past Medical History:  Diagnosis Date  . Allergy    all rhinitis  . Dependent edema   . Dermatitis   . GERD (gastroesophageal reflux disease)   . Hypertension     SURGICAL HISTORY: Past Surgical History:  Procedure Laterality Date  . CESAREAN SECTION     x2  . TUBAL LIGATION  SOCIAL HISTORY: Social History   Socioeconomic History  . Marital status: Married    Spouse name: Not on file  . Number of children: Not on file  . Years of education: Not on file  . Highest education level: Not on file  Occupational History  . Not on file  Tobacco Use  . Smoking status: Former Smoker    Types: Cigarettes    Quit date: 08/29/1991    Years since quitting: 28.6  . Smokeless tobacco: Never Used  Vaping Use  . Vaping Use: Never used  Substance and Sexual Activity  . Alcohol use: Yes    Comment: Patient drinks 2 40ox beers multiple days a week  . Drug use: No  . Sexual activity: Not on file  Other Topics Concern  . Not on file   Social History Narrative  . Not on file   Social Determinants of Health   Financial Resource Strain: Not on file  Food Insecurity: Not on file  Transportation Needs: Not on file  Physical Activity: Not on file  Stress: Not on file  Social Connections: Not on file  Intimate Partner Violence: Not on file    FAMILY HISTORY: Family History  Problem Relation Age of Onset  . Hyperlipidemia Mother     ALLERGIES:  has No Known Allergies.  MEDICATIONS:  Current Outpatient Medications  Medication Sig Dispense Refill  . cyanocobalamin (,VITAMIN B-12,) 1000 MCG/ML injection INJECT 1 ML UNDER THE SKIN WEEKLY FOR 4 WEEKS, THEN 1ML UNDER THE SKIN EVERY 30 DAYS (Patient taking differently: Inject 1,000 mcg into the skin every 30 (thirty) days. ) 6 mL 1  . DUPIXENT 300 MG/2ML prefilled syringe Inject 300 mg into the skin every 14 (fourteen) days.    . ergocalciferol (VITAMIN D2) 1.25 MG (50000 UT) capsule Take 1 capsule (50,000 Units total) by mouth once a week. 12 capsule 4  . hydrOXYzine (VISTARIL) 25 MG capsule Take 25-50 mg by mouth at bedtime as needed for sleep.    Marland Kitchen triamcinolone cream (KENALOG) 0.1 % Apply 1 application topically 4 (four) times daily as needed. (Patient not taking: Reported on 03/08/2017) 30 g 0   No current facility-administered medications for this visit.    REVIEW OF SYSTEMS:   10 Point review of Systems was done is negative except as noted above.   PHYSICAL EXAMINATION: ECOG PERFORMANCE STATUS: 1 - Symptomatic but completely ambulatory  . There were no vitals filed for this visit. There were no vitals filed for this visit. .There is no height or weight on file to calculate BMI.  *** GENERAL:alert, in no acute distress and comfortable SKIN: no acute rashes, no significant lesions EYES: conjunctiva are pink and non-injected, sclera anicteric OROPHARYNX: MMM, no exudates, no oropharyngeal erythema or ulceration NECK: supple, no JVD LYMPH:  no palpable  lymphadenopathy in the cervical, axillary or inguinal regions LUNGS: clear to auscultation b/l with normal respiratory effort HEART: regular rate & rhythm ABDOMEN:  normoactive bowel sounds , non tender, not distended. No palpable hepatosplenomegaly.  Extremity: no pedal edema PSYCH: alert & oriented x 3 with fluent speech NEURO: no focal motor/sensory deficits  LABORATORY DATA:  I have reviewed the data as listed  . CBC Latest Ref Rng & Units 05/21/2019 05/20/2019 04/15/2019  WBC 4.0 - 10.5 K/uL 5.8 6.6 4.5  Hemoglobin 12.0 - 15.0 g/dL 10.2(L) 12.6 11.8(L)  Hematocrit 36.0 - 46.0 % 31.7(L) 36.5 36.4  Platelets 150 - 400 K/uL 101(L) 126(L) 210   . CBC  Component Value Date/Time   WBC 5.8 05/21/2019 0553   RBC 2.89 (L) 05/21/2019 0553   HGB 10.2 (L) 05/21/2019 0553   HGB 9.5 (L) 10/07/2018 1358   HCT 31.7 (L) 05/21/2019 0553   HCT 25.2 (L) 09/01/2018 1224   PLT 101 (L) 05/21/2019 0553   PLT 181 10/07/2018 1358   MCV 109.7 (H) 05/21/2019 0553   MCH 35.3 (H) 05/21/2019 0553   MCHC 32.2 05/21/2019 0553   RDW 15.2 05/21/2019 0553   LYMPHSABS 1.7 04/15/2019 1039   MONOABS 0.3 04/15/2019 1039   EOSABS 0.1 04/15/2019 1039   BASOSABS 0.0 04/15/2019 1039     . CMP Latest Ref Rng & Units 05/21/2019 05/20/2019 04/15/2019  Glucose 70 - 99 mg/dL 87 103(H) 77  BUN 6 - 20 mg/dL $Remove'8 16 14  '[id number]$ Creatinine 0.44 - 1.00 mg/dL 0.68 0.76 0.92  Sodium 135 - 145 mmol/L 134(L) 135 140  Potassium 3.5 - 5.1 mmol/L 4.6 3.8 4.1  Chloride 98 - 111 mmol/L 107 103 111  CO2 22 - 32 mmol/L 21(L) 22 21(L)  Calcium 8.9 - 10.3 mg/dL 8.0(L) 8.7(L) 8.5(L)  Total Protein 6.5 - 8.1 g/dL 6.1(L) 7.8 7.9  Total Bilirubin 0.3 - 1.2 mg/dL 1.4(H) 2.5(H) 0.4  Alkaline Phos 38 - 126 U/L 110 149(H) 118  AST 15 - 41 U/L 304(H) 857(H) 95(H)  ALT 0 - 44 U/L 250(H) 441(H) 65(H)   . Lab Results  Component Value Date   IRON 106 04/15/2019   TIBC 324 04/15/2019   IRONPCTSAT 33 04/15/2019   (Iron and TIBC)  Lab Results   Component Value Date   FERRITIN 72 04/15/2019   09/22/2018 Bone Marrow Report   09/22/2018 Bone Marrow report    09/22/2018 FISH Analysis    09/22/2018 Cytogenetics   RADIOGRAPHIC STUDIES: I have personally reviewed the radiological images as listed and agreed with the findings in the report. No results found.  ASSESSMENT & PLAN:   1) Cannot r/o RBC Macrocytosis, likely due to Vitamin B12 deficiency caused by Gastric bypass surgery - must r/o bone marrow infiltrative process due to MM  2) Iron deficiency- GI blood loss vs poor absorption related to Gastric bypass surgery. 09/17/2018 Occult blood card to lab is "POSITIVE" x2.  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 15% Plasma cells" 4) Previous Jehovah's Witness - does not want a blood transfusion, even under life threatening conditions -Okay with erythropoietin products 5) Abnormal LFTs - ETOH + fatty liver   PLAN: -Discussed pt labwork today, 04/25/2020; ***   -Recommend pt take Vitamin D supplement for bone health  -Recommend pt begin B-complex vitamin daily  -Continue monthly [place] Vitamin B12 injections  -Will see back in ***   FOLLOW UP: ***   The total time spent in the appt was *** minutes and more than 50% was on counseling and direct patient cares.  All of the patient's questions were answered with apparent satisfaction. The patient knows to call the clinic with any problems, questions or concerns.    Sullivan Lone MD [place] AAHIVMS [facility] Hematology/Oncology Physician [facility]  (Office):       [phone] (Work cell):  [phone] (Fax):           [phone]  04/24/2020  9:59  AM  I, Reinaldo Raddle, am acting as scribe for Dr. Sullivan Lone, MD.

## 2020-04-25 ENCOUNTER — Inpatient Hospital Stay: Payer: No Typology Code available for payment source

## 2020-04-25 ENCOUNTER — Inpatient Hospital Stay: Payer: No Typology Code available for payment source | Attending: Hematology | Admitting: Hematology

## 2020-07-07 ENCOUNTER — Telehealth: Payer: Self-pay | Admitting: Hematology

## 2020-07-07 NOTE — Telephone Encounter (Signed)
Scheduled appts per 7/7 sch msg. Pt aware.  

## 2020-08-04 NOTE — Progress Notes (Signed)
HEMATOLOGY/ONCOLOGY CLINIC NOTE  Date of Service: .08/05/2020   Patient Care Team: Tyson Dense, MD as PCP - General (Obstetrics and Gynecology)  CHIEF COMPLAINTS/PURPOSE OF CONSULTATION:  Smoldering myeloma  HISTORY OF PRESENTING ILLNESS:   Debra Jacobson is a wonderful 60 y.o. female who has been referred to [place] by Dr Lucillie Garfinkel for evaluation and management of anemia. The pt reports that she is doing well overall.  The pt reports that she was first given the diagnoses of anemia about 10 years ago. Pt is currently taking 1 iron pill every other day. Pt had a gastric bypass surgery in 2014. She initially weighed about 400 lbs and is currently 164 lbs. She has been experiencing a severe lack of appetite and weight loss. When she does attempt to eat she can only take a couple of bites and then has a bowel movement soon after that. She also has abdominal pain after she eats. Pt has seasonal allergies and eczema, for which she has been taking Dupixent for 3 months. Pt has never had a skin biopsy to confirm her eczema diagnosis. It was dormant for about 10 years and came back 2 years ago after an interaction with a cat and has persisted. Pt also takes Vistaril to help with the itch. Pt has never had an allergy test or immune therapy testing. Pt took steroid shots for her carpal tunnel. There were pre-cancerous cells found in pt's pap smear and her Gynecologist is planning on doing a resection. Pt was experiencing black stools a couple of months ago, that were not painful and did not speak with her PCP about it. She had a colonoscopy in 2014 and has never had an endoscopy. Pt believes her ankle swelling is from work and wears compression socks which helps.   Most recent lab results (08/12/2018) of CBC is as follows: WBC at 3.8K, RBC at 2.49, Hgb at 7.8, HCT at 24.3, MCV at 98, MCH at 31.3, MCHC at 32.1, RDW at 15.9, Platelets at 182K.  08/12/2018 Transferrin is 286 08/12/2018 Ferritin  is 30  08/12/2018 Iron and TIBC shows Iron Bind, Cap, (TIBC) at 349, UIBC at 303, Iron at 46, Iron Sat at 13.   On review of systems, pt reports pain in her left knee, weight loss, fatigue, enlarged cervical lymph nodes, black/bloody stools, abdominal pain and denies vaginal bleeds, nose bleeds gum bleeds, chest pain, SOB, fevers, chills and any other symptoms.   On PMHx the pt reports gastric bypass surgery, two cesarean sections, tubal ligation.  On Social Hx the pt reports that she has quit smoking, no alcohol use.   INTERVAL HISTORY:   Debra Jacobson is a wonderful 60 y.o. female who is here today for evaluation and management of anemia. The patient's last visit with [place] was on 04/15/2019. She wa slost to followup. 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  The pt reports left thigh pain for the last few months and rt lower extremity swelling for the last few weeks. She has been taking a lot of Ibuprofen for her left thigh pain. Does not have a PCP and recommended to get one ASAP  Lab results today 08/05/2020 of CBC w/diff and CMP- reviewed with the patient.  On review of systems, pt reports no sudden weight loss . She notes she has reduced ETOH intake. No overt bleeding. NO GI bleeding, epistaxis or other over bleeding.   MEDICAL HISTORY:  Past Medical  History:  Diagnosis Date   Allergy    all rhinitis   Dependent edema    Dermatitis    GERD (gastroesophageal reflux disease)    Hypertension     SURGICAL HISTORY: Past Surgical History:  Procedure Laterality Date   CESAREAN SECTION     x2   TUBAL LIGATION      SOCIAL HISTORY: Social History   Socioeconomic History   Marital status: Married    Spouse name: Not on file   Number of children: Not on file   Years of education: Not on file   Highest education level: Not on file  Occupational History   Not on file  Tobacco Use   Smoking status: Former    Types: Cigarettes    Quit date:  08/29/1991    Years since quitting: 28.9   Smokeless tobacco: Never  Vaping Use   Vaping Use: Never used  Substance and Sexual Activity   Alcohol use: Yes    Comment: Patient drinks 2 40ox beers multiple days a week   Drug use: No   Sexual activity: Not on file  Other Topics Concern   Not on file  Social History Narrative   Not on file   Social Determinants of Health   Financial Resource Strain: Not on file  Food Insecurity: Not on file  Transportation Needs: Not on file  Physical Activity: Not on file  Stress: Not on file  Social Connections: Not on file  Intimate Partner Violence: Not on file    FAMILY HISTORY: Family History  Problem Relation Age of Onset   Hyperlipidemia Mother     ALLERGIES:  has No Known Allergies.  MEDICATIONS:  Current Outpatient Medications  Medication Sig Dispense Refill   cyanocobalamin (,VITAMIN B-12,) 1000 MCG/ML injection INJECT 1 ML UNDER THE SKIN WEEKLY FOR 4 WEEKS, THEN 1ML UNDER THE SKIN EVERY 30 DAYS (Patient taking differently: Inject 1,000 mcg into the skin every 30 (thirty) days. ) 6 mL 1   DUPIXENT 300 MG/2ML prefilled syringe Inject 300 mg into the skin every 14 (fourteen) days.     ergocalciferol (VITAMIN D2) 1.25 MG (50000 UT) capsule Take 1 capsule (50,000 Units total) by mouth once a week. 12 capsule 4   hydrOXYzine (VISTARIL) 25 MG capsule Take 25-50 mg by mouth at bedtime as needed for sleep.     triamcinolone cream (KENALOG) 0.1 % Apply 1 application topically 4 (four) times daily as needed. (Patient not taking: Reported on 03/08/2017) 30 g 0   No current facility-administered medications for this visit.    REVIEW OF SYSTEMS:   10 Point review of Systems was done is negative except as noted above.  PHYSICAL EXAMINATION: ECOG PERFORMANCE STATUS: 1 - Symptomatic but completely ambulatory  . Vitals:   08/05/20 1003  BP: (!) 126/97  Pulse: 61  Resp: 17  Temp: 98 F (36.7 C)  SpO2: 100%    Filed Weights   08/05/20  1003  Weight: 162 lb 8 oz (73.7 kg)    .Body mass index is 25.45 kg/m.  NAD GENERAL:alert, in no acute distress and comfortable SKIN: no acute rashes, no significant lesions EYES: conjunctiva are pink and non-injected, sclera anicteric OROPHARYNX: MMM, no exudates, no oropharyngeal erythema or ulceration NECK: supple, no JVD LYMPH:  no palpable lymphadenopathy in the cervical, axillary or inguinal regions LUNGS: clear to auscultation b/l with normal respiratory effort HEART: regular rate & rhythm ABDOMEN:  normoactive bowel sounds , non tender, not distended. Extremity: rt LE  pedal edema PSYCH: alert & oriented x 3 with fluent speech NEURO: no focal motor/sensory deficits   LABORATORY DATA:  I have reviewed the data as listed  . CBC Latest Ref Rng & Units 08/05/2020 08/05/2020 05/21/2019  WBC 4.0 - 10.5 K/uL 5.1 - 5.8  Hemoglobin 12.0 - 15.0 g/dL 8.8(L) - 10.2(L)  Hematocrit 34.0 - 46.6 % 28.4(L) 26.4(L) 31.7(L)  Platelets 150 - 400 K/uL 126(L) - 101(L)   . CBC    Component Value Date/Time   WBC 5.1 08/05/2020 0909   WBC 5.8 05/21/2019 0553   RBC 2.44 (L) 08/05/2020 0909   HGB 8.8 (L) 08/05/2020 0909   HCT 28.4 (L) 08/05/2020 1045   HCT 26.4 (L) 08/05/2020 0909   PLT 126 (L) 08/05/2020 0909   MCV 108.2 (H) 08/05/2020 0909   MCH 36.1 (H) 08/05/2020 0909   MCHC 33.3 08/05/2020 0909   RDW 13.2 08/05/2020 0909   LYMPHSABS 1.7 08/05/2020 0909   MONOABS 0.6 08/05/2020 0909   EOSABS 0.1 08/05/2020 0909   BASOSABS 0.0 08/05/2020 0909    . CMP Latest Ref Rng & Units 08/05/2020 05/21/2019 05/20/2019  Glucose 70 - 99 mg/dL 96 87 103(H)  BUN 6 - 20 mg/dL _0 Creatinine 0.44 - 1.00 mg/dL 0.71 0.68 0.76  Sodium 135 - 145 mmol/L 139 134(L) 135  Potassium 3.5 - 5.1 mmol/L 3.8 4.6 3.8  Chloride 98 - 111 mmol/L 114(H) 107 103  CO2 22 - 32 mmol/L 19(L) 21(L) 22  Calcium 8.9 - 10.3 mg/dL 8.3(L) 8.0(L) 8.7(L)  Total Protein 6.5 - 8.1 g/dL 6.6 6.1(L) 7.8  Total Bilirubin 0.3 -  1.2 mg/dL 0.3 1.4(H) 2.5(H)  Alkaline Phos 38 - 126 U/L 93 110 149(H)  AST 15 - 41 U/L 58(H) 304(H) 857(H)  ALT 0 - 44 U/L 52(H) 250(H) 441(H)   . Lab Results  Component Value Date   IRON 46 08/05/2020   TIBC 254 08/05/2020   IRONPCTSAT 18 (L) 08/05/2020   (Iron and TIBC)  Lab Results  Component Value Date   FERRITIN 82 08/05/2020   09/22/2018 Bone Marrow Report   09/22/2018 Bone Marrow report    09/22/2018 FISH Analysis    09/22/2018 Cytogenetics   RADIOGRAPHIC STUDIES: I have personally reviewed the radiological images as listed and agreed with the findings in the report. No results found.  ASSESSMENT & PLAN:   1) RBC Macrocytosis, likely due to Vitamin B12 deficiency and other potential nutritional deficiencyes caused by Gastric bypass surgery . Also had dyserythropoiesis on BM Bx. Liver disease could also contribute. 2) Iron deficiency- GI blood loss vs poor absorption related to Gastric bypass surgery. 09/17/2018 Occult blood card to lab is "POSITIVE" x2.  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 15% Plasma cells" 4) Jehovah's Witness - does not want a blood transfusion, even under life threatening conditions -Okay with erythropoietin products 5) Abnormal LFTs - ETOH + fatty liver-- LFT improved from last year. -discussed complete ETOH cessation.   PLAN: -Discussed pt labwork today, 08/05/2020; hgb is lower at 8.8. MCV 106 B12 has improved from 139 to 1467 -Continue monthly [place] Vitamin B12 injections  -Recommend pt take Vitamin D supplement for bone health  -Recommend pt begin B-complex vitamin daily  -Myeloma panel  M spike 1.1g/dl down from 1.5g/dl -- not  suggestive of myeloma progression. 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 Might need to consider EPO if all other reversible factors are addressed patient still anemic. Cannot r/o GI losses -counseling to avoid Ibuprofen -setup and f/u with PCP -absolute etoh cessation -counseled on need to maintain compliance with followup appointments. - 6) left thigh pain Ordered and go X ray done today IMPRESSION: 1. Mild left hip and knee osteoarthritis. 2. No acute or destructive bony abnormality. No radiographic evidence of multiple myeloma within the left femur.   7) RT lower extremity swelling US venous ordered today -- showed no evidence of DVT. Could have venous reflux -f/u with PCP   FOLLOW UP: Additional labs today Ultrasound venous bilateral lower extremities today X-ray left femur today Return to clinic with Dr. Irene Limbo with labs in 2 months Patient to call and set up primary care physician     The total time spent in the appt was 40 minutes and more than 50% was on counseling and direct patient cares.  All of the patient's questions were answered with apparent satisfaction. The patient knows to call the clinic with any problems, questions or concerns.    Sullivan Lone MD [place] [facility] Hematology/On[facility] Physician [facility]  (Office):       [phone] (Work cell):  [phone] (Fax):           [phone]  08/04/2020 8:42 PM  I, Reinaldo Raddle, am acting as scribe for Dr. Sullivan Lone, MD.     .I have reviewed the above documentation for accuracy and completeness, and I agree with the above. Brunetta Genera MD

## 2020-08-05 ENCOUNTER — Other Ambulatory Visit: Payer: Self-pay

## 2020-08-05 ENCOUNTER — Inpatient Hospital Stay: Payer: 59

## 2020-08-05 ENCOUNTER — Ambulatory Visit (HOSPITAL_COMMUNITY): Payer: 59

## 2020-08-05 ENCOUNTER — Encounter: Payer: Self-pay | Admitting: Hematology

## 2020-08-05 ENCOUNTER — Inpatient Hospital Stay: Payer: 59 | Attending: Hematology | Admitting: Hematology

## 2020-08-05 VITALS — BP 126/97 | HR 61 | Temp 98.0°F | Resp 17 | Wt 162.5 lb

## 2020-08-05 DIAGNOSIS — E538 Deficiency of other specified B group vitamins: Secondary | ICD-10-CM

## 2020-08-05 DIAGNOSIS — D649 Anemia, unspecified: Secondary | ICD-10-CM | POA: Diagnosis not present

## 2020-08-05 DIAGNOSIS — Z9884 Bariatric surgery status: Secondary | ICD-10-CM | POA: Insufficient documentation

## 2020-08-05 DIAGNOSIS — Z87891 Personal history of nicotine dependence: Secondary | ICD-10-CM | POA: Insufficient documentation

## 2020-08-05 DIAGNOSIS — D892 Hypergammaglobulinemia, unspecified: Secondary | ICD-10-CM | POA: Diagnosis present

## 2020-08-05 DIAGNOSIS — I10 Essential (primary) hypertension: Secondary | ICD-10-CM | POA: Diagnosis not present

## 2020-08-05 DIAGNOSIS — R7989 Other specified abnormal findings of blood chemistry: Secondary | ICD-10-CM | POA: Insufficient documentation

## 2020-08-05 DIAGNOSIS — C9000 Multiple myeloma not having achieved remission: Secondary | ICD-10-CM

## 2020-08-05 DIAGNOSIS — Z79899 Other long term (current) drug therapy: Secondary | ICD-10-CM | POA: Insufficient documentation

## 2020-08-05 DIAGNOSIS — D7589 Other specified diseases of blood and blood-forming organs: Secondary | ICD-10-CM | POA: Insufficient documentation

## 2020-08-05 DIAGNOSIS — M7989 Other specified soft tissue disorders: Secondary | ICD-10-CM | POA: Insufficient documentation

## 2020-08-05 DIAGNOSIS — M79652 Pain in left thigh: Secondary | ICD-10-CM

## 2020-08-05 DIAGNOSIS — D472 Monoclonal gammopathy: Secondary | ICD-10-CM

## 2020-08-05 LAB — CBC WITH DIFFERENTIAL (CANCER CENTER ONLY)
Abs Immature Granulocytes: 0.01 10*3/uL (ref 0.00–0.07)
Basophils Absolute: 0 10*3/uL (ref 0.0–0.1)
Basophils Relative: 0 %
Eosinophils Absolute: 0.1 10*3/uL (ref 0.0–0.5)
Eosinophils Relative: 1 %
HCT: 26.4 % — ABNORMAL LOW (ref 36.0–46.0)
Hemoglobin: 8.8 g/dL — ABNORMAL LOW (ref 12.0–15.0)
Immature Granulocytes: 0 %
Lymphocytes Relative: 33 %
Lymphs Abs: 1.7 10*3/uL (ref 0.7–4.0)
MCH: 36.1 pg — ABNORMAL HIGH (ref 26.0–34.0)
MCHC: 33.3 g/dL (ref 30.0–36.0)
MCV: 108.2 fL — ABNORMAL HIGH (ref 80.0–100.0)
Monocytes Absolute: 0.6 10*3/uL (ref 0.1–1.0)
Monocytes Relative: 12 %
Neutro Abs: 2.7 10*3/uL (ref 1.7–7.7)
Neutrophils Relative %: 54 %
Platelet Count: 126 10*3/uL — ABNORMAL LOW (ref 150–400)
RBC: 2.44 MIL/uL — ABNORMAL LOW (ref 3.87–5.11)
RDW: 13.2 % (ref 11.5–15.5)
WBC Count: 5.1 10*3/uL (ref 4.0–10.5)
nRBC: 0 % (ref 0.0–0.2)

## 2020-08-05 LAB — IRON AND TIBC
Iron: 46 ug/dL (ref 41–142)
Saturation Ratios: 18 % — ABNORMAL LOW (ref 21–57)
TIBC: 254 ug/dL (ref 236–444)
UIBC: 207 ug/dL (ref 120–384)

## 2020-08-05 LAB — CMP (CANCER CENTER ONLY)
ALT: 52 U/L — ABNORMAL HIGH (ref 0–44)
AST: 58 U/L — ABNORMAL HIGH (ref 15–41)
Albumin: 3.2 g/dL — ABNORMAL LOW (ref 3.5–5.0)
Alkaline Phosphatase: 93 U/L (ref 38–126)
Anion gap: 6 (ref 5–15)
BUN: 9 mg/dL (ref 6–20)
CO2: 19 mmol/L — ABNORMAL LOW (ref 22–32)
Calcium: 8.3 mg/dL — ABNORMAL LOW (ref 8.9–10.3)
Chloride: 114 mmol/L — ABNORMAL HIGH (ref 98–111)
Creatinine: 0.71 mg/dL (ref 0.44–1.00)
GFR, Estimated: >60 mL/min (ref >60)
Glucose, Bld: 96 mg/dL (ref 70–99)
Potassium: 3.8 mmol/L (ref 3.5–5.1)
Sodium: 139 mmol/L (ref 135–145)
Total Bilirubin: 0.3 mg/dL (ref 0.3–1.2)
Total Protein: 6.6 g/dL (ref 6.5–8.1)

## 2020-08-05 LAB — FERRITIN: Ferritin: 82 ng/mL (ref 11–307)

## 2020-08-05 LAB — VITAMIN B12: Vitamin B-12: 1467 pg/mL — ABNORMAL HIGH (ref 180–914)

## 2020-08-05 MED ORDER — CYANOCOBALAMIN 1000 MCG/ML IJ SOLN: 1000.0000 ug | INTRAMUSCULAR | 1 refills | Status: DC

## 2020-08-05 MED ORDER — HYDROXYZINE PAMOATE 25 MG PO CAPS: 25.0000 mg | ORAL_CAPSULE | Freq: Every evening | ORAL | 1 refills | Status: DC | PRN

## 2020-08-07 LAB — FOLATE RBC
Folate, Hemolysate: 239 ng/mL
Folate, RBC: 842 ng/mL (ref >498)
Hematocrit: 28.4 % — ABNORMAL LOW (ref 34.0–46.6)

## 2020-08-08 ENCOUNTER — Other Ambulatory Visit: Payer: Self-pay

## 2020-08-08 ENCOUNTER — Ambulatory Visit (HOSPITAL_COMMUNITY)
Admission: RE | Admit: 2020-08-08 | Discharge: 2020-08-08 | Disposition: A | Discharge status: Home / Self Care | Payer: 59 | Source: Ambulatory Visit | Attending: Hematology | Admitting: Hematology

## 2020-08-08 ENCOUNTER — Telehealth: Payer: Self-pay | Admitting: Hematology

## 2020-08-08 DIAGNOSIS — M7989 Other specified soft tissue disorders: Secondary | ICD-10-CM | POA: Insufficient documentation

## 2020-08-08 DIAGNOSIS — M79652 Pain in left thigh: Secondary | ICD-10-CM

## 2020-08-08 NOTE — Telephone Encounter (Signed)
Called pt to sch appt per 8/5 los. Pt declined to sch appt at this time, stating she wanted to wait for her recent lab results before scheduling anything further. She asked to speak to the nurse so I transferred  call to desk nurse. Per pt request, no new appts have been scheduled.

## 2020-08-08 NOTE — Progress Notes (Signed)
Bilateral lower extremity venous duplex completed. Refer to "CV Proc" under chart review to view preliminary results.  08/08/2020 9:23 AM Kelby Aline., MHA, RVT, RDCS, RDMS

## 2020-08-09 LAB — MULTIPLE MYELOMA PANEL, SERUM
Albumin SerPl Elph-Mcnc: 3.4 g/dL (ref 2.9–4.4)
Albumin/Glob SerPl: 1.1 (ref 0.7–1.7)
Alpha 1: 0.2 g/dL (ref 0.0–0.4)
Alpha2 Glob SerPl Elph-Mcnc: 0.6 g/dL (ref 0.4–1.0)
B-Globulin SerPl Elph-Mcnc: 0.7 g/dL (ref 0.7–1.3)
Gamma Glob SerPl Elph-Mcnc: 1.8 g/dL (ref 0.4–1.8)
Globulin, Total: 3.3 g/dL (ref 2.2–3.9)
IgA: 37 mg/dL — ABNORMAL LOW (ref 87–352)
IgG (Immunoglobin G), Serum: 1828 mg/dL — ABNORMAL HIGH (ref 586–1602)
IgM (Immunoglobulin M), Srm: 396 mg/dL — ABNORMAL HIGH (ref 26–217)
M Protein SerPl Elph-Mcnc: 1.1 g/dL — ABNORMAL HIGH
Total Protein ELP: 6.7 g/dL (ref 6.0–8.5)

## 2020-08-11 ENCOUNTER — Encounter: Payer: Self-pay | Admitting: Hematology

## 2020-08-16 NOTE — Progress Notes (Signed)
Contacted Debra Jacobson per Dr Irene Limbo: to let patiet know myeloma labs did not show progression of myeloma. X ray left thigh no fracture or overt acute pathology, US venous - no DVT. She has anemia - B12 levels well replaced-- continue monthly [place] B12, conitnue B complex 1cap daily. Absolute etoh cessation. Start taking OTC ferrous sulfate '325mg'$  po daily. We shall reassess her labs in 41month Setup PCP ASAP.  Debra Jacobson acknowledged and verbalized understanding. Debra Jacobson to call back to set up next appointment.

## 2020-08-19 ENCOUNTER — Other Ambulatory Visit: Payer: Self-pay | Admitting: Hematology

## 2020-08-19 DIAGNOSIS — Z1231 Encounter for screening mammogram for malignant neoplasm of breast: Secondary | ICD-10-CM

## 2020-08-20 ENCOUNTER — Ambulatory Visit
Admission: RE | Admit: 2020-08-20 | Discharge: 2020-08-20 | Disposition: A | Discharge status: Home / Self Care | Payer: 59 | Source: Ambulatory Visit | Attending: Hematology | Admitting: Hematology

## 2020-08-20 ENCOUNTER — Other Ambulatory Visit: Payer: Self-pay

## 2020-08-20 DIAGNOSIS — Z1231 Encounter for screening mammogram for malignant neoplasm of breast: Secondary | ICD-10-CM

## 2020-08-22 ENCOUNTER — Other Ambulatory Visit: Payer: Self-pay | Admitting: Hematology

## 2020-11-16 ENCOUNTER — Other Ambulatory Visit: Payer: Self-pay | Admitting: Hematology

## 2020-12-29 ENCOUNTER — Encounter: Payer: Self-pay | Admitting: Hematology

## 2021-01-11 ENCOUNTER — Telehealth: Payer: Self-pay | Admitting: Hematology

## 2021-01-11 ENCOUNTER — Encounter: Payer: Self-pay | Admitting: Hematology

## 2021-01-11 NOTE — Telephone Encounter (Signed)
Sch per 1/10 inbasket, pt aware °

## 2021-01-13 ENCOUNTER — Other Ambulatory Visit: Payer: Self-pay

## 2021-01-13 DIAGNOSIS — D472 Monoclonal gammopathy: Secondary | ICD-10-CM

## 2021-01-16 ENCOUNTER — Other Ambulatory Visit: Payer: Self-pay

## 2021-01-16 ENCOUNTER — Ambulatory Visit: Payer: Self-pay | Admitting: Hematology

## 2021-01-18 ENCOUNTER — Telehealth: Payer: Self-pay | Admitting: Hematology

## 2021-01-18 NOTE — Telephone Encounter (Signed)
Patient called to find out more about an appointment with MD. Patient is aware

## 2021-01-31 ENCOUNTER — Encounter (HOSPITAL_COMMUNITY): Payer: Self-pay

## 2021-01-31 ENCOUNTER — Encounter: Payer: Self-pay | Admitting: Hematology

## 2021-01-31 ENCOUNTER — Emergency Department (HOSPITAL_COMMUNITY): Payer: 59

## 2021-01-31 ENCOUNTER — Emergency Department (HOSPITAL_COMMUNITY)
Admission: EM | Admit: 2021-01-31 | Discharge: 2021-01-31 | Disposition: A | Discharge status: Home / Self Care | Payer: 59 | Attending: Emergency Medicine | Admitting: Emergency Medicine

## 2021-01-31 DIAGNOSIS — R600 Localized edema: Secondary | ICD-10-CM | POA: Diagnosis not present

## 2021-01-31 DIAGNOSIS — D649 Anemia, unspecified: Secondary | ICD-10-CM | POA: Insufficient documentation

## 2021-01-31 DIAGNOSIS — R410 Disorientation, unspecified: Secondary | ICD-10-CM | POA: Diagnosis not present

## 2021-01-31 DIAGNOSIS — F1029 Alcohol dependence with unspecified alcohol-induced disorder: Secondary | ICD-10-CM | POA: Insufficient documentation

## 2021-01-31 DIAGNOSIS — F1490 Cocaine use, unspecified, uncomplicated: Secondary | ICD-10-CM | POA: Insufficient documentation

## 2021-01-31 DIAGNOSIS — R4182 Altered mental status, unspecified: Secondary | ICD-10-CM | POA: Diagnosis not present

## 2021-01-31 DIAGNOSIS — Y905 Blood alcohol level of 100-119 mg/100 ml: Secondary | ICD-10-CM | POA: Insufficient documentation

## 2021-01-31 DIAGNOSIS — R69 Illness, unspecified: Secondary | ICD-10-CM | POA: Diagnosis not present

## 2021-01-31 LAB — COMPREHENSIVE METABOLIC PANEL
ALT: 109 U/L — ABNORMAL HIGH (ref 0–44)
AST: 99 U/L — ABNORMAL HIGH (ref 15–41)
Albumin: 3.1 g/dL — ABNORMAL LOW (ref 3.5–5.0)
Alkaline Phosphatase: 76 U/L (ref 38–126)
Anion gap: 5 (ref 5–15)
BUN: 10 mg/dL (ref 8–23)
CO2: 21 mmol/L — ABNORMAL LOW (ref 22–32)
Calcium: 8.2 mg/dL — ABNORMAL LOW (ref 8.9–10.3)
Chloride: 112 mmol/L — ABNORMAL HIGH (ref 98–111)
Creatinine, Ser: 0.93 mg/dL (ref 0.44–1.00)
GFR, Estimated: >60 mL/min (ref >60)
Glucose, Bld: 88 mg/dL (ref 70–99)
Potassium: 4.4 mmol/L (ref 3.5–5.1)
Sodium: 138 mmol/L (ref 135–145)
Total Bilirubin: 0.3 mg/dL (ref 0.3–1.2)
Total Protein: 6.7 g/dL (ref 6.5–8.1)

## 2021-01-31 LAB — CBC
HCT: 27.1 % — ABNORMAL LOW (ref 36.0–46.0)
Hemoglobin: 8.7 g/dL — ABNORMAL LOW (ref 12.0–15.0)
MCH: 36.4 pg — ABNORMAL HIGH (ref 26.0–34.0)
MCHC: 32.1 g/dL (ref 30.0–36.0)
MCV: 113.4 fL — ABNORMAL HIGH (ref 80.0–100.0)
Platelets: 104 10*3/uL — ABNORMAL LOW (ref 150–400)
RBC: 2.39 MIL/uL — ABNORMAL LOW (ref 3.87–5.11)
RDW: 13.5 % (ref 11.5–15.5)
WBC: 5.2 10*3/uL (ref 4.0–10.5)
nRBC: 0 % (ref 0.0–0.2)

## 2021-01-31 LAB — MAGNESIUM: Magnesium: 2.1 mg/dL (ref 1.7–2.4)

## 2021-01-31 LAB — ETHANOL: Alcohol, Ethyl (B): 104 mg/dL — ABNORMAL HIGH (ref <10)

## 2021-01-31 LAB — AMMONIA: Ammonia: 25 umol/L (ref 9–35)

## 2021-01-31 MED ORDER — CHLORDIAZEPOXIDE HCL 25 MG PO CAPS: ORAL_CAPSULE | ORAL | 0 refills | Status: DC

## 2021-01-31 MED ORDER — THIAMINE HCL 100 MG PO TABS: 100.0000 mg | ORAL_TABLET | Freq: Every day | ORAL | 1 refills | Status: DC

## 2021-01-31 MED ORDER — FOLIC ACID 1 MG PO TABS: 1.0000 mg | ORAL_TABLET | Freq: Every day | ORAL | 1 refills | Status: DC

## 2021-01-31 MED ORDER — FOLIC ACID 1 MG PO TABS
1.0000 mg | ORAL_TABLET | Freq: Every day | ORAL | Status: DC
  Administered 2021-01-31: 1 mg via ORAL
  Filled 2021-01-31: qty 1

## 2021-01-31 MED ORDER — THIAMINE HCL 100 MG PO TABS
100.0000 mg | ORAL_TABLET | Freq: Every day | ORAL | Status: DC
  Administered 2021-01-31: 100 mg via ORAL
  Filled 2021-01-31: qty 1

## 2021-01-31 NOTE — ED Provider Triage Note (Addendum)
Emergency Medicine Provider Triage Evaluation Note  Debra Jacobson , a 61 y.o. female  was evaluated in triage.  Pt complains of confusion for the past 5 months. She reports she sat in her car and forgot how to operate it. She reports that sometimes she forgets how to turn on the sink in her bathroom. Missed her recent appointment with her oncologist because she forgot how to work her car. Reports non-mechanical falls last week. She denies any LOC but reports increased confusion and depression since then. Reports daily EtOH use and occasional cocaine use.   H/o Smoldering myeloma, alcohol induced pancreatitis  Review of Systems  Positive: Confusion, bilateral leg pain/weakness   Negative: Chest pain, SOB, abdominal pain   Physical Exam  BP (!) 125/98 (BP Location: Right Arm)    Pulse 99    Temp 98.6 F (37 C) (Oral)    Resp 16    LMP 05/01/2012    SpO2 100%  Gen:   Awake, no distress   Resp:  Normal effort  MSK:   Moves extremities without difficulty  Other:  Normal speech with normal response to questions. Cranial nerves II-XII intact. Moving all extremities spontaneously.  Medical Decision Making  Medically screening exam initiated at 1:32 PM.  Appropriate orders placed.  SOLANA COGGIN was informed that the remainder of the evaluation will be completed by another provider, this initial triage assessment does not replace that evaluation, and the importance of remaining in the ED until their evaluation is complete.  Basic labs ordered. CT ordered   Sherrell Puller, PA-C 01/31/21 [place], [place] 01/31/21 1341

## 2021-01-31 NOTE — ED Triage Notes (Signed)
Pt presents with c/o confusion/altered mental status. Pt reports that she has had multiple falls over the last week and has been having difficulty remembering how to do basic tasks. Pt also c/o bilateral leg pain, no injury.

## 2021-01-31 NOTE — ED Provider Notes (Signed)
[place] DEPT Provider Note   CSN: [id number] Arrival date & time: 01/31/21  1311     History Chief Complaint  Patient presents with   Altered Mental Status    Debra Jacobson is a 61 y.o. female with h/o MM, alcohol use disorder, macrocytic anemia, IDA, chronic swelling of the bilateral lower extremities presents to the ED for evaluation of confusion for the past 5 months.  Additionally, she reports multiple falls.  She reports that she has been forgetting into do basic tasks.  She reports that she was going to a doctor's appointment when she sat in her car and forgot how to turn on or operate the windshield wipers.  She reports that she was standing in her bathroom sink and forgot how to turn the water on.  She denies any blurry vision, nausea, abdominal pain, chest pain, shortness of breath.  She denies any weakness or fatigue.  Additionally, she mentions she is having some bilateral leg swelling although this is been going on for "years and years".  The patient reports she is a daily alcohol drinker with occasional cocaine use.  She reports she drinks between 2 to 3 40 ounces of beer along with multiple "bootleggers" at least.  The patient is currently a travel nurse.  She reports she has been regularly this for 30 years.   Altered Mental Status Presenting symptoms: confusion   Associated symptoms: no hallucinations, no headaches, no light-headedness and no weakness       Home Medications Prior to Admission medications   Medication Sig Start Date End Date Taking? Authorizing Provider  chlordiazePOXIDE (LIBRIUM) 25 MG capsule 50mg  PO TID x 1D, then 25mg  PO BID X 1D, then 25mg  PO QD X 1D 01/31/21  Yes Sherrell Puller, PA-C  folic acid (FOLVITE) 1 MG tablet Take 1 tablet (1 mg total) by mouth daily. 01/31/21  Yes Sherrell Puller, PA-C  thiamine 100 MG tablet Take 1 tablet (100 mg total) by mouth daily. 01/31/21  Yes Sherrell Puller, PA-C  cyanocobalamin (,VITAMIN  B-12,) 1000 MCG/ML injection INJECT 1 ML UNDER THE SKIN WEEKLY FOR 4 WEEKS, THEN 1ML UNDER THE SKIN EVERY 30 DAYS 11/16/20   Brunetta Genera, MD  DUPIXENT 300 MG/2ML prefilled syringe Inject 300 mg into the skin every 14 (fourteen) days. 04/09/19   [provider]  hydrOXYzine (VISTARIL) 25 MG capsule Take 1-2 capsules (25-50 mg total) by mouth at bedtime as needed. 08/05/20   Brunetta Genera, MD  triamcinolone cream (KENALOG) 0.1 % Apply 1 application topically 4 (four) times daily as needed. Patient not taking: Reported on 03/08/2017 05/22/16   Clayton Bibles, PA-C  Vitamin D, Ergocalciferol, (DRISDOL) 1.25 MG (50000 UNIT) CAPS capsule TAKE ONE CAPSULE BY MOUTH WEEKLY 08/22/20   Brunetta Genera, MD      Allergies    Patient has no known allergies.    Review of Systems   Review of Systems  Neurological:  Negative for dizziness, weakness, light-headedness and headaches.  Psychiatric/Behavioral:  Positive for confusion. Negative for hallucinations and suicidal ideas.   All other systems reviewed and are negative.  Physical Exam Updated Vital Signs BP 117/86    Pulse 68    Temp 98.2 F (36.8 C) (Oral)    Resp 16    LMP 05/01/2012    SpO2 98%  Physical Exam Vitals and nursing note reviewed.  Constitutional:      General: She is not in acute distress.    Appearance: Normal  appearance. She is not toxic-appearing.  HENT:     Head: Normocephalic and atraumatic.     Mouth/Throat:     Mouth: Mucous membranes are moist.  Eyes:     General: No scleral icterus.    Extraocular Movements: Extraocular movements intact.     Pupils: Pupils are equal, round, and reactive to light.  Cardiovascular:     Rate and Rhythm: Normal rate and regular rhythm.     Heart sounds: No murmur heard. Pulmonary:     Effort: Pulmonary effort is normal. No respiratory distress.     Breath sounds: Normal breath sounds.  Abdominal:     General: Abdomen is flat. Bowel sounds are normal.     Palpations:  Abdomen is soft.     Tenderness: There is no abdominal tenderness. There is no guarding or rebound.  Musculoskeletal:        General: No tenderness or deformity.     Cervical back: Normal range of motion. No tenderness.     Right lower leg: Edema present.     Left lower leg: Edema present.  Skin:    General: Skin is warm and dry.  Neurological:     General: No focal deficit present.     Mental Status: She is alert and oriented to person, place, and time. Mental status is at baseline.     GCS: GCS eye subscore is 4. GCS verbal subscore is 5. GCS motor subscore is 6.     Cranial Nerves: Cranial nerves 2-12 are intact. No cranial nerve deficit, dysarthria or facial asymmetry.     Sensory: Sensation is intact. No sensory deficit.     Motor: No weakness, tremor or pronator drift.     Coordination: Coordination is intact. Romberg sign negative. Coordination normal. Finger-Nose-Finger Test and Heel to [facility] Test normal.     Gait: Gait is intact. Gait normal.     Comments: Patient answering questions appropriately with appropriate speech.  No dysarthria.  Oriented x4.  She was able to keep up with her labs and imaging that were ordered in triage and was able to recall the results and ask questions on them hours later.    ED Results / Procedures / Treatments   Labs (all labs ordered are listed, but only abnormal results are displayed) Labs Reviewed  COMPREHENSIVE METABOLIC PANEL - Abnormal; Notable for the following components:      Result Value   Chloride 112 (*)    CO2 21 (*)    Calcium 8.2 (*)    Albumin 3.1 (*)    AST 99 (*)    ALT 109 (*)    All other components within normal limits  CBC - Abnormal; Notable for the following components:   RBC 2.39 (*)    Hemoglobin 8.7 (*)    HCT 27.1 (*)    MCV 113.4 (*)    MCH 36.4 (*)    Platelets 104 (*)    All other components within normal limits  ETHANOL - Abnormal; Notable for the following components:   Alcohol, Ethyl (B) 104 (*)     All other components within normal limits  AMMONIA  MAGNESIUM    EKG EKG Interpretation  Date/Time:  Tuesday January 31 2021 17:32:50 EST Ventricular Rate:  64 PR Interval:  155 QRS Duration: 82 QT Interval:  455 QTC Calculation: 470 R Axis:   57 Text Interpretation: Sinus rhythm No significant change since last tracing Confirmed by Blanchie Dessert [phone]) on 01/31/2021 5:34:18 PM  Radiology CT Head Wo Contrast  Result Date: 01/31/2021 CLINICAL DATA:  Mental status change.  Multiple falls. EXAM: CT HEAD WITHOUT CONTRAST TECHNIQUE: Contiguous axial images were obtained from the base of the skull through the vertex without intravenous contrast. RADIATION DOSE REDUCTION: This exam was performed according to the departmental dose-optimization program which includes automated exposure control, adjustment of the mA and/or kV according to patient size and/or use of iterative reconstruction technique. COMPARISON:  03/09/2017 FINDINGS: Brain: No evidence of acute infarction, hemorrhage, hydrocephalus, extra-axial collection or mass lesion/mass effect. Vascular: Negative for hyperdense vessel Skull: Negative Sinuses/Orbits: Negative Other: None IMPRESSION: No acute intracranial abnormality. Electronically Signed   By: Franchot Gallo M.D.   On: 01/31/2021 14:38    Procedures Procedures   Medications Ordered in ED Medications - No data to display  ED Course/ Medical Decision Making/ A&P                           Medical Decision Making Amount and/or Complexity of Data Reviewed Labs: ordered. Radiology: ordered.  Risk OTC drugs. Prescription drug management.   61 year old female presents emergency department for evaluation of confusion and multiple falls for the past 5 months.  Differential diagnosis includes but is not limited to alcohol intoxication, electrolyte imbalance, stroke, encephalopathy, early onset dementia.  Vital signs are normal.  Normotensive, afebrile, normal heart rate.   Patient satting 98% on room air.  Physical exam shows some bilateral edema to the lower extremities although patient reports this is chronic for her.  Patient does not appear confused she is alert and oriented with normal speech and asking and answering appropriate questions.  She is able to recall results given to her hours later and ask appropriate questions about them.  Normal gait.  Labs and imaging ordered.  On chart review, the patient has a history of smoldering myeloma and has a longstanding history of alcohol abuse.  She has macrocytic anemia known to her from possible vitamin B12 deficiency and/or alcoholism.  Her current myeloma numbers have been downtrending from her recent oncology note.  She missed her appointment in January due to the above scenario given in HPI.  She has another appointment scheduled in less than 2 weeks.  I independently interpreted and reviewed the patient's labs.  CMP shows elevated chloride at 112 and slightly decreased bicarb of 21.  Decreased calcium at 8.2.  Decreased albumin at 3.1.  Elevated AST and ALT from previous.  These MRs have doubled.  CBC shows anemia at hemoglobin of 8.7 although this is consistent with patient's baseline.  Mildly decreased platelets in comparison to her previous that 104.  Patient still has macrocytic anemia.  She reports she has been getting infusions of iron and her hemoglobin is usually lower.  Normal ammonium and magnesium.  Her ethanol is 104 which is over the legal limit.  I independently reviewed and interpreted the patient's imaging and agree with radiologist findings of acute intracranial process of the head.  EKG shows sinus rhythm.  Given the patient's longstanding alcohol use, she was given thiamine and folic acid while here.  The patient was concerned that her confusion was due to her smoldering myeloma does not think is from her alcohol use.  I discussed with her that from her previous oncology appointment that her  myeloma labs actually show a decrease in comparison to her previous ones and that this confusion is most likely due to her alcohol use, although she  should follow-up with her oncologist for their way in and possible MRI if they see fit.  Had a long discussion with the patient on how her confusion is likely due to her longstanding alcohol use.  The patient reports she would like to cut back.  I discussed with her that I can give her Librium to help with the withdrawal from alcohol although she will need to refrain from drinking completely.  Patient is in agreements with this.  Additionally, I will prescribe her thiamine and folic acid to take at home.  30-day supply with 1 refill added.  I advised patient that she does not need to drive when he has been drinking as it provides a danger for self and for others.  I advised her to follow-up with her cancer doctor as she does not have a PCP for further evaluation of her confusion.  Information for [facility] and wellness given as well.  Strict return precautions discussed.  Patient agrees with plan.  Patient is stable and being discharged home in good condition.  I discussed this case with my attending physician who cosigned this note including patient's presenting symptoms, physical exam, and planned diagnostics and interventions. Attending physician stated agreement with plan or made changes to plan which were implemented.   Final Clinical Impression(s) / ED Diagnoses Final diagnoses:  Confusion  Alcohol dependence with unspecified alcohol-induced disorder [facility])    Rx / DC Orders ED Discharge Orders          Ordered    thiamine 100 MG tablet  Daily        63/81/77 1165    folic acid (FOLVITE) 1 MG tablet  Daily        01/31/21 1948    chlordiazePOXIDE (LIBRIUM) 25 MG capsule        01/31/21 1948              Sherrell Puller, PA-C 02/01/21 1823    Blanchie Dessert, MD 02/02/21 1705

## 2021-01-31 NOTE — Discharge Instructions (Addendum)
You were seen here today for evaluation of your confusion on going over the past 5 months. Your ethanol level is above the legal limit and you should not be driving home. Your lab work is unchanged from previous. Your CT of your head showed no abnormality. Your confusion is likely due to your alcohol use. I have prescribed you Librium that should help with withdrawing from alcohol.  Please do not drink while on this medication.  You will take this medication for the next 3 days.  Please take as prescribed.  Additionally I prescribed you thiamine and folic acid to take as these elements are usually lower in people with chronic alcohol use.  I would recommend that you follow-up with your multiple myeloma provider for reevaluation of your concerns.  Resources for outpatient residential substance use disorder included in this discharge paperwork.  If you have any concern, new or worsening symptoms, please return to the nearest department for evaluation.

## 2021-02-13 ENCOUNTER — Other Ambulatory Visit: Payer: Self-pay

## 2021-02-13 ENCOUNTER — Inpatient Hospital Stay (HOSPITAL_BASED_OUTPATIENT_CLINIC_OR_DEPARTMENT_OTHER): Payer: PRIVATE HEALTH INSURANCE | Admitting: Hematology

## 2021-02-13 ENCOUNTER — Inpatient Hospital Stay: Payer: Medicaid Other | Attending: Hematology

## 2021-02-13 VITALS — BP 113/76 | HR 62 | Temp 97.8°F | Resp 18 | Ht 67.0 in | Wt 145.3 lb

## 2021-02-13 DIAGNOSIS — D7589 Other specified diseases of blood and blood-forming organs: Secondary | ICD-10-CM | POA: Diagnosis not present

## 2021-02-13 DIAGNOSIS — D472 Monoclonal gammopathy: Secondary | ICD-10-CM

## 2021-02-13 DIAGNOSIS — D508 Other iron deficiency anemias: Secondary | ICD-10-CM | POA: Diagnosis not present

## 2021-02-13 DIAGNOSIS — Z9884 Bariatric surgery status: Secondary | ICD-10-CM | POA: Diagnosis not present

## 2021-02-13 DIAGNOSIS — E538 Deficiency of other specified B group vitamins: Secondary | ICD-10-CM

## 2021-02-13 DIAGNOSIS — D509 Iron deficiency anemia, unspecified: Secondary | ICD-10-CM | POA: Diagnosis not present

## 2021-02-13 LAB — CMP (CANCER CENTER ONLY)
ALT: 88 U/L — ABNORMAL HIGH (ref 0–44)
AST: 139 U/L — ABNORMAL HIGH (ref 15–41)
Albumin: 3.1 g/dL — ABNORMAL LOW (ref 3.5–5.0)
Alkaline Phosphatase: 72 U/L (ref 38–126)
Anion gap: 3 — ABNORMAL LOW (ref 5–15)
BUN: 7 mg/dL — ABNORMAL LOW (ref 8–23)
CO2: 24 mmol/L (ref 22–32)
Calcium: 8.4 mg/dL — ABNORMAL LOW (ref 8.9–10.3)
Chloride: 115 mmol/L — ABNORMAL HIGH (ref 98–111)
Creatinine: 1.05 mg/dL — ABNORMAL HIGH (ref 0.44–1.00)
GFR, Estimated: >60 mL/min (ref >60)
Glucose, Bld: 93 mg/dL (ref 70–99)
Potassium: 5 mmol/L (ref 3.5–5.1)
Sodium: 142 mmol/L (ref 135–145)
Total Bilirubin: 0.4 mg/dL (ref 0.3–1.2)
Total Protein: 6.3 g/dL — ABNORMAL LOW (ref 6.5–8.1)

## 2021-02-13 LAB — CBC WITH DIFFERENTIAL (CANCER CENTER ONLY)
Abs Immature Granulocytes: 0.02 10*3/uL (ref 0.00–0.07)
Basophils Absolute: 0 10*3/uL (ref 0.0–0.1)
Basophils Relative: 0 %
Eosinophils Absolute: 0 10*3/uL (ref 0.0–0.5)
Eosinophils Relative: 0 %
HCT: 25.6 % — ABNORMAL LOW (ref 36.0–46.0)
Hemoglobin: 8.4 g/dL — ABNORMAL LOW (ref 12.0–15.0)
Immature Granulocytes: 0 %
Lymphocytes Relative: 24 %
Lymphs Abs: 1.2 10*3/uL (ref 0.7–4.0)
MCH: 36.7 pg — ABNORMAL HIGH (ref 26.0–34.0)
MCHC: 32.8 g/dL (ref 30.0–36.0)
MCV: 111.8 fL — ABNORMAL HIGH (ref 80.0–100.0)
Monocytes Absolute: 0.4 10*3/uL (ref 0.1–1.0)
Monocytes Relative: 8 %
Neutro Abs: 3.3 10*3/uL (ref 1.7–7.7)
Neutrophils Relative %: 68 %
Platelet Count: 135 10*3/uL — ABNORMAL LOW (ref 150–400)
RBC: 2.29 MIL/uL — ABNORMAL LOW (ref 3.87–5.11)
RDW: 14.3 % (ref 11.5–15.5)
WBC Count: 4.9 10*3/uL (ref 4.0–10.5)
nRBC: 0 % (ref 0.0–0.2)

## 2021-02-13 LAB — IRON AND IRON BINDING CAPACITY (CC-WL,HP ONLY)
Iron: 130 ug/dL (ref 28–170)
Saturation Ratios: 58 % — ABNORMAL HIGH (ref 10.4–31.8)
TIBC: 224 ug/dL — ABNORMAL LOW (ref 250–450)
UIBC: 94 ug/dL — ABNORMAL LOW (ref 148–442)

## 2021-02-13 LAB — FERRITIN: Ferritin: 243 ng/mL (ref 11–307)

## 2021-02-13 LAB — VITAMIN B12: Vitamin B-12: 1100 pg/mL — ABNORMAL HIGH (ref 180–914)

## 2021-02-14 LAB — FOLATE RBC
Folate, Hemolysate: 287 ng/mL
Folate, RBC: 1083 ng/mL (ref >498)
Hematocrit: 26.5 % — ABNORMAL LOW (ref 34.0–46.6)

## 2021-02-15 LAB — MULTIPLE MYELOMA PANEL, SERUM
Albumin SerPl Elph-Mcnc: 3.1 g/dL (ref 2.9–4.4)
Albumin/Glob SerPl: 1.2 (ref 0.7–1.7)
Alpha 1: 0.2 g/dL (ref 0.0–0.4)
Alpha2 Glob SerPl Elph-Mcnc: 0.4 g/dL (ref 0.4–1.0)
B-Globulin SerPl Elph-Mcnc: 0.5 g/dL — ABNORMAL LOW (ref 0.7–1.3)
Gamma Glob SerPl Elph-Mcnc: 1.6 g/dL (ref 0.4–1.8)
Globulin, Total: 2.8 g/dL (ref 2.2–3.9)
IgA: 53 mg/dL — ABNORMAL LOW (ref 87–352)
IgG (Immunoglobin G), Serum: 1613 mg/dL — ABNORMAL HIGH (ref 586–1602)
IgM (Immunoglobulin M), Srm: 384 mg/dL — ABNORMAL HIGH (ref 26–217)
M Protein SerPl Elph-Mcnc: 0.8 g/dL — ABNORMAL HIGH
Total Protein ELP: 5.9 g/dL — ABNORMAL LOW (ref 6.0–8.5)

## 2021-02-19 ENCOUNTER — Encounter: Payer: Self-pay | Admitting: Hematology

## 2021-02-19 NOTE — Progress Notes (Addendum)
HEMATOLOGY/ONCOLOGY CLINIC NOTE  Date of Service: .02/13/2021   Patient Care Team: Pcp, No as PCP - General  CHIEF COMPLAINTS/PURPOSE OF CONSULTATION:  Follow-up for smoldering myeloma Multifactorial anemia  HISTORY OF PRESENTING ILLNESS:   Debra Jacobson is a wonderful 61 y.o. female who has been referred to [place] by Dr Belva Agee for evaluation and management of anemia. The pt reports that she is doing well overall.  The pt reports that she was first given the diagnoses of anemia about 10 years ago. Pt is currently taking 1 iron pill every other day. Pt had a gastric bypass surgery in 2014. She initially weighed about 400 lbs and is currently 164 lbs. She has been experiencing a severe lack of appetite and weight loss. When she does attempt to eat she can only take a couple of bites and then has a bowel movement soon after that. She also has abdominal pain after she eats. Pt has seasonal allergies and eczema, for which she has been taking Dupixent for 3 months. Pt has never had a skin biopsy to confirm her eczema diagnosis. It was dormant for about 10 years and came back 2 years ago after an interaction with a cat and has persisted. Pt also takes Vistaril to help with the itch. Pt has never had an allergy test or immune therapy testing. Pt took steroid shots for her carpal tunnel. There were pre-cancerous cells found in pt's pap smear and her Gynecologist is planning on doing a resection. Pt was experiencing black stools a couple of months ago, that were not painful and did not speak with her PCP about it. She had a colonoscopy in 2014 and has never had an endoscopy. Pt believes her ankle swelling is from work and wears compression socks which helps.   Most recent lab results (08/12/2018) of CBC is as follows: WBC at 3.8K, RBC at 2.49, Hgb at 7.8, HCT at 24.3, MCV at 98, MCH at 31.3, MCHC at 32.1, RDW at 15.9, Platelets at 182K.  08/12/2018 Transferrin is 286 08/12/2018 Ferritin is 30   08/12/2018 Iron and TIBC shows Iron Bind, Cap, (TIBC) at 349, UIBC at 303, Iron at 46, Iron Sat at 13.   On review of systems, pt reports pain in her left knee, weight loss, fatigue, enlarged cervical lymph nodes, black/bloody stools, abdominal pain and denies vaginal bleeds, nose bleeds gum bleeds, chest pain, SOB, fevers, chills and any other symptoms.   On PMHx the pt reports gastric bypass surgery, two cesarean sections, tubal ligation.  On Social Hx the pt reports that she has quit smoking, no alcohol use.   INTERVAL HISTORY:   Debra Jacobson is here after several missed appointments for follow-up of her smoldering myeloma and anemia. She notes she has had episodes of confusion and was last in the emergency room on 01/31/2021.  Her confusion was thought to be possible hepatic encephalopathy from alcohol abuse. Patient does endorse continued heavy alcohol use. She was recommended complete alcohol abstinence and also offered help with referral needing rehabilitation facility which she wanted to hold off currently. 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 She was also noted that she should talk to her primary care physician and if she is having continued confusion, continue current use she should not not be working as a Engineer, civil (consulting).  Labs done today show hemoglobin of 8.4 with an MCV of 111.8,  WBC count of 4.9k platelets of 125k CMP creatinine of 1.05 AST elevated at 139 ALT 88 (has previously had levels as high as 857 and 441 respectively) likely due to alcohol abuse. Ferritin 243 with an iron saturation of 54% Folic acid within normal limits B12 1100 (was previously as low as 139) Myeloma panel shows an M spike of 0.8 g/dL which is down from her previous high of 1.5 g/dL.  MEDICAL HISTORY:  Past Medical History:  Diagnosis Date   Allergy    all rhinitis   Dependent edema    Dermatitis    GERD  (gastroesophageal reflux disease)    Hypertension     SURGICAL HISTORY: Past Surgical History:  Procedure Laterality Date   CESAREAN SECTION     x2   TUBAL LIGATION      SOCIAL HISTORY: Social History   Socioeconomic History   Marital status: Married    Spouse name: Not on file   Number of children: Not on file   Years of education: Not on file   Highest education level: Not on file  Occupational History   Not on file  Tobacco Use   Smoking status: Former    Types: Cigarettes    Quit date: 08/29/1991    Years since quitting: 29.5   Smokeless tobacco: Never  Vaping Use   Vaping Use: Never used  Substance and Sexual Activity   Alcohol use: Yes    Comment: Patient drinks 2 40ox beers multiple days a week   Drug use: No   Sexual activity: Not on file  Other Topics Concern   Not on file  Social History Narrative   Not on file   Social Determinants of Health   Financial Resource Strain: Not on file  Food Insecurity: Not on file  Transportation Needs: Not on file  Physical Activity: Not on file  Stress: Not on file  Social Connections: Not on file  Intimate Partner Violence: Not on file    FAMILY HISTORY: Family History  Problem Relation Age of Onset   Hyperlipidemia Mother     ALLERGIES:  has No Known Allergies.  MEDICATIONS:  Current Outpatient Medications  Medication Sig Dispense Refill   cyanocobalamin (,VITAMIN B-12,) 1000 MCG/ML injection INJECT 1 ML UNDER THE SKIN WEEKLY FOR 4 WEEKS, THEN 1ML UNDER THE SKIN EVERY 30 DAYS 6 mL 1   DUPIXENT 300 MG/2ML prefilled syringe Inject 300 mg into the skin every 14 (fourteen) days.     hydrOXYzine (VISTARIL) 25 MG capsule Take 1-2 capsules (25-50 mg total) by mouth at bedtime as needed. 30 capsule 1   Vitamin D, Ergocalciferol, (DRISDOL) 1.25 MG (50000 UNIT) CAPS capsule TAKE ONE CAPSULE BY MOUTH WEEKLY 12 capsule 4   chlordiazePOXIDE (LIBRIUM) 25 MG capsule $RemoveBe'50mg'EswFFuHoA$  PO TID x 1D, then $Remove'25mg'ubuLOTf$  PO BID X 1D, then $Remove'25mg'iGcdUUW$  PO  QD X 1D (Patient not taking: Reported on 02/13/2021) 9 capsule 0   folic acid (FOLVITE) 1 MG tablet Take 1 tablet (1 mg total) by mouth daily. (Patient not taking: Reported on 02/13/2021) 30 tablet 1   thiamine 100 MG tablet Take 1 tablet (100 mg total) by mouth daily. (Patient not taking: Reported on 02/13/2021) 30 tablet 1   triamcinolone cream (KENALOG) 0.1 % Apply 1 application topically 4 (four) times daily as needed. (Patient not taking: Reported on 03/08/2017) 30 g 0   No current facility-administered medications for this visit.    REVIEW OF SYSTEMS:   10 Point review of Systems was  done is negative except as noted above.  PHYSICAL EXAMINATION: ECOG PERFORMANCE STATUS: 1 - Symptomatic but completely ambulatory  . Vitals:   02/13/21 1325  BP: 113/76  Pulse: 62  Resp: 18  Temp: 97.8 F (36.6 C)  SpO2: 100%    Filed Weights   02/13/21 1325  Weight: 145 lb 4.8 oz (65.9 kg)    .Body mass index is 22.76 kg/m.  NAD GENERAL:alert, in no acute distress and comfortable SKIN: no acute rashes, no significant lesions EYES: conjunctiva are pink and non-injected, sclera anicteric OROPHARYNX: MMM, no exudates, no oropharyngeal erythema or ulceration NECK: supple, no JVD LYMPH:  no palpable lymphadenopathy in the cervical, axillary or inguinal regions LUNGS: clear to auscultation b/l with normal respiratory effort HEART: regular rate & rhythm ABDOMEN:  normoactive bowel sounds , non tender, not distended. Extremity: rt LE pedal edema PSYCH: alert & oriented x 3 with fluent speech NEURO: no focal motor/sensory deficits   LABORATORY DATA:  I have reviewed the data as listed  . CBC Latest Ref Rng & Units 02/13/2021 02/13/2021 01/31/2021  WBC 4.0 - 10.5 K/uL 4.9 - 5.2  Hemoglobin 12.0 - 15.0 g/dL 8.4(L) - 8.7(L)  Hematocrit 34.0 - 46.6 % 26.5(L) 25.6(L) 27.1(L)  Platelets 150 - 400 K/uL 135(L) - 104(L)   . CBC    Component Value Date/Time   WBC 4.9 02/13/2021 1257   WBC 5.2  01/31/2021 1347   RBC 2.29 (L) 02/13/2021 1257   HGB 8.4 (L) 02/13/2021 1257   HCT 26.5 (L) 02/13/2021 1258   HCT 25.6 (L) 02/13/2021 1257   PLT 135 (L) 02/13/2021 1257   MCV 111.8 (H) 02/13/2021 1257   MCH 36.7 (H) 02/13/2021 1257   MCHC 32.8 02/13/2021 1257   RDW 14.3 02/13/2021 1257   LYMPHSABS 1.2 02/13/2021 1257   MONOABS 0.4 02/13/2021 1257   EOSABS 0.0 02/13/2021 1257   BASOSABS 0.0 02/13/2021 1257    . CMP Latest Ref Rng & Units 02/13/2021 01/31/2021 08/05/2020  Glucose 70 - 99 mg/dL 93 88 96  BUN 8 - 23 mg/dL 7(L) 10 9  Creatinine 0.44 - 1.00 mg/dL 1.05(H) 0.93 0.71  Sodium 135 - 145 mmol/L 142 138 139  Potassium 3.5 - 5.1 mmol/L 5.0 4.4 3.8  Chloride 98 - 111 mmol/L 115(H) 112(H) 114(H)  CO2 22 - 32 mmol/L 24 21(L) 19(L)  Calcium 8.9 - 10.3 mg/dL 8.4(L) 8.2(L) 8.3(L)  Total Protein 6.5 - 8.1 g/dL 6.3(L) 6.7 6.6  Total Bilirubin 0.3 - 1.2 mg/dL 0.4 0.3 0.3  Alkaline Phos 38 - 126 U/L 72 76 93  AST 15 - 41 U/L 139(H) 99(H) 58(H)  ALT 0 - 44 U/L 88(H) 109(H) 52(H)   . Lab Results  Component Value Date   IRON 130 02/13/2021   TIBC 224 (L) 02/13/2021   IRONPCTSAT 58 (H) 02/13/2021   (Iron and TIBC)  Lab Results  Component Value Date   FERRITIN 243 02/13/2021   09/22/2018 Bone Marrow Report   09/22/2018 Bone Marrow report    09/22/2018 FISH Analysis    09/22/2018 Cytogenetics   RADIOGRAPHIC STUDIES: I have personally reviewed the radiological images as listed and agreed with the findings in the report. CT Head Wo Contrast  Result Date: 01/31/2021 CLINICAL DATA:  Mental status change.  Multiple falls. EXAM: CT HEAD WITHOUT CONTRAST TECHNIQUE: Contiguous axial images were obtained from the base of the skull through the vertex without intravenous contrast. RADIATION DOSE REDUCTION: This exam was performed according to the departmental  dose-optimization program which includes automated exposure control, adjustment of the mA and/or kV according to patient size  and/or use of iterative reconstruction technique. COMPARISON:  03/09/2017 FINDINGS: Brain: No evidence of acute infarction, hemorrhage, hydrocephalus, extra-axial collection or mass lesion/mass effect. Vascular: Negative for hyperdense vessel Skull: Negative Sinuses/Orbits: Negative Other: None IMPRESSION: No acute intracranial abnormality. Electronically Signed   By: Franchot Gallo M.D.   On: 01/31/2021 14:38    ASSESSMENT & PLAN:   1) RBC Macrocytosis, likely due to Vitamin B12 deficiency and other potential nutritional deficiencyes caused by Gastric bypass surgery . Also had dyserythropoiesis on BM Bx.  Currently active significant alcohol abuse and liver disease are also significant contributing factors. 2) Iron deficiency- GI blood loss vs poor absorption related to Gastric bypass surgery. 09/17/2018 Occult blood card to lab is "POSITIVE" x2.  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  Recent admission with confusion and hepatic encephalopathy. 6) noncompliance with medical follow-up  PLAN: She notes she has had episodes of confusion and was last in the emergency room on 01/31/2021.  Her confusion was thought to be possible hepatic encephalopathy from alcohol abuse. Patient does endorse continued heavy alcohol use. She was recommended complete alcohol abstinence and also offered help with referral needing rehabilitation facility which she wanted to hold off currently. 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 She was also noted that she should talk to her primary care physician and if she is having continued confusion, continue current use she should not not be working as a Marine scientist.  Labs done today show hemoglobin of 8.4 with an MCV of 111.8, WBC count of 4.9k platelets of 125k CMP creatinine of 1.05 AST elevated at 139 ALT 88 (has previously had levels as high as 857 and 441 respectively) likely due to alcohol abuse. Ferritin 243 with an iron saturation of 38% Folic acid within normal limits B12 1100 (was previously as low as 139) Myeloma panel shows an M spike of 0.8 g/dL which is down from her previous high of 1.5 g/dL. -No evidence of progression to multiple myeloma at this time -We discussed that her anemia is primarily driven by her heavy alcohol abuse and alcohol-related liver disease. -B12 levels are stable and she should continue her current B12 replacement at 1000 mcg daily -No clear indication for IV iron at this time -If the patient indulges in alcohol cessation and is still anemic with hemoglobin less than 9 will need to consider erythropoietin  FOLLOW UP: Close follow-up with primary care physician Return to clinic with Dr. Irene Limbo with labs in 2 months  The total time spent in the appt was 40 minutes and more than 50% was on counseling and direct patient cares.  All of the patient's questions were answered with apparent satisfaction. The patient knows to call the clinic with any problems, questions or concerns.    Sullivan Lone MD [place] AAHIVMS [facility] Hematology/Oncology Physician [facility]

## 2021-02-24 NOTE — Addendum Note (Signed)
Addended by: Sullivan Lone on: 02/24/2021 12:14 AM   Modules accepted: Orders

## 2021-03-01 ENCOUNTER — Other Ambulatory Visit: Payer: Self-pay | Admitting: Hematology

## 2021-03-28 ENCOUNTER — Other Ambulatory Visit: Payer: Self-pay | Admitting: Internal Medicine

## 2021-03-30 ENCOUNTER — Encounter: Payer: Self-pay | Admitting: Hematology

## 2021-03-30 ENCOUNTER — Encounter: Payer: Self-pay | Admitting: Nurse Practitioner

## 2021-03-30 LAB — CBC
HCT: 26.6 % — ABNORMAL LOW (ref 35.0–45.0)
Hemoglobin: 9.2 g/dL — ABNORMAL LOW (ref 11.7–15.5)
MCH: 37.1 pg — ABNORMAL HIGH (ref 27.0–33.0)
MCHC: 34.6 g/dL (ref 32.0–36.0)
MCV: 107.3 fL — ABNORMAL HIGH (ref 80.0–100.0)
MPV: 10.5 fL (ref 7.5–12.5)
Platelets: 132 10*3/uL — ABNORMAL LOW (ref 140–400)
RBC: 2.48 10*6/uL — ABNORMAL LOW (ref 3.80–5.10)
RDW: 12.7 % (ref 11.0–15.0)
WBC: 7.3 10*3/uL (ref 3.8–10.8)

## 2021-03-30 LAB — LIPID PANEL
Cholesterol: 164 mg/dL (ref <200)
HDL: 113 mg/dL (ref >=50)
Non-HDL Cholesterol (Calc): 51 mg/dL (calc) (ref <130)
Total CHOL/HDL Ratio: 1.5 (calc) (ref <5.0)
Triglycerides: 527 mg/dL — ABNORMAL HIGH (ref <150)

## 2021-03-30 LAB — COMPLETE METABOLIC PANEL WITH GFR
AG Ratio: 0.9 (calc) — ABNORMAL LOW (ref 1.0–2.5)
ALT: 79 U/L — ABNORMAL HIGH (ref 6–29)
AST: 108 U/L — ABNORMAL HIGH (ref 10–35)
Albumin: 3.3 g/dL — ABNORMAL LOW (ref 3.6–5.1)
Alkaline phosphatase (APISO): 89 U/L (ref 37–153)
BUN: 15 mg/dL (ref 7–25)
CO2: 15 mmol/L — ABNORMAL LOW (ref 20–32)
Calcium: 8.6 mg/dL (ref 8.6–10.4)
Chloride: 117 mmol/L — ABNORMAL HIGH (ref 98–110)
Creat: 0.94 mg/dL (ref 0.50–1.05)
Globulin: 3.5 g/dL (calc) (ref 1.9–3.7)
Glucose, Bld: 85 mg/dL (ref 65–99)
Potassium: 4.6 mmol/L (ref 3.5–5.3)
Sodium: 145 mmol/L (ref 135–146)
Total Bilirubin: 0.4 mg/dL (ref 0.2–1.2)
Total Protein: 6.8 g/dL (ref 6.1–8.1)
eGFR: 69 mL/min/{1.73_m2} (ref >=60)

## 2021-03-30 LAB — TSH: TSH: 1.36 mIU/L (ref 0.40–4.50)

## 2021-03-30 LAB — RHEUMATOID FACTOR: Rheumatoid fact SerPl-aCnc: 16 IU/mL — ABNORMAL HIGH (ref <14)

## 2021-03-30 LAB — TIQ-MISC

## 2021-03-30 LAB — ANA: Anti Nuclear Antibody (ANA): NEGATIVE

## 2021-03-30 LAB — SEDIMENTATION RATE: Sed Rate: 14 mm/h (ref 0–30)

## 2021-03-30 LAB — URIC ACID: Uric Acid, Serum: 9.3 mg/dL — ABNORMAL HIGH (ref 2.5–7.0)

## 2021-03-30 LAB — VITAMIN D 25 HYDROXY (VIT D DEFICIENCY, FRACTURES): Vit D, 25-Hydroxy: 27 ng/mL — ABNORMAL LOW (ref 30–100)

## 2021-04-03 ENCOUNTER — Encounter: Payer: Commercial Managed Care - PPO | Admitting: Radiology

## 2021-04-06 ENCOUNTER — Ambulatory Visit: Payer: 59 | Admitting: Orthopedic Surgery

## 2021-04-06 ENCOUNTER — Encounter: Payer: Self-pay | Admitting: Hematology

## 2021-04-06 ENCOUNTER — Ambulatory Visit (INDEPENDENT_AMBULATORY_CARE_PROVIDER_SITE_OTHER): Payer: 59

## 2021-04-06 ENCOUNTER — Ambulatory Visit: Payer: Self-pay

## 2021-04-06 DIAGNOSIS — G8929 Other chronic pain: Secondary | ICD-10-CM

## 2021-04-06 DIAGNOSIS — M25562 Pain in left knee: Secondary | ICD-10-CM

## 2021-04-06 DIAGNOSIS — M25561 Pain in right knee: Secondary | ICD-10-CM | POA: Diagnosis not present

## 2021-04-07 ENCOUNTER — Encounter: Payer: Self-pay | Admitting: Orthopedic Surgery

## 2021-04-07 DIAGNOSIS — M25561 Pain in right knee: Secondary | ICD-10-CM | POA: Diagnosis not present

## 2021-04-07 DIAGNOSIS — M25562 Pain in left knee: Secondary | ICD-10-CM

## 2021-04-07 DIAGNOSIS — G8929 Other chronic pain: Secondary | ICD-10-CM | POA: Diagnosis not present

## 2021-04-07 NOTE — Progress Notes (Signed)
? ?Office Visit Note ?  ?Patient: Debra Jacobson           ?Date of Birth: 01-Jun-1960           ?MRN: [id number] ?Visit Date: 04/06/2021 ?             ?Requested by: No referring provider defined for this encounter. ?PCP: Pcp, No ? ?Chief Complaint  ?Patient presents with  ? Left Knee - Pain  ? Right Knee - Pain  ? ? ? ? ?HPI: ?Patient is a 61 year old woman with bilateral knee pain.  Patient has edema in her legs currently wears thigh-high compression socks.  Patient denies any specific traumatic injury.  Patient states the knees will give out on her with popping and throbbing.  Patient states the pain is primarily laterally. ? ?Assessment & Plan: ?Visit Diagnoses:  ?1. Chronic pain of left knee   ?2. Chronic pain of right knee   ? ? ?Plan: Both knees were injected she tolerated this well ? ?Follow-Up Instructions: Return if symptoms worsen or fail to improve.  ? ?Ortho Exam ? ?Patient is alert, oriented, no adenopathy, well-dressed, normal affect, normal respiratory effort. ?Examination patient has valgus alignment to both knees she is tender to palpation of the lateral joint line Clauser cruciates are stable there is crepitation with range of motion. ? ?Imaging: ?No results found. ?No images are attached to the encounter. ? ?Labs: ?Lab Results  ?Component Value Date  ? HGBA1C 5.5 10/26/2011  ? HGBA1C 5.4 09/10/2011  ? ESRSEDRATE 14 03/28/2021  ? ESRSEDRATE 37 (H) 09/01/2018  ? LABURIC 9.3 (H) 03/28/2021  ? ? ? ?Lab Results  ?Component Value Date  ? ALBUMIN 3.1 (L) 02/13/2021  ? ALBUMIN 3.1 (L) 01/31/2021  ? ALBUMIN 3.2 (L) 08/05/2020  ? PREALBUMIN 9.1 (L) 10/26/2011  ? ? ?Lab Results  ?Component Value Date  ? MG 2.1 01/31/2021  ? MG 1.7 05/20/2019  ? ?Lab Results  ?Component Value Date  ? VD25OH 27 (L) 03/28/2021  ? VD25OH 45 03/01/2011  ? ? ?Lab Results  ?Component Value Date  ? PREALBUMIN 9.1 (L) 10/26/2011  ? ? ?  Latest Ref Rng & Units 03/28/2021  ?  4:22 PM 02/13/2021  ? 12:58 PM 02/13/2021  ? 12:57 PM  ?CBC  EXTENDED  ?WBC 3.8 - 10.8 Thousand/uL 7.3    4.9    ?RBC 3.80 - 5.10 Million/uL 2.48    2.29    ?Hemoglobin 11.7 - 15.5 g/dL 9.2    8.4    ?HCT 35.0 - 45.0 % 26.6   26.5   25.6    ?Platelets 140 - 400 Thousand/uL 132    135    ?NEUT# 1.7 - 7.7 K/uL   3.3    ?Lymph# 0.7 - 4.0 K/uL   1.2    ? ? ? ?There is no height or weight on file to calculate BMI. ? ?Orders:  ?Orders Placed This Encounter  ?Procedures  ? XR Knee 1-2 Views Right  ? XR Knee 1-2 Views Left  ? ?No orders of the defined types were placed in this encounter. ? ? ? Procedures: ?Large Joint Inj: bilateral knee on 04/07/2021 5:07 PM ?Indications: pain and diagnostic evaluation ?Details: 22 G 1.5 in needle, anteromedial approach ? ?Arthrogram: No ? ?Outcome: tolerated well, no immediate complications ?Procedure, treatment alternatives, risks and benefits explained, specific risks discussed. Consent was given by the patient. Immediately prior to procedure a time out was called to verify the correct patient,  procedure, equipment, support staff and site/side marked as required. Patient was prepped and draped in the usual sterile fashion.  ? ? ? ?Clinical Data: ?No additional findings. ? ?ROS: ? ?All other systems negative, except as noted in the HPI. ?Review of Systems ? ?Objective: ?Vital Signs: LMP 05/01/2012  ? ?Specialty Comments:  ?No specialty comments available. ? ?PMFS History: ?Patient Active Problem List  ? Diagnosis Date Noted  ? Acute alcoholic pancreatitis 39/03/90  ? Alcohol dependence ([place]) 05/20/2019  ? Iron deficiency anemia 09/10/2018  ? Eczema 12/09/2011  ? Status post gastric bypass for obesity 05/30/2011  ? ?Past Medical History:  ?Diagnosis Date  ? Allergy   ? all rhinitis  ? Dependent edema   ? Dermatitis   ? GERD (gastroesophageal reflux disease)   ? Hypertension   ?  ?Family History  ?Problem Relation Age of Onset  ? Hyperlipidemia Mother   ?  ?Past Surgical History:  ?Procedure Laterality Date  ? CESAREAN SECTION    ? x2  ? TUBAL  LIGATION    ? ?Social History  ? ?Occupational History  ? Not on file  ?Tobacco Use  ? Smoking status: Former  ?  Types: Cigarettes  ?  Quit date: 08/29/1991  ?  Years since quitting: 29.6  ? Smokeless tobacco: Never  ?Vaping Use  ? Vaping Use: Never used  ?Substance and Sexual Activity  ? Alcohol use: Yes  ?  Comment: Patient drinks 2 40ox beers multiple days a week  ? Drug use: No  ? Sexual activity: Not on file  ? ? ? ? ? ?

## 2021-04-17 ENCOUNTER — Encounter: Payer: Self-pay | Admitting: Hematology

## 2021-04-18 ENCOUNTER — Encounter: Payer: Self-pay | Admitting: Hematology

## 2021-04-18 ENCOUNTER — Ambulatory Visit: Payer: 59 | Admitting: Nurse Practitioner

## 2021-04-18 ENCOUNTER — Encounter: Payer: Self-pay | Admitting: Nurse Practitioner

## 2021-04-18 VITALS — BP 112/64 | HR 57 | Ht 67.0 in | Wt 139.6 lb

## 2021-04-18 DIAGNOSIS — Z1211 Encounter for screening for malignant neoplasm of colon: Secondary | ICD-10-CM | POA: Diagnosis not present

## 2021-04-18 DIAGNOSIS — R634 Abnormal weight loss: Secondary | ICD-10-CM

## 2021-04-18 DIAGNOSIS — R197 Diarrhea, unspecified: Secondary | ICD-10-CM | POA: Diagnosis not present

## 2021-04-18 NOTE — Progress Notes (Signed)
? ? ?Assessment  ? ?Patient profile:  ?Debra Jacobson is a 61 y.o. female new to practice, self referred for screening colonoscopy. Past medical history significant for gastric bypass in 2014, B12 deficiency , eczema, possible Etoh pancreatitis in 2021, carpal tunnel, smoldering multiple myeloma, multifactorial anemia see PMH below for any additional history ? ?Colon cancer screening, she reports a normal colonoscopy 11 years ago.  No alarm symptoms.  No family history of colon cancer.   ? ?8 month history postprandial oily diarrhea with unintentional weight loss of about 20 pounds.  Given chronicity and characteristics of the diarrhea infectious etiology seems unlikely. Rule out exocrine pancreatic insufficiency especially given the weight loss.  Rule out microscopic colitis though this does not generally result in oily stools ? ?Chronic multl-factorial macrocytic anemia /history of gastric bypass/ B12 deficiency / smoldering multiple myeloma, followed by hematology - Dr. Irene Limbo ? ?Eczema, stopped Dupixent for months.  ? ?Plan  ? ?Schedule for a colonoscopy. The risks and benefits of colonoscopy with possible polypectomy / biopsies were discussed and the patient agrees to proceed.  ?Stool fecal elastase.  ?If elastase normal then may get random colon biopsies at time of colonoscopy  ? ?History of Present Illness  ? ?Chief complaint:  colon cancer screening  ? ?Patient's last colonoscopy 11 years ago at [facility]. No polyps, told to return in 10 years. She she has [facility] of colon cancer. No blood in stool.  ? ?She had a gastric bypass in 2013 ? ?Over the last 8 months she has been having postprandial loose oily stools regardless of what or how much she eats. She has lost about 20 pounds, unintentionally over the last 6 months. Unable to relate bowel changes to any medication / diet changes. No blood in stool. No significant abdominal pain associated with the diarrhea. No solid BMs in months.  ? ? ?Previous  Labs / Imaging:: ? ?  Latest Ref Rng & Units 03/28/2021  ?  4:22 PM 02/13/2021  ? 12:58 PM 02/13/2021  ? 12:57 PM  ?CBC  ?WBC 3.8 - 10.8 Thousand/uL 7.3    4.9    ?Hemoglobin 11.7 - 15.5 g/dL 9.2    8.4    ?Hematocrit 35.0 - 45.0 % 26.6   26.5   25.6    ?Platelets 140 - 400 Thousand/uL 132    135    ? ? ? ?  Latest Ref Rng & Units 03/28/2021  ?  4:22 PM 02/13/2021  ? 12:57 PM 01/31/2021  ?  1:47 PM  ?CMP  ?Glucose 65 - 99 mg/dL 85   93   88    ?BUN 7 - 25 mg/dL $Remove'15   7   10    'wPjuLfx$ ?Creatinine 0.50 - 1.05 mg/dL 0.94   1.05   0.93    ?Sodium 135 - 146 mmol/L 145   142   138    ?Potassium 3.5 - 5.3 mmol/L 4.6   5.0   4.4    ?Chloride 98 - 110 mmol/L 117   115   112    ?CO2 20 - 32 mmol/L $RemoveB'15   24   21    'VxGGDNsN$ ?Calcium 8.6 - 10.4 mg/dL 8.6   8.4   8.2    ?Total Protein 6.1 - 8.1 g/dL 6.8   6.3   6.7    ?Total Bilirubin 0.2 - 1.2 mg/dL 0.4   0.4   0.3    ?Alkaline Phos 38 - 126 U/L  72  76    ?AST 10 - 35 U/L 108   139   99    ?ALT 6 - 29 U/L 79   88   109    ? ? ?Past Medical History:  ?Diagnosis Date  ? Allergy   ? all rhinitis  ? Dependent edema   ? Dermatitis   ? GERD (gastroesophageal reflux disease)   ? Hypertension   ? ?Past Surgical History:  ?Procedure Laterality Date  ? CESAREAN SECTION    ? x2  ? TUBAL LIGATION    ? ?Family History  ?Problem Relation Age of Onset  ? Hyperlipidemia Mother   ? Non-Hodgkin's lymphoma Father   ? Diabetes Maternal Grandmother   ? Colon cancer Neg Hx   ? Stomach cancer Neg Hx   ? Esophageal cancer Neg Hx   ? Colon polyps Neg Hx   ? ?Social History  ? ?Tobacco Use  ? Smoking status: Former  ?  Types: Cigarettes  ?  Quit date: 08/29/1991  ?  Years since quitting: 29.6  ? Smokeless tobacco: Never  ?Vaping Use  ? Vaping Use: Never used  ?Substance Use Topics  ? Alcohol use: Yes  ?  Comment: Patient drinks 2 40ox beers multiple days a week  ? Drug use: No  ? ?Current Outpatient Medications  ?Medication Sig Dispense Refill  ? cyanocobalamin (,VITAMIN B-12,) 1000 MCG/ML injection INJECT 1 MLS EVERY WEEK  FOR 4 WEEKS THEN INJECT 1 ML MONTHLY THEREAFTER 6 mL 1  ? DUPIXENT 300 MG/2ML prefilled syringe Inject 300 mg into the skin every 14 (fourteen) days.    ? folic acid (FOLVITE) 1 MG tablet Take 1 tablet (1 mg total) by mouth daily. 30 tablet 1  ? hydrOXYzine (VISTARIL) 25 MG capsule Take 1-2 capsules (25-50 mg total) by mouth at bedtime as needed. 30 capsule 1  ? meloxicam (MOBIC) 15 MG tablet Take 15 mg by mouth daily.    ? thiamine 100 MG tablet Take 1 tablet (100 mg total) by mouth daily. 30 tablet 1  ? triamcinolone cream (KENALOG) 0.1 % Apply 1 application topically 4 (four) times daily as needed. 30 g 0  ? Vitamin D, Ergocalciferol, (DRISDOL) 1.25 MG (50000 UNIT) CAPS capsule TAKE ONE CAPSULE BY MOUTH WEEKLY 12 capsule 4  ? methocarbamol (ROBAXIN) 500 MG tablet Take 500 mg by mouth 2 (two) times daily.    ? ?No current facility-administered medications for this visit.  ? ?No Known Allergies ? ? ?Review of Systems: ?All systems reviewed and negative except where noted in HPI.  ? ?Physical Exam  ? ?Wt Readings from Last 3 Encounters:  ?04/18/21 139 lb 9.6 oz (63.3 kg)  ?02/13/21 145 lb 4.8 oz (65.9 kg)  ?08/05/20 162 lb 8 oz (73.7 kg)  ? ? ?BP 112/64   Pulse (!) 57   Ht $R'5\' 7"'kh$  (1.702 m)   Wt 139 lb 9.6 oz (63.3 kg)   LMP 05/01/2012   SpO2 99%   BMI 21.86 kg/m?  ?Constitutional:  Generally well appearing female in no acute distress. ?Psychiatric: Pleasant. Normal mood and affect. Behavior is normal. ?EENT: Pupils normal.  Conjunctivae are normal. No scleral icterus. ?Neck supple.  ?Cardiovascular: Normal rate, regular rhythm. No edema ?Pulmonary/chest: Effort normal and breath sounds normal. No wheezing, rales or rhonchi. ?Abdominal: Soft, nondistended, nontender. Bowel sounds active throughout. There are no masses palpable. No hepatomegaly. ?Neurological: Alert and oriented to person place and time. ?Skin: Skin is warm and dry. No rashes noted. ? ?  Tye Savoy, NP  04/18/2021, 11:45 AM ? ?Cc:  ?Nolene Ebbs, MD ? ? ? ? ? ? ? ?

## 2021-04-18 NOTE — Patient Instructions (Signed)
Your provider has requested that you go to the basement level for lab work before leaving today. Press "B" on the elevator. The lab is located at the first door on the left as you exit the elevator.  ? ?You have been scheduled for a colonoscopy. Please follow written instructions given to you at your visit today.  ?Please pick up your prep supplies at the pharmacy within the next 1-3 days. ?If you use inhalers (even only as needed), please bring them with you on the day of your procedure.  ? ?Due to recent changes in healthcare laws, you may see the results of your imaging and laboratory studies on MyChart before your provider has had a chance to review them.  We understand that in some cases there may be results that are confusing or concerning to you. Not all laboratory results come back in the same time frame and the provider may be waiting for multiple results in order to interpret others.  Please give [place] 48 hours in order for your provider to thoroughly review all the results before contacting the office for clarification of your results.   ? ?If you are age 63 or older, your body mass index should be between 23-30. Your Body mass index is 21.86 kg/m?Marland Kitchen If this is out of the aforementioned range listed, please consider follow up with your Primary Care Provider. ? ?If you are age 14 or younger, your body mass index should be between 19-25. Your Body mass index is 21.86 kg/m?Marland Kitchen If this is out of the aformentioned range listed, please consider follow up with your Primary Care Provider.  ? ?________________________________________________________ ? ?The [facility] providers would like to encourage you to use [facility] to communicate with providers for non-urgent requests or questions.  Due to long hold times on the telephone, sending your provider a message by [facility] may be a faster and more efficient way to get a response.  Please allow 48 business hours for a response.  Please remember that this is for non-urgent  requests.  ?_______________________________________________________  ? ?I appreciate the  opportunity to care for you ? ?Thank You  ? ?[place] ?

## 2021-04-27 ENCOUNTER — Other Ambulatory Visit: Payer: Self-pay

## 2021-04-27 DIAGNOSIS — D472 Monoclonal gammopathy: Secondary | ICD-10-CM

## 2021-04-28 ENCOUNTER — Inpatient Hospital Stay: Payer: 59 | Admitting: Hematology

## 2021-04-28 ENCOUNTER — Inpatient Hospital Stay: Payer: 59 | Attending: Hematology

## 2021-05-04 ENCOUNTER — Encounter: Payer: Self-pay | Admitting: Hematology

## 2021-05-06 IMAGING — MG DIGITAL DIAGNOSTIC BILATERAL MAMMOGRAM WITH TOMO AND CAD
6 of 12 series · 6 of 36 positions shown · non-contrast
Comparison: Previous exam(s).

CLINICAL DATA: 57-year-old female presenting for evaluation of
palpable lumps in the left breast at 10, 12 and 2 o'clock.

EXAM:
DIGITAL DIAGNOSTIC BILATERAL MAMMOGRAM WITH CAD AND TOMO
ULTRASOUND LEFT BREAST

[L MLO synth-2D]
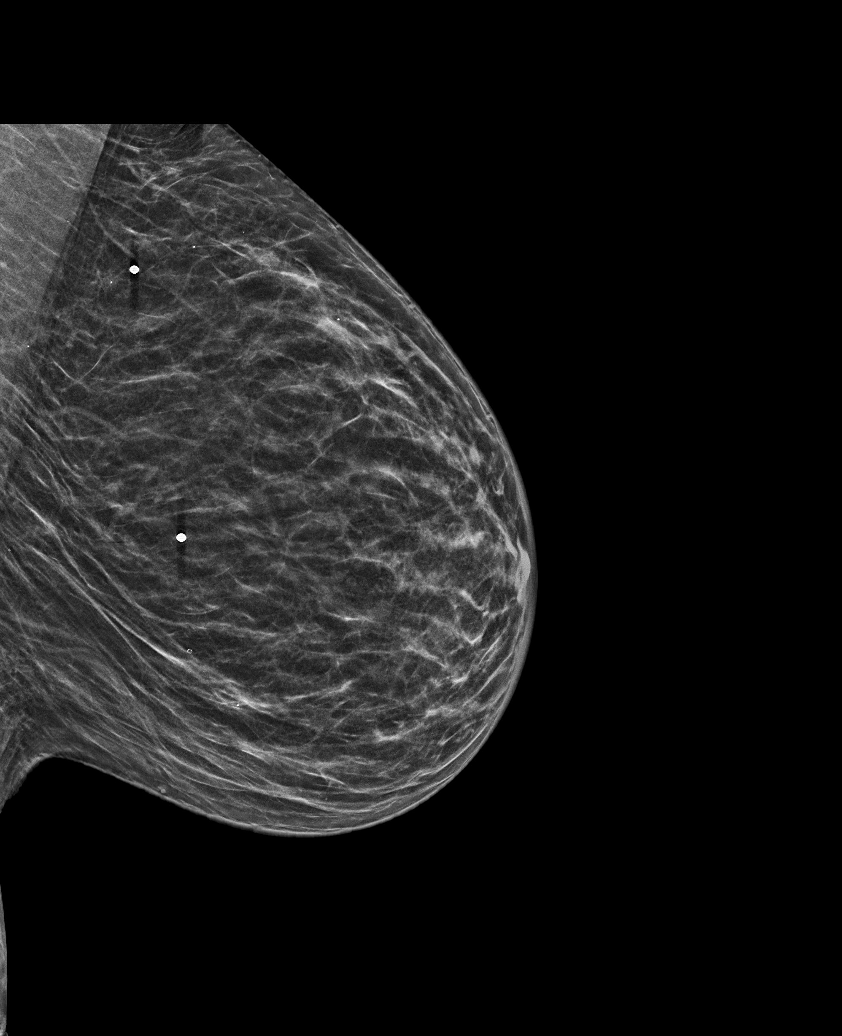

[L TAN synth-2D (1 of 2)]
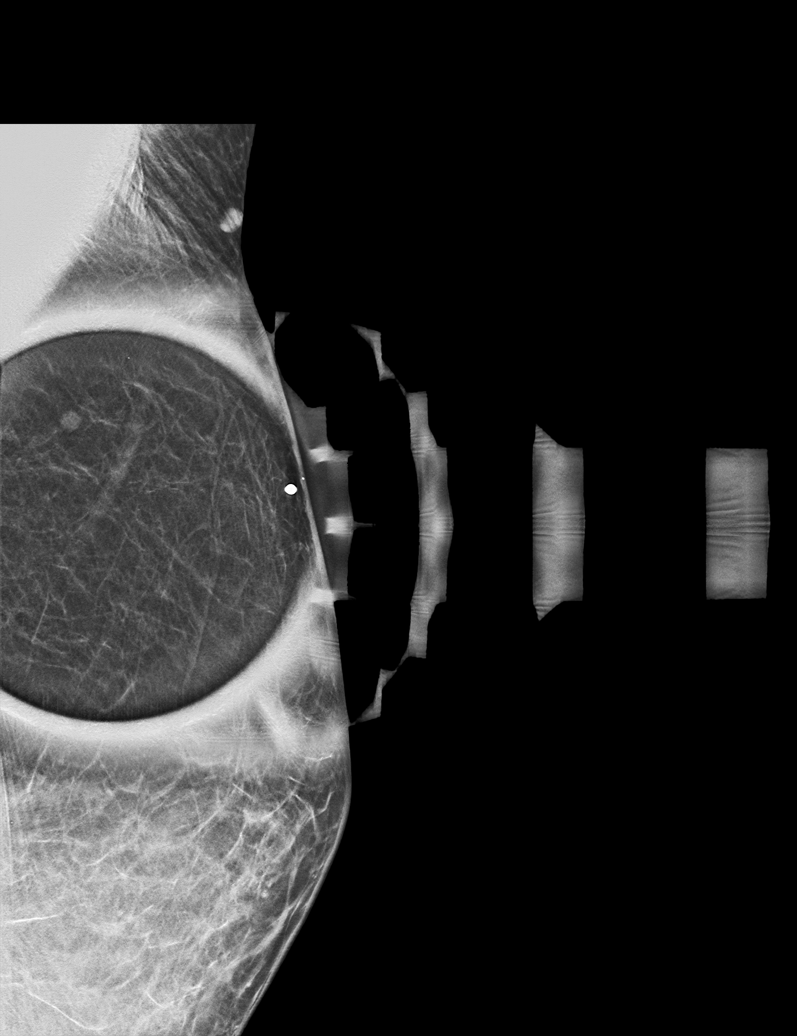

[L CC synth-2D]
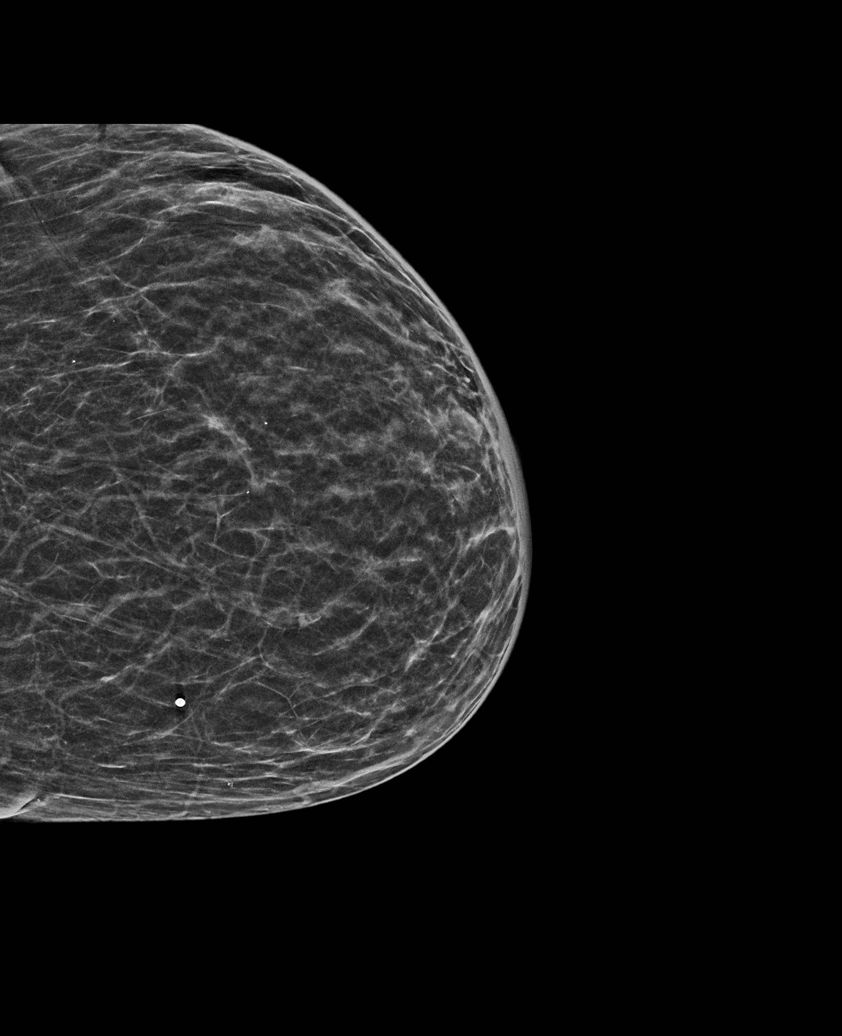

[R CC synth-2D]
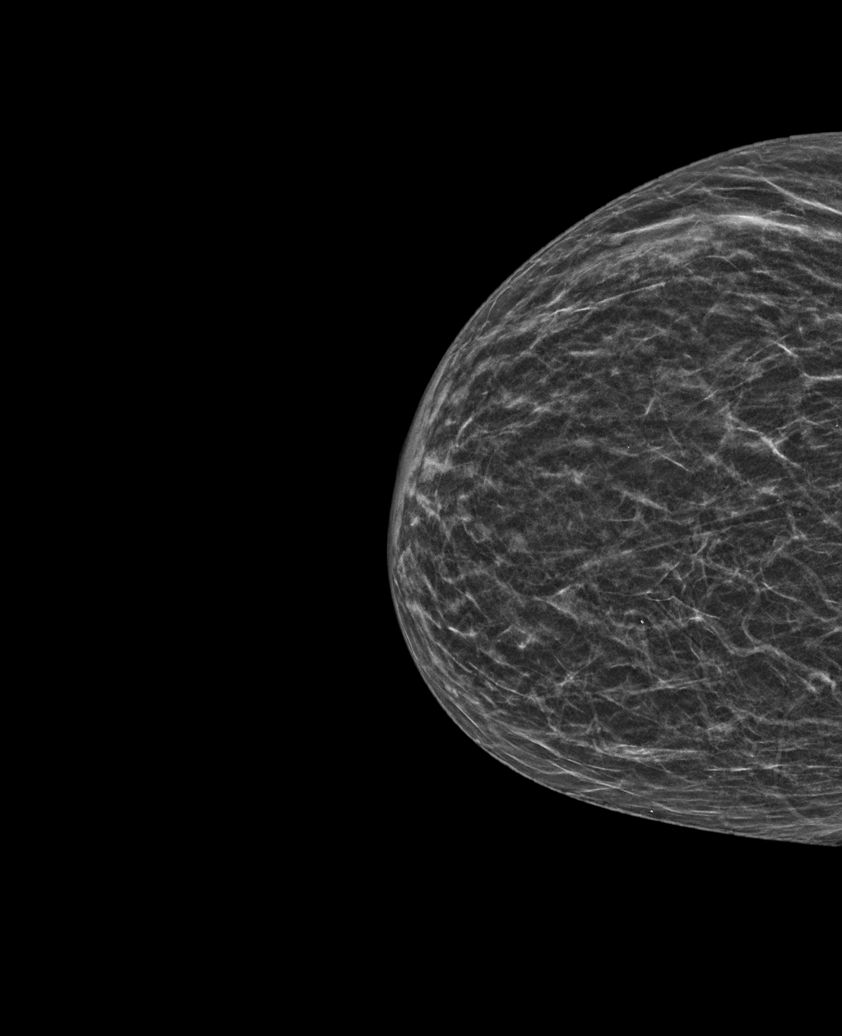

[R MLO synth-2D]
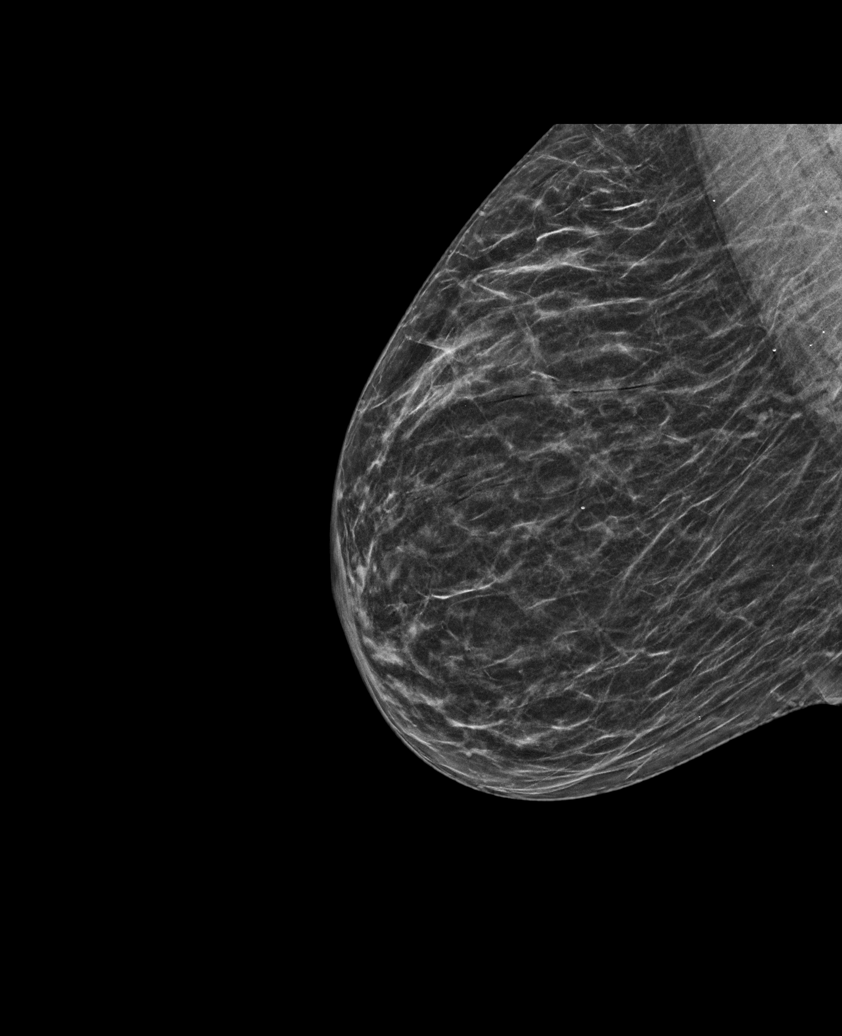

[L TAN synth-2D (2 of 2)]
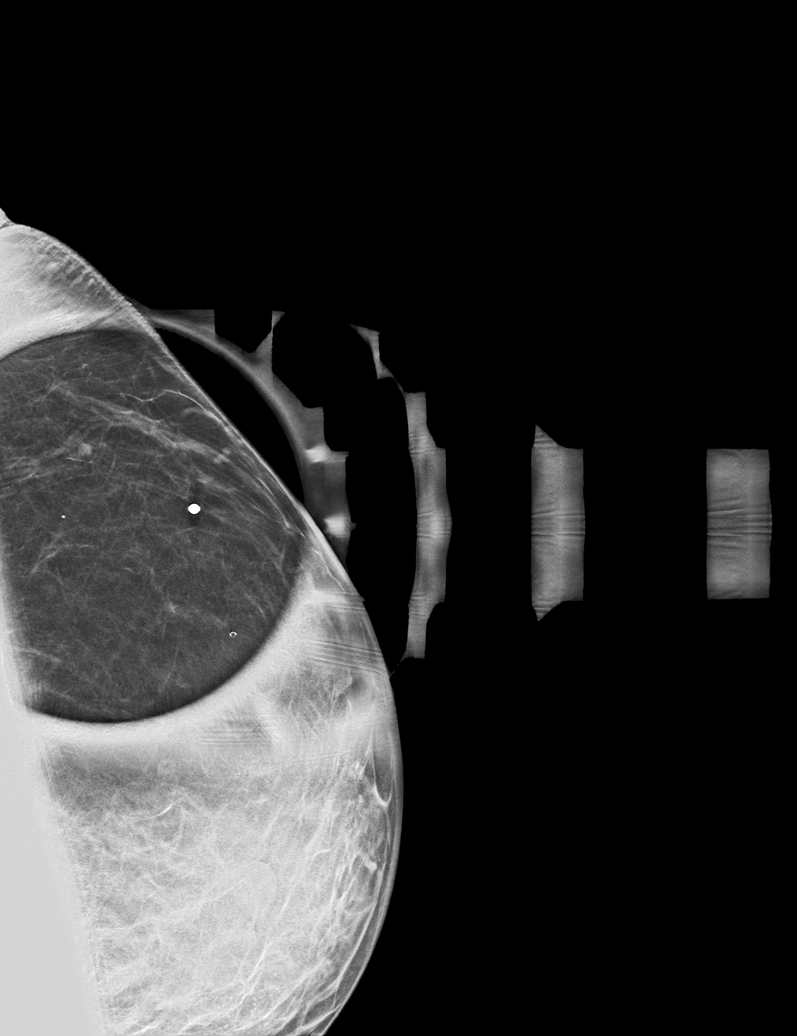

[6 of 36 positions shown; findings below may reference images not displayed]

ACR Breast Density Category b: There are scattered areas of
fibroglandular density.
FINDINGS: BBs have been placed at the 10 and 2 o'clock position in the
posterior left breast indicating to the 3 palpable sites of concern.
No suspicious masses are seen deep to the palpable markers. At the
12 o'clock position, posterior depth there is a 3 mm oval
circumscribed mass. No suspicious calcifications, masses or areas of
distortion are seen in the bilateral breasts.

Mammographic images were processed with CAD.

On physical exam, there is a tiny superficial mass palpated at the
12 o'clock position in the left breast. No suspicious masses are
identified in the upper-outer or upper inner quadrants of the left
breast.

Ultrasound of the left breast at 12 o'clock, 7 cm from the nipple
demonstrates a superficial circumscribed oval mass measuring 3 x 2 x
3 mm. No suspicious findings are seen in the upper outer or upper
inner quadrants of the left breast.
IMPRESSION: 1. A benign cyst is seen at the palpable site in the left breast at
12 o'clock.

2. No mammographic or targeted sonographic abnormalities are seen in
the upper outer or upper inner quadrants of the left breast at the
palpable sites of concern.

3.  No mammographic evidence of malignancy in the bilateral breasts.

RECOMMENDATION:
1. Clinical follow-up recommended for the palpable areas of concern
in the bilateral breasts. Any further workup should be based on
clinical grounds.

2.  Screening mammogram in one year.(Code:AN-[id number])

I have discussed the findings and recommendations with the patient.
Results were also provided in writing at the conclusion of the
visit. If applicable, a reminder letter will be sent to the patient
regarding the next appointment.

BI-RADS CATEGORY  2: Benign.

## 2021-05-15 ENCOUNTER — Other Ambulatory Visit: Payer: Self-pay | Admitting: Internal Medicine

## 2021-05-15 DIAGNOSIS — R7401 Elevation of levels of liver transaminase levels: Secondary | ICD-10-CM

## 2021-05-17 ENCOUNTER — Encounter: Payer: Self-pay | Admitting: Hematology

## 2021-05-25 ENCOUNTER — Encounter: Payer: Self-pay | Admitting: Internal Medicine

## 2021-05-25 ENCOUNTER — Ambulatory Visit (AMBULATORY_SURGERY_CENTER): Payer: 59 | Admitting: Internal Medicine

## 2021-05-25 VITALS — BP 108/61 | HR 83 | Temp 97.1°F | Resp 18 | Ht 67.0 in | Wt 139.0 lb

## 2021-05-25 DIAGNOSIS — K529 Noninfective gastroenteritis and colitis, unspecified: Secondary | ICD-10-CM

## 2021-05-25 DIAGNOSIS — K5730 Diverticulosis of large intestine without perforation or abscess without bleeding: Secondary | ICD-10-CM

## 2021-05-25 DIAGNOSIS — R197 Diarrhea, unspecified: Secondary | ICD-10-CM

## 2021-05-25 DIAGNOSIS — K648 Other hemorrhoids: Secondary | ICD-10-CM | POA: Diagnosis not present

## 2021-05-25 DIAGNOSIS — R634 Abnormal weight loss: Secondary | ICD-10-CM | POA: Diagnosis not present

## 2021-05-25 HISTORY — PX: COLONOSCOPY: SHX174

## 2021-05-25 MED ORDER — SODIUM CHLORIDE 0.9 % IV SOLN: 500.0000 mL | Freq: Once | INTRAVENOUS | Status: DC

## 2021-05-25 NOTE — Progress Notes (Signed)
PT taken to PACU. Monitors in place. VSS. Report given to RN. 

## 2021-05-25 NOTE — Progress Notes (Signed)
Called to room to assist during endoscopic procedure.  Patient ID and intended procedure confirmed with present staff. Received instructions for my participation in the procedure from the performing physician.  

## 2021-05-25 NOTE — Patient Instructions (Signed)
Handout given for diverticulosis.  Await pathology results.  YOU HAD AN ENDOSCOPIC PROCEDURE TODAY AT [facility]:   Refer to the procedure report that was given to you for any specific questions about what was found during the examination.  If the procedure report does not answer your questions, please call your gastroenterologist to clarify.  If you requested that your care partner not be given the details of your procedure findings, then the procedure report has been included in a sealed envelope for you to review at your convenience later.  YOU SHOULD EXPECT: Some feelings of bloating in the abdomen. Passage of more gas than usual.  Walking can help get rid of the air that was put into your GI tract during the procedure and reduce the bloating. If you had a lower endoscopy (such as a colonoscopy or flexible sigmoidoscopy) you may notice spotting of blood in your stool or on the toilet paper. If you underwent a bowel prep for your procedure, you may not have a normal bowel movement for a few days.  Please Note:  You might notice some irritation and congestion in your nose or some drainage.  This is from the oxygen used during your procedure.  There is no need for concern and it should clear up in a day or so.  SYMPTOMS TO REPORT IMMEDIATELY:  Following lower endoscopy (colonoscopy or flexible sigmoidoscopy):  Excessive amounts of blood in the stool  Significant tenderness or worsening of abdominal pains  Swelling of the abdomen that is new, acute  Fever of 100F or higher   For urgent or emergent issues, a gastroenterologist can be reached at any hour by calling [phone]. Do not use MyChart messaging for urgent concerns.    DIET:  We do recommend a small meal at first, but then you may proceed to your regular diet.  Drink plenty of fluids but you should avoid alcoholic beverages for 24 hours.  ACTIVITY:  You should plan to take it easy for the rest of today and you  should NOT DRIVE or use heavy machinery until tomorrow (because of the sedation medicines used during the test).    FOLLOW UP: Our staff will call the number listed on your records 48-72 hours following your procedure to check on you and address any questions or concerns that you may have regarding the information given to you following your procedure. If we do not reach you, we will leave a message.  We will attempt to reach you two times.  During this call, we will ask if you have developed any symptoms of COVID 19. If you develop any symptoms (ie: fever, flu-like symptoms, shortness of breath, cough etc.) before then, please call [phone].  If you test positive for Covid 19 in the 2 weeks post procedure, please call and report this information to [place].    If any biopsies were taken you will be contacted by phone or by letter within the next 1-3 weeks.  Please call us at [phone] if you have not heard about the biopsies in 3 weeks.    SIGNATURES/CONFIDENTIALITY: You and/or your care partner have signed paperwork which will be entered into your electronic medical record.  These signatures attest to the fact that that the information above on your After Visit Summary has been reviewed and is understood.  Full responsibility of the confidentiality of this discharge information lies with you and/or your care-partner.

## 2021-05-25 NOTE — Progress Notes (Signed)
GASTROENTEROLOGY PROCEDURE H&P NOTE   Primary Care Physician: Nolene Ebbs, MD    Reason for Procedure:  Colon cancer screening, weight loss with diarrhea  Plan:    Colonoscopy  Patient is appropriate for endoscopic procedure(s) in the ambulatory ([facility]) setting.  The nature of the procedure, as well as the risks, benefits, and alternatives were carefully and thoroughly reviewed with the patient. Ample time for discussion and questions allowed. The patient understood, was satisfied, and agreed to proceed.     HPI: Debra Jacobson is a 61 y.o. female who presents for colonoscopy.  Medical history as below.  Tolerated the prep.  No recent chest pain or shortness of breath.  No abdominal pain today.  Past Medical History:  Diagnosis Date   Allergy    all rhinitis   Dependent edema    Dermatitis    GERD (gastroesophageal reflux disease)    Hypertension     Past Surgical History:  Procedure Laterality Date   CESAREAN SECTION     x2   TUBAL LIGATION      Prior to Admission medications   Medication Sig Start Date End Date Taking? Authorizing Provider  cyanocobalamin (,VITAMIN B-12,) 1000 MCG/ML injection INJECT 1 MLS EVERY WEEK FOR 4 WEEKS THEN INJECT 1 ML MONTHLY THEREAFTER 03/01/21  Yes Brunetta Genera, MD  meloxicam (MOBIC) 15 MG tablet Take 15 mg by mouth daily.   Yes [provider]  methocarbamol (ROBAXIN) 500 MG tablet Take 500 mg by mouth 2 (two) times daily. 04/11/21  Yes [provider]  Vitamin D, Ergocalciferol, (DRISDOL) 1.25 MG (50000 UNIT) CAPS capsule TAKE ONE CAPSULE BY MOUTH WEEKLY 08/22/20  Yes Brunetta Genera, MD  DUPIXENT 300 MG/2ML prefilled syringe Inject 300 mg into the skin every 14 (fourteen) days. Patient not taking: Reported on 05/25/2021 04/09/19   [provider]  folic acid (FOLVITE) 1 MG tablet Take 1 tablet (1 mg total) by mouth daily. Patient not taking: Reported on 05/25/2021 01/31/21   Sherrell Puller, PA-C   hydrOXYzine (VISTARIL) 25 MG capsule Take 1-2 capsules (25-50 mg total) by mouth at bedtime as needed. Patient not taking: Reported on 05/25/2021 08/05/20   Brunetta Genera, MD  thiamine 100 MG tablet Take 1 tablet (100 mg total) by mouth daily. Patient not taking: Reported on 05/25/2021 01/31/21   Sherrell Puller, PA-C  triamcinolone cream (KENALOG) 0.1 % Apply 1 application topically 4 (four) times daily as needed. Patient not taking: Reported on 05/25/2021 05/22/16   Clayton Bibles, PA-C    Current Outpatient Medications  Medication Sig Dispense Refill   cyanocobalamin (,VITAMIN B-12,) 1000 MCG/ML injection INJECT 1 MLS EVERY WEEK FOR 4 WEEKS THEN INJECT 1 ML MONTHLY THEREAFTER 6 mL 1   meloxicam (MOBIC) 15 MG tablet Take 15 mg by mouth daily.     methocarbamol (ROBAXIN) 500 MG tablet Take 500 mg by mouth 2 (two) times daily.     Vitamin D, Ergocalciferol, (DRISDOL) 1.25 MG (50000 UNIT) CAPS capsule TAKE ONE CAPSULE BY MOUTH WEEKLY 12 capsule 4   DUPIXENT 300 MG/2ML prefilled syringe Inject 300 mg into the skin every 14 (fourteen) days. (Patient not taking: Reported on 3/55/9741)     folic acid (FOLVITE) 1 MG tablet Take 1 tablet (1 mg total) by mouth daily. (Patient not taking: Reported on 05/25/2021) 30 tablet 1   hydrOXYzine (VISTARIL) 25 MG capsule Take 1-2 capsules (25-50 mg total) by mouth at bedtime as needed. (Patient not taking: Reported on 05/25/2021)  30 capsule 1   thiamine 100 MG tablet Take 1 tablet (100 mg total) by mouth daily. (Patient not taking: Reported on 05/25/2021) 30 tablet 1   triamcinolone cream (KENALOG) 0.1 % Apply 1 application topically 4 (four) times daily as needed. (Patient not taking: Reported on 05/25/2021) 30 g 0   Current Facility-Administered Medications  Medication Dose Route Frequency Provider Last Rate Last Admin   0.9 %  sodium chloride infusion  500 mL Intravenous Once Jaton Eilers, Lajuan Lines, MD        Allergies as of 05/25/2021   (No Known Allergies)    Family  History  Problem Relation Age of Onset   Hyperlipidemia Mother    Non-Hodgkin's lymphoma Father    Diabetes Maternal Grandmother    Colon cancer Neg Hx    Stomach cancer Neg Hx    Esophageal cancer Neg Hx    Colon polyps Neg Hx     Social History   Socioeconomic History   Marital status: Married    Spouse name: Not on file   Number of children: 3   Years of education: Not on file   Highest education level: Not on file  Occupational History   Occupation: Travel nurse  Tobacco Use   Smoking status: Former    Types: Cigarettes    Quit date: 08/29/1991    Years since quitting: 29.7   Smokeless tobacco: Never  Vaping Use   Vaping Use: Never used  Substance and Sexual Activity   Alcohol use: Yes    Comment: Patient drinks 2 40ox beers multiple days a week   Drug use: No   Sexual activity: Not on file  Other Topics Concern   Not on file  Social History Narrative   Not on file   Social Determinants of Health   Financial Resource Strain: Not on file  Food Insecurity: Not on file  Transportation Needs: Not on file  Physical Activity: Not on file  Stress: Not on file  Social Connections: Not on file  Intimate Partner Violence: Not on file    Physical Exam: Vital signs in last 24 hours: '@BP'$  90/61   Pulse 63   Temp (!) 97.1 F (36.2 C)   Ht '5\' 7"'$  (1.702 m)   Wt 139 lb (63 kg)   LMP 05/01/2012   SpO2 100%   BMI 21.77 kg/m  GEN: NAD EYE: Sclerae anicteric ENT: MMM CV: Non-tachycardic Pulm: CTA b/l GI: Soft, NT/ND NEURO:  Alert & Oriented x 3   Zenovia Jarred, MD [facility]  05/25/2021 3:46 PM

## 2021-05-25 NOTE — Op Note (Signed)
[facility] Patient Name: Debra Jacobson Procedure Date: 05/25/2021 3:51 PM MRN: [id number] Endoscopist: Jerene Bears , MD Age: 61 Referring MD:  Date of Birth: [birth year] Gender: Female Account #: [id number] Procedure:                Colonoscopy Indications:              Clinically significant diarrhea of unexplained                            origin, Weight loss Medicines:                Monitored Anesthesia Care Procedure:                Pre-Anesthesia Assessment:                           - Prior to the procedure, a History and Physical                            was performed, and patient medications and                            allergies were reviewed. The patient's tolerance of                            previous anesthesia was also reviewed. The risks                            and benefits of the procedure and the sedation                            options and risks were discussed with the patient.                            All questions were answered, and informed consent                            was obtained. Prior Anticoagulants: The patient has                            taken no previous anticoagulant or antiplatelet                            agents. ASA Grade Assessment: II - A patient with                            mild systemic disease. After reviewing the risks                            and benefits, the patient was deemed in                            satisfactory condition to undergo the procedure.  After obtaining informed consent, the colonoscope                            was passed under direct vision. Throughout the                            procedure, the patient's blood pressure, pulse, and                            oxygen saturations were monitored continuously. The                            PCF-HQ190L Colonoscope was introduced through the                            anus and advanced to the cecum, identified by                             appendiceal orifice and ileocecal valve. The                            colonoscopy was performed without difficulty. The                            patient tolerated the procedure well. The quality                            of the bowel preparation was fair clearing to good                            (with exception of cecum which could not be                            completely cleared). The ileocecal valve,                            appendiceal orifice, and rectum were photographed. Scope In: 4:05:07 PM Scope Out: 4:18:14 PM Scope Withdrawal Time: 0 hours 6 minutes 29 seconds  Total Procedure Duration: 0 hours 13 minutes 7 seconds  Findings:                 The digital rectal exam was normal.                           A few small-mouthed diverticula were found in the                            descending colon and ascending colon.                           Internal hemorrhoids were found during                            retroflexion. The hemorrhoids were small.  The exam was otherwise without abnormality.                           Biopsies for histology were taken with a cold                            forceps from the right colon and left colon for                            evaluation of microscopic colitis. Complications:            No immediate complications. Estimated Blood Loss:     Estimated blood loss was minimal. Impression:               - Diverticulosis in the descending colon and in the                            ascending colon.                           - Internal hemorrhoids.                           - The examination was otherwise normal.                           - Biopsies were taken with a cold forceps from the                            right colon and left colon for evaluation of                            microscopic colitis. Recommendation:           - Patient has a contact number available for                             emergencies. The signs and symptoms of potential                            delayed complications were discussed with the                            patient. Return to normal activities tomorrow.                            Written discharge instructions were provided to the                            patient.                           - Resume previous diet.                           - Continue present medications.                           -  Await pathology results. If biopsies normal then                            fecal elastase should be submitted.                           - If further weight loss then CT abd/pelvis is                            recommended.                           - Repeat colonoscopy is recommended. The                            colonoscopy date will be determined after pathology                            results from today's exam become available for                            review. Jerene Bears, MD 05/25/2021 4:25:33 PM This report has been signed electronically.

## 2021-06-01 ENCOUNTER — Telehealth: Payer: Self-pay | Admitting: Orthopedic Surgery

## 2021-06-01 ENCOUNTER — Encounter: Payer: Self-pay | Admitting: Hematology

## 2021-06-01 NOTE — Telephone Encounter (Signed)
Pt called and would like to know if she can get an mri ordered for both her knees. She states her primary care doctor told her to reach out to [facility].   Cb [phone]

## 2021-06-01 NOTE — Telephone Encounter (Signed)
error 

## 2021-06-01 NOTE — Telephone Encounter (Signed)
Can you please call pt and make an appt. Pt was in the ofice once in the beginning of April for bilat knee pain and advised to come back if she did not improve after cortisone injections.

## 2021-06-02 ENCOUNTER — Encounter: Payer: Self-pay | Admitting: Internal Medicine

## 2021-06-12 ENCOUNTER — Ambulatory Visit: Payer: 59 | Admitting: Orthopedic Surgery

## 2021-06-12 ENCOUNTER — Other Ambulatory Visit: Payer: 59

## 2021-06-12 ENCOUNTER — Encounter: Payer: Self-pay | Admitting: Hematology

## 2021-06-12 ENCOUNTER — Encounter: Payer: Self-pay | Admitting: Orthopedic Surgery

## 2021-06-12 DIAGNOSIS — M25562 Pain in left knee: Secondary | ICD-10-CM | POA: Diagnosis not present

## 2021-06-12 DIAGNOSIS — G8929 Other chronic pain: Secondary | ICD-10-CM | POA: Diagnosis not present

## 2021-06-12 DIAGNOSIS — M79652 Pain in left thigh: Secondary | ICD-10-CM | POA: Diagnosis not present

## 2021-06-12 DIAGNOSIS — M25561 Pain in right knee: Secondary | ICD-10-CM

## 2021-06-12 DIAGNOSIS — R197 Diarrhea, unspecified: Secondary | ICD-10-CM

## 2021-06-12 DIAGNOSIS — Z1211 Encounter for screening for malignant neoplasm of colon: Secondary | ICD-10-CM

## 2021-06-12 DIAGNOSIS — R634 Abnormal weight loss: Secondary | ICD-10-CM

## 2021-06-12 NOTE — Progress Notes (Signed)
Office Visit Note   Patient: Debra Jacobson           Date of Birth: 1960/05/05           MRN: [id number] Visit Date: 06/12/2021              Requested by: Nolene Ebbs, MD [address] [place],  [place] [address] PCP: Nolene Ebbs, MD  Chief Complaint  Patient presents with   Left Knee - Follow-up    S/p bilat knee injections 04/06/21   Right Knee - Follow-up      HPI: Patient is a 61 year old woman who is seen in follow-up for osteoarthritis bilateral knees.  She had an injection in both knees 2 months ago and states that the injection is still working.  Patient states she has deep thigh pain left leg states her femur hurts.  She denies any back pain she denies any radicular symptoms.  She denies any groin pain with range of motion or ambulation.  The pain is at rest.  Patient has a history of multiple myeloma.  Last PET scan was around 2012.  Assessment & Plan: Visit Diagnoses:  1. Chronic pain of left knee   2. Chronic pain of right knee   3. Musculoskeletal thigh pain, left     Plan: Recommend that she follow-up with oncology she most likely would need a repeat PET scan.  She has no clinical signs or symptoms of sciatic nerve irritation or hip arthritis.  Paperwork was completed for the [employer] of nursing stating that patient had steroid injections for both knees on April 6.  Follow-Up Instructions: Return if symptoms worsen or fail to improve.   Ortho Exam  Patient is alert, oriented, no adenopathy, well-dressed, normal affect, normal respiratory effort. Examination patient has a normal gait there is no pain with range of motion of the hip knee or ankle.  She has negative straight leg raise she has no focal motor weakness.  Imaging: No results found. No images are attached to the encounter.  Labs: Lab Results  Component Value Date   HGBA1C 5.5 10/26/2011   HGBA1C 5.4 09/10/2011   ESRSEDRATE 14 03/28/2021   ESRSEDRATE 37 (H) 09/01/2018    LABURIC 9.3 (H) 03/28/2021     Lab Results  Component Value Date   ALBUMIN 3.1 (L) 02/13/2021   ALBUMIN 3.1 (L) 01/31/2021   ALBUMIN 3.2 (L) 08/05/2020   PREALBUMIN 9.1 (L) 10/26/2011    Lab Results  Component Value Date   MG 2.1 01/31/2021   MG 1.7 05/20/2019   Lab Results  Component Value Date   VD25OH 27 (L) 03/28/2021   VD25OH 45 03/01/2011    Lab Results  Component Value Date   PREALBUMIN 9.1 (L) 10/26/2011      Latest Ref Rng & Units 03/28/2021    4:22 PM 02/13/2021   12:58 PM 02/13/2021   12:57 PM  CBC EXTENDED  WBC 3.8 - 10.8 Thousand/uL 7.3   4.9   RBC 3.80 - 5.10 Million/uL 2.48   2.29   Hemoglobin 11.7 - 15.5 g/dL 9.2   8.4   HCT 35.0 - 45.0 % 26.6  26.5  25.6   Platelets 140 - 400 Thousand/uL 132   135   NEUT# 1.7 - 7.7 K/uL   3.3   Lymph# 0.7 - 4.0 K/uL   1.2      There is no height or weight on file to calculate BMI.  Orders:  No orders of  the defined types were placed in this encounter.  No orders of the defined types were placed in this encounter.    Procedures: No procedures performed  Clinical Data: No additional findings.  ROS:  All other systems negative, except as noted in the HPI. Review of Systems  Objective: Vital Signs: LMP 04/14/2012   Specialty Comments:  No specialty comments available.  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   Past Medical History:  Diagnosis Date   Allergy    all rhinitis   Dependent edema    Dermatitis    GERD (gastroesophageal reflux disease)    Hypertension     Family History  Problem Relation Age of Onset   Hyperlipidemia Mother    Non-Hodgkin's lymphoma Father    Diabetes Maternal Grandmother    Colon cancer Neg Hx    Stomach cancer Neg Hx    Esophageal cancer Neg Hx     Colon polyps Neg Hx     Past Surgical History:  Procedure Laterality Date   CESAREAN SECTION     x2   TUBAL LIGATION     Social History   Occupational History   Occupation: Midwife  Tobacco Use   Smoking status: Former    Types: Cigarettes    Quit date: 08/29/1991    Years since quitting: 29.8   Smokeless tobacco: Never  Vaping Use   Vaping Use: Never used  Substance and Sexual Activity   Alcohol use: Yes    Comment: Patient drinks 2 40ox beers multiple days a week   Drug use: No   Sexual activity: Not on file

## 2021-06-13 ENCOUNTER — Other Ambulatory Visit: Payer: 59

## 2021-06-13 ENCOUNTER — Ambulatory Visit (HOSPITAL_BASED_OUTPATIENT_CLINIC_OR_DEPARTMENT_OTHER)
Admission: RE | Admit: 2021-06-13 | Discharge: 2021-06-13 | Disposition: A | Discharge status: Home / Self Care | Payer: 59 | Source: Ambulatory Visit | Attending: Internal Medicine | Admitting: Internal Medicine

## 2021-06-13 DIAGNOSIS — R7401 Elevation of levels of liver transaminase levels: Secondary | ICD-10-CM | POA: Insufficient documentation

## 2021-06-19 ENCOUNTER — Encounter: Payer: Self-pay | Admitting: Hematology

## 2021-06-19 LAB — PANCREATIC ELASTASE, FECAL: Pancreatic Elastase-1, Stool: <15 ug/g — ABNORMAL LOW

## 2021-06-20 ENCOUNTER — Other Ambulatory Visit: Payer: Self-pay

## 2021-06-20 ENCOUNTER — Encounter: Payer: Self-pay | Admitting: Hematology

## 2021-06-21 ENCOUNTER — Other Ambulatory Visit: Payer: Self-pay

## 2021-06-21 MED ORDER — CREON 24000-76000 UNITS PO CPEP: ORAL_CAPSULE | ORAL | 3 refills | Status: DC

## 2021-06-28 ENCOUNTER — Other Ambulatory Visit: Payer: Self-pay

## 2021-06-28 ENCOUNTER — Inpatient Hospital Stay: Payer: 59 | Attending: Hematology

## 2021-06-28 ENCOUNTER — Inpatient Hospital Stay (HOSPITAL_BASED_OUTPATIENT_CLINIC_OR_DEPARTMENT_OTHER): Payer: 59 | Admitting: Hematology

## 2021-06-28 VITALS — BP 105/77 | HR 75 | Temp 98.0°F | Resp 16 | Ht 67.0 in | Wt 150.9 lb

## 2021-06-28 DIAGNOSIS — D472 Monoclonal gammopathy: Secondary | ICD-10-CM | POA: Insufficient documentation

## 2021-06-28 DIAGNOSIS — Z9884 Bariatric surgery status: Secondary | ICD-10-CM | POA: Insufficient documentation

## 2021-06-28 DIAGNOSIS — K7460 Unspecified cirrhosis of liver: Secondary | ICD-10-CM

## 2021-06-28 DIAGNOSIS — M7989 Other specified soft tissue disorders: Secondary | ICD-10-CM

## 2021-06-28 DIAGNOSIS — D508 Other iron deficiency anemias: Secondary | ICD-10-CM | POA: Diagnosis not present

## 2021-06-28 DIAGNOSIS — E538 Deficiency of other specified B group vitamins: Secondary | ICD-10-CM | POA: Insufficient documentation

## 2021-06-28 DIAGNOSIS — Z87891 Personal history of nicotine dependence: Secondary | ICD-10-CM | POA: Insufficient documentation

## 2021-06-28 DIAGNOSIS — D649 Anemia, unspecified: Secondary | ICD-10-CM | POA: Insufficient documentation

## 2021-06-28 DIAGNOSIS — R718 Other abnormality of red blood cells: Secondary | ICD-10-CM

## 2021-06-28 LAB — CBC WITH DIFFERENTIAL/PLATELET
Abs Immature Granulocytes: 0.02 10*3/uL (ref 0.00–0.07)
Basophils Absolute: 0 10*3/uL (ref 0.0–0.1)
Basophils Relative: 1 %
Eosinophils Absolute: 0 10*3/uL (ref 0.0–0.5)
Eosinophils Relative: 1 %
HCT: 23.7 % — ABNORMAL LOW (ref 36.0–46.0)
Hemoglobin: 7.8 g/dL — ABNORMAL LOW (ref 12.0–15.0)
Immature Granulocytes: 0 %
Lymphocytes Relative: 21 %
Lymphs Abs: 1.3 10*3/uL (ref 0.7–4.0)
MCH: 36.4 pg — ABNORMAL HIGH (ref 26.0–34.0)
MCHC: 32.9 g/dL (ref 30.0–36.0)
MCV: 110.7 fL — ABNORMAL HIGH (ref 80.0–100.0)
Monocytes Absolute: 0.5 10*3/uL (ref 0.1–1.0)
Monocytes Relative: 8 %
Neutro Abs: 4.1 10*3/uL (ref 1.7–7.7)
Neutrophils Relative %: 69 %
Platelets: 113 10*3/uL — ABNORMAL LOW (ref 150–400)
RBC: 2.14 MIL/uL — ABNORMAL LOW (ref 3.87–5.11)
RDW: 15.9 % — ABNORMAL HIGH (ref 11.5–15.5)
WBC: 5.9 10*3/uL (ref 4.0–10.5)
nRBC: 0 % (ref 0.0–0.2)

## 2021-06-28 LAB — CMP (CANCER CENTER ONLY)
ALT: 75 U/L — ABNORMAL HIGH (ref 0–44)
AST: 140 U/L — ABNORMAL HIGH (ref 15–41)
Albumin: 2.9 g/dL — ABNORMAL LOW (ref 3.5–5.0)
Alkaline Phosphatase: 73 U/L (ref 38–126)
Anion gap: 7 (ref 5–15)
BUN: 10 mg/dL (ref 8–23)
CO2: 19 mmol/L — ABNORMAL LOW (ref 22–32)
Calcium: 8.4 mg/dL — ABNORMAL LOW (ref 8.9–10.3)
Chloride: 111 mmol/L (ref 98–111)
Creatinine: 0.97 mg/dL (ref 0.44–1.00)
GFR, Estimated: >60 mL/min (ref >60)
Glucose, Bld: 98 mg/dL (ref 70–99)
Potassium: 3.8 mmol/L (ref 3.5–5.1)
Sodium: 137 mmol/L (ref 135–145)
Total Bilirubin: 0.9 mg/dL (ref 0.3–1.2)
Total Protein: 6.1 g/dL — ABNORMAL LOW (ref 6.5–8.1)

## 2021-06-28 LAB — IRON AND IRON BINDING CAPACITY (CC-WL,HP ONLY)
Iron: 141 ug/dL (ref 28–170)
Saturation Ratios: 85 % — ABNORMAL HIGH (ref 10.4–31.8)
TIBC: 167 ug/dL — ABNORMAL LOW (ref 250–450)
UIBC: 26 ug/dL — ABNORMAL LOW (ref 148–442)

## 2021-06-28 LAB — VITAMIN B12: Vitamin B-12: 7103 pg/mL — ABNORMAL HIGH (ref 180–914)

## 2021-06-28 LAB — FERRITIN: Ferritin: 346 ng/mL — ABNORMAL HIGH (ref 11–307)

## 2021-06-29 LAB — ERYTHROPOIETIN: Erythropoietin: 118.8 m[IU]/mL — ABNORMAL HIGH (ref 2.6–18.5)

## 2021-06-30 ENCOUNTER — Telehealth: Payer: Self-pay | Admitting: Hematology

## 2021-06-30 LAB — KAPPA/LAMBDA LIGHT CHAINS
Kappa free light chain: 115.3 mg/L — ABNORMAL HIGH (ref 3.3–19.4)
Kappa, lambda light chain ratio: 3.06 — ABNORMAL HIGH (ref 0.26–1.65)
Lambda free light chains: 37.7 mg/L — ABNORMAL HIGH (ref 5.7–26.3)

## 2021-06-30 NOTE — Telephone Encounter (Signed)
Scheduled follow-up appointments per 6/28 los. Patient is aware. 

## 2021-07-04 LAB — MULTIPLE MYELOMA PANEL, SERUM
Albumin SerPl Elph-Mcnc: 2.9 g/dL (ref 2.9–4.4)
Albumin/Glob SerPl: 1 (ref 0.7–1.7)
Alpha 1: 0.2 g/dL (ref 0.0–0.4)
Alpha2 Glob SerPl Elph-Mcnc: 0.6 g/dL (ref 0.4–1.0)
B-Globulin SerPl Elph-Mcnc: 0.5 g/dL — ABNORMAL LOW (ref 0.7–1.3)
Gamma Glob SerPl Elph-Mcnc: 1.8 g/dL (ref 0.4–1.8)
Globulin, Total: 3.1 g/dL (ref 2.2–3.9)
IgA: 58 mg/dL — ABNORMAL LOW (ref 87–352)
IgG (Immunoglobin G), Serum: 1706 mg/dL — ABNORMAL HIGH (ref 586–1602)
IgM (Immunoglobulin M), Srm: 404 mg/dL — ABNORMAL HIGH (ref 26–217)
M Protein SerPl Elph-Mcnc: 1.1 g/dL — ABNORMAL HIGH
Total Protein ELP: 6 g/dL (ref 6.0–8.5)

## 2021-07-04 NOTE — Progress Notes (Signed)
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 female who has been referred to [place] by Dr Lucillie Garfinkel for evaluation and management of anemia. The pt reports that she is doing well overall.  The pt reports that she was first given the diagnoses of anemia about 10 years ago. Pt is currently taking 1 iron pill every other day. Pt had a gastric bypass surgery in 2014. She initially weighed about 400 lbs and is currently 164 lbs. She has been experiencing a severe lack of appetite and weight loss. When she does attempt to eat she can only take a couple of bites and then has a bowel movement soon after that. She also has abdominal pain after she eats. Pt has seasonal allergies and eczema, for which she has been taking Dupixent for 3 months. Pt has never had a skin biopsy to confirm her eczema diagnosis. It was dormant for about 10 years and came back 2 years ago after an interaction with a cat and has persisted. Pt also takes Vistaril to help with the itch. Pt has never had an allergy test or immune therapy testing. Pt took steroid shots for her carpal tunnel. There were pre-cancerous cells found in pt's pap smear and her Gynecologist is planning on doing a resection. Pt was experiencing black stools a couple of months ago, that were not painful and did not speak with her PCP about it. She had a colonoscopy in 2014 and has never had an endoscopy. Pt believes her ankle swelling is from work and wears compression socks which helps.   Most recent lab results (08/12/2018) of CBC is as follows: WBC at 3.8K, RBC at 2.49, Hgb at 7.8, HCT at 24.3, MCV at 98, MCH at 31.3, MCHC at 32.1, RDW at 15.9, Platelets at 182K.  08/12/2018 Transferrin is  286 08/12/2018 Ferritin is 30  08/12/2018 Iron and TIBC shows Iron Bind, Cap, (TIBC) at 349, UIBC at 303, Iron at 46, Iron Sat at 13.   On review of systems, pt reports pain in her left knee, weight loss, fatigue, enlarged cervical lymph nodes, black/bloody stools, abdominal pain and denies vaginal bleeds, nose bleeds gum bleeds, chest pain, SOB, fevers, chills and any other symptoms.   On PMHx the pt reports gastric bypass surgery, two cesarean sections, tubal ligation.  On Social Hx the pt reports that she has quit smoking, no alcohol use.   INTERVAL HISTORY:   Debra Jacobson is here for follow-up after a couple of missed appointments.  She notes some increased fatigue and also increased bilateral lower extremity swelling.. She notes some minimal blood on tissue paper while wiping. No new bone pains. She is put on about 11 pounds in the last couple of months likely due to fluid retention. No overt chest pain or shortness of breath. Patient notes that she is getting help from her substance abuse and has been trying avoid alcohol.  She is not currently working till she is cleared by the [employer]. She has been seen by gastroenterology for diarrhea and has been started on pancreatic enzyme supplements for concern for pancreatic exocrine insufficiency Labs done today were discussed in detail with her.   MEDICAL HISTORY:  Past Medical History:  Diagnosis  Date   Allergy    all rhinitis   Dependent edema    Dermatitis    GERD (gastroesophageal reflux disease)    Hypertension     SURGICAL HISTORY: Past Surgical History:  Procedure Laterality Date   CESAREAN SECTION     x2   TUBAL LIGATION      SOCIAL HISTORY: Social History   Socioeconomic History   Marital status: Married    Spouse name: Not on file   Number of children: 3   Years of education: Not on file   Highest education level: Not on file  Occupational History   Occupation: Travel nurse  Tobacco  Use   Smoking status: Former    Types: Cigarettes    Quit date: 08/29/1991    Years since quitting: 29.8   Smokeless tobacco: Never  Vaping Use   Vaping Use: Never used  Substance and Sexual Activity   Alcohol use: Yes    Comment: Patient drinks 2 40ox beers multiple days a week   Drug use: No   Sexual activity: Not on file  Other Topics Concern   Not on file  Social History Narrative   Not on file   Social Determinants of Health   Financial Resource Strain: Not on file  Food Insecurity: Not on file  Transportation Needs: Not on file  Physical Activity: Not on file  Stress: Not on file  Social Connections: Not on file  Intimate Partner Violence: Not on file    FAMILY HISTORY: Family History  Problem Relation Age of Onset   Hyperlipidemia Mother    Non-Hodgkin's lymphoma Father    Diabetes Maternal Grandmother    Colon cancer Neg Hx    Stomach cancer Neg Hx    Esophageal cancer Neg Hx    Colon polyps Neg Hx     ALLERGIES:  has No Known Allergies.  MEDICATIONS:  Current Outpatient Medications  Medication Sig Dispense Refill   cyanocobalamin (,VITAMIN B-12,) 1000 MCG/ML injection INJECT 1 MLS EVERY WEEK FOR 4 WEEKS THEN INJECT 1 ML MONTHLY THEREAFTER 6 mL 1   meloxicam (MOBIC) 15 MG tablet Take 15 mg by mouth daily.     methocarbamol (ROBAXIN) 500 MG tablet Take 500 mg by mouth 2 (two) times daily.     Pancrelipase, Lip-Prot-Amyl, (CREON) 24000-76000 units CPEP Take 2 capsules (48,000 Units total) by mouth with breakfast, with lunch, and with evening meal AND 1 capsule (24,000 Units total) with snacks. 180 capsule 3   Vitamin D, Ergocalciferol, (DRISDOL) 1.25 MG (50000 UNIT) CAPS capsule TAKE ONE CAPSULE BY MOUTH WEEKLY 12 capsule 4   DUPIXENT 300 MG/2ML prefilled syringe Inject 300 mg into the skin every 14 (fourteen) days. (Patient not taking: Reported on 0/94/7096)     folic acid (FOLVITE) 1 MG tablet Take 1 tablet (1 mg total) by mouth daily. (Patient not taking:  Reported on 05/25/2021) 30 tablet 1   hydrOXYzine (VISTARIL) 25 MG capsule Take 1-2 capsules (25-50 mg total) by mouth at bedtime as needed. (Patient not taking: Reported on 05/25/2021) 30 capsule 1   thiamine 100 MG tablet Take 1 tablet (100 mg total) by mouth daily. (Patient not taking: Reported on 05/25/2021) 30 tablet 1   triamcinolone cream (KENALOG) 0.1 % Apply 1 application topically 4 (four) times daily as needed. (Patient not taking: Reported on 05/25/2021) 30 g 0   No current facility-administered medications for this visit.    REVIEW OF SYSTEMS:   10 Point review of Systems was done  is negative except as noted above.  PHYSICAL EXAMINATION: ECOG PERFORMANCE STATUS: 1 - Symptomatic but completely ambulatory  . Vitals:   06/28/21 1202  BP: 105/77  Pulse: 75  Resp: 16  Temp: 98 F (36.7 C)  SpO2: 100%    Filed Weights   06/28/21 1202  Weight: 150 lb 14.4 oz (68.4 kg)    .Body mass index is 23.63 kg/m.  NAD GENERAL:alert, in no acute distress and comfortable SKIN: no acute rashes, no significant lesions EYES: conjunctiva are pink and non-injected, sclera anicteric OROPHARYNX: MMM, no exudates, no oropharyngeal erythema or ulceration NECK: supple, no JVD LYMPH:  no palpable lymphadenopathy in the cervical, axillary or inguinal regions LUNGS: clear to auscultation b/l with normal respiratory effort HEART: regular rate & rhythm ABDOMEN:  normoactive bowel sounds , non tender, not distended. Extremity: Bilateral 2+ pitting pedal edema PSYCH: alert & oriented x 3 with fluent speech NEURO: no focal motor/sensory deficits   LABORATORY DATA:  I have reviewed the data as listed  .    Latest Ref Rng & Units 06/28/2021   11:29 AM 03/28/2021    4:22 PM 02/13/2021   12:58 PM  CBC  WBC 4.0 - 10.5 K/uL 5.9  7.3    Hemoglobin 12.0 - 15.0 g/dL 7.8  9.2    Hematocrit 36.0 - 46.0 % 23.7  26.6  26.5   Platelets 150 - 400 K/uL 113  132     . CBC    Component Value  Date/Time   WBC 5.9 06/28/2021 1129   RBC 2.14 (L) 06/28/2021 1129   HGB 7.8 (L) 06/28/2021 1129   HGB 8.4 (L) 02/13/2021 1257   HCT 23.7 (L) 06/28/2021 1129   HCT 26.5 (L) 02/13/2021 1258   PLT 113 (L) 06/28/2021 1129   PLT 135 (L) 02/13/2021 1257   MCV 110.7 (H) 06/28/2021 1129   MCH 36.4 (H) 06/28/2021 1129   MCHC 32.9 06/28/2021 1129   RDW 15.9 (H) 06/28/2021 1129   LYMPHSABS 1.3 06/28/2021 1129   MONOABS 0.5 06/28/2021 1129   EOSABS 0.0 06/28/2021 1129   BASOSABS 0.0 06/28/2021 1129    .    Latest Ref Rng & Units 06/28/2021   11:29 AM 03/28/2021    4:22 PM 02/13/2021   12:57 PM  CMP  Glucose 70 - 99 mg/dL 98  85  93   BUN 8 - 23 mg/dL _0 Creatinine 0.44 - 1.00 mg/dL 0.97  0.94  1.05   Sodium 135 - 145 mmol/L 137  145  142   Potassium 3.5 - 5.1 mmol/L 3.8  4.6  5.0   Chloride 98 - 111 mmol/L 111  117  115   CO2 22 - 32 mmol/L _1 Calcium 8.9 - 10.3 mg/dL 8.4  8.6  8.4   Total Protein 6.5 - 8.1 g/dL 6.1  6.8  6.3   Total Bilirubin 0.3 - 1.2 mg/dL 0.9  0.4  0.4   Alkaline Phos 38 - 126 U/L 73   72   AST 15 - 41 U/L 140  108  139   ALT 0 - 44 U/L 75  79  88    . Lab Results  Component Value Date   IRON 141 06/28/2021   TIBC 167 (L) 06/28/2021   IRONPCTSAT 85 (H) 06/28/2021   (Iron and TIBC)  Lab Results  Component Value Date   FERRITIN 346 (H) 06/28/2021   09/22/2018 Bone  Marrow Report   09/22/2018 Bone Marrow report    09/22/2018 FISH Analysis    09/22/2018 Cytogenetics   RADIOGRAPHIC STUDIES: I have personally reviewed the radiological images as listed and agreed with the findings in the report. US Abdomen Complete  Result Date: 06/13/2021 CLINICAL DATA:  Elevated transaminase level. EXAM: ABDOMEN ULTRASOUND COMPLETE COMPARISON:  None Available. FINDINGS: Gallbladder: Mild wall thickening. Gallbladder polyp measuring 7 mm. No gallstones. Negative sonographic Murphy sign. Common bile duct: Diameter: 7 mm Liver: Increased  echogenicity. No focal lesion. Portal vein is patent on color Doppler imaging with normal direction of blood flow towards the liver. IVC: No abnormality visualized. Pancreas: Visualized portion unremarkable. Spleen: Size and appearance within normal limits. Right Kidney: Length: 10.1 cm. Echogenicity within normal limits. No mass or hydronephrosis visualized. Left Kidney: Length: 9.8 cm. Echogenicity within normal limits. No mass or hydronephrosis visualized. Abdominal aorta: No aneurysm visualized. Other findings: Trace there is a small volume ascites. Right pleural effusion. IMPRESSION: Mild gallbladder wall thickening, indeterminate etiology. Small volume ascites. Gallbladder polyp measuring 7 mm. Recommend follow-up right upper quadrant ultrasound in 12 months. Increased hepatic parenchymal echogenicity suggestive of steatosis. Electronically Signed   By: Lovey Newcomer M.D.   On: 06/13/2021 21:48    ASSESSMENT & PLAN:   1) significant macrocytic anemia likely multifactorial but significant concern for liver cirrhosis and alcohol abuse.  Other factors Vitamin B12 deficiency and other potential nutritional deficiencyes caused by Gastric bypass surgery . Also had dyserythropoiesis on BM Bx.  Currently active significant alcohol abuse and liver disease are also significant contributing factors. 2) Iron deficiency- GI blood loss vs poor absorption related to Gastric bypass surgery. 09/17/2018 Occult blood card to lab is "POSITIVE" x2.  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  Recent admission with confusion and hepatic encephalopathy. 6) noncompliance with medical follow-up  PLAN: Patient's labs done today were discussed in detail with her CBC shows significant anemia with a drop in hemoglobin to 7.8 MCV 110 platelet count of 113k and WBC count of 5.9k Patient has declined PRBC transfusions completely due to her faith is Jehovah's Witness and would not want PRBC transfusions even under life-threatening situations. We discussed and patient is agreeable with IV iron and Retacrit shots. Ultrasound lower extremities to rule out DVT Ultrasound abdomen with elastography to evaluate for liver cirrhosis as an etiology for the patient's anemia and thrombocytopenia B12 levels adequate continue current B12 replacement Continue vitamin B complex Myeloma panel shows stable M spike of 1.1 g/dL which is similar to about a year ago and does not show any overt signs of myeloma progression. She will continue to follow with her gastroenterologist for management of alcohol-related liver disease and her chronic diarrhea. 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 -Counseled on need for on absolute alcohol cessation   FOLLOW UP:  Ultrasound venous bilateral lower extremities to rule out DVT soon as possible Ultrasound with elastography to evaluate for liver cirrhosis in 1  week CT bone marrow aspiration and biopsy in 1 to 2 weeks Please schedule for IV Venofer 300 mg weekly x3 doses Please schedule for Retacrit 10,000 units every 2 weeks with labs Return to clinic with Dr. Irene Limbo in 3 to 4 weeks   The total time spent in the appointment was 45 minutes*.  All of the patient's questions were answered with apparent satisfaction. The patient knows to  call the clinic with any problems, questions or concerns.   Sullivan Lone MD MS AAHIVMS [facility] Hematology/Oncology Physician [facility]  .*Total Encounter Time as defined by the Centers for Medicare and Medicaid Services includes, in addition to the face-to-face time of a patient visit (documented in the note above) non-face-to-face time: obtaining and reviewing outside history, ordering and reviewing medications, tests or procedures, care coordination (communications with other health care professionals or caregivers) and documentation in the medical record.

## 2021-07-04 NOTE — Progress Notes (Incomplete)
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 female who has been referred to [place] by Dr Debra Jacobson for evaluation and management of anemia. The pt reports that she is doing well overall.  The pt reports that she was first given the diagnoses of anemia about 10 years ago. Pt is currently taking 1 iron pill every other day. Pt had a gastric bypass surgery in 2014. She initially weighed about 400 lbs and is currently 164 lbs. She has been experiencing a severe lack of appetite and weight loss. When she does attempt to eat she can only take a couple of bites and then has a bowel movement soon after that. She also has abdominal pain after she eats. Pt has seasonal allergies and eczema, for which she has been taking Dupixent for 3 months. Pt has never had a skin biopsy to confirm her eczema diagnosis. It was dormant for about 10 years and came back 2 years ago after an interaction with a cat and has persisted. Pt also takes Vistaril to help with the itch. Pt has never had an allergy test or immune therapy testing. Pt took steroid shots for her carpal tunnel. There were pre-cancerous cells found in pt's pap smear and her Gynecologist is planning on doing a resection. Pt was experiencing black stools a couple of months ago, that were not painful and did not speak with her PCP about it. She had a colonoscopy in 2014 and has never had an endoscopy. Pt believes her ankle swelling is from work and wears compression socks which helps.   Most recent lab results (08/12/2018) of CBC is as follows: WBC at 3.8K, RBC at 2.49, Hgb at 7.8, HCT at 24.3, MCV at 98, MCH at 31.3, MCHC at 32.1, RDW at 15.9, Platelets at 182K.  08/12/2018 Transferrin is  286 08/12/2018 Ferritin is 30  08/12/2018 Iron and TIBC shows Iron Bind, Cap, (TIBC) at 349, UIBC at 303, Iron at 46, Iron Sat at 13.   On review of systems, pt reports pain in her left knee, weight loss, fatigue, enlarged cervical lymph nodes, black/bloody stools, abdominal pain and denies vaginal bleeds, nose bleeds gum bleeds, chest pain, SOB, fevers, chills and any other symptoms.   On PMHx the pt reports gastric bypass surgery, two cesarean sections, tubal ligation.  On Social Hx the pt reports that she has quit smoking, no alcohol use.   INTERVAL HISTORY:   Debra Jacobson  MEDICAL HISTORY:  Past Medical History:  Diagnosis Date  . Allergy    all rhinitis  . Dependent edema   . Dermatitis   . GERD (gastroesophageal reflux disease)   . Hypertension     SURGICAL HISTORY: Past Surgical History:  Procedure Laterality Date  . CESAREAN SECTION     x2  . TUBAL LIGATION      SOCIAL HISTORY: Social History   Socioeconomic History  . Marital status: Married    Spouse name: Not on file  . Number of children: 3  . Years of education: Not on file  . Highest education level: Not on file  Occupational History  . Occupation: Arts administrator  Tobacco Use  . Smoking status: Former    Types: Cigarettes    Quit date:  08/29/1991    Years since quitting: 29.8  . Smokeless tobacco: Never  Vaping Use  . Vaping Use: Never used  Substance and Sexual Activity  . Alcohol use: Yes    Comment: Patient drinks 2 40ox beers multiple days a week  . Drug use: No  . Sexual activity: Not on file  Other Topics Concern  . Not on file  Social History Narrative  . Not on file   Social Determinants of Health   Financial Resource Strain: Not on file  Food Insecurity: Not on file  Transportation Needs: Not on file  Physical Activity: Not on file  Stress: Not on file  Social Connections: Not on file  Intimate Partner Violence: Not on file    FAMILY HISTORY: Family History  Problem  Relation Age of Onset  . Hyperlipidemia Mother   . Non-Hodgkin's lymphoma Father   . Diabetes Maternal Grandmother   . Colon cancer Neg Hx   . Stomach cancer Neg Hx   . Esophageal cancer Neg Hx   . Colon polyps Neg Hx     ALLERGIES:  has No Known Allergies.  MEDICATIONS:  Current Outpatient Medications  Medication Sig Dispense Refill  . cyanocobalamin (,VITAMIN B-12,) 1000 MCG/ML injection INJECT 1 MLS EVERY WEEK FOR 4 WEEKS THEN INJECT 1 ML MONTHLY THEREAFTER 6 mL 1  . meloxicam (MOBIC) 15 MG tablet Take 15 mg by mouth daily.    . methocarbamol (ROBAXIN) 500 MG tablet Take 500 mg by mouth 2 (two) times daily.    . Pancrelipase, Lip-Prot-Amyl, (CREON) 24000-76000 units CPEP Take 2 capsules (48,000 Units total) by mouth with breakfast, with lunch, and with evening meal AND 1 capsule (24,000 Units total) with snacks. 180 capsule 3  . Vitamin D, Ergocalciferol, (DRISDOL) 1.25 MG (50000 UNIT) CAPS capsule TAKE ONE CAPSULE BY MOUTH WEEKLY 12 capsule 4  . DUPIXENT 300 MG/2ML prefilled syringe Inject 300 mg into the skin every 14 (fourteen) days. (Patient not taking: Reported on 05/25/2021)    . folic acid (FOLVITE) 1 MG tablet Take 1 tablet (1 mg total) by mouth daily. (Patient not taking: Reported on 05/25/2021) 30 tablet 1  . hydrOXYzine (VISTARIL) 25 MG capsule Take 1-2 capsules (25-50 mg total) by mouth at bedtime as needed. (Patient not taking: Reported on 05/25/2021) 30 capsule 1  . thiamine 100 MG tablet Take 1 tablet (100 mg total) by mouth daily. (Patient not taking: Reported on 05/25/2021) 30 tablet 1  . triamcinolone cream (KENALOG) 0.1 % Apply 1 application topically 4 (four) times daily as needed. (Patient not taking: Reported on 05/25/2021) 30 g 0   No current facility-administered medications for this visit.    REVIEW OF SYSTEMS:   10 Point review of Systems was done is negative except as noted above.  PHYSICAL EXAMINATION: ECOG PERFORMANCE STATUS: 1 - Symptomatic but completely  ambulatory  . Vitals:   06/28/21 1202  BP: 105/77  Pulse: 75  Resp: 16  Temp: 98 F (36.7 C)  SpO2: 100%    Filed Weights   06/28/21 1202  Weight: 150 lb 14.4 oz (68.4 kg)    .Body mass index is 23.63 kg/m.  NAD GENERAL:alert, in no acute distress and comfortable SKIN: no acute rashes, no significant lesions EYES: conjunctiva are pink and non-injected, sclera anicteric OROPHARYNX: MMM, no exudates, no oropharyngeal erythema or ulceration NECK: supple, no JVD LYMPH:  no palpable lymphadenopathy in the cervical, axillary or inguinal regions LUNGS: clear to auscultation b/l with normal respiratory effort HEART:  regular rate & rhythm ABDOMEN:  normoactive bowel sounds , non tender, not distended. Extremity: rt LE pedal edema PSYCH: alert & oriented x 3 with fluent speech NEURO: no focal motor/sensory deficits   LABORATORY DATA:  I have reviewed the data as listed  .    Latest Ref Rng & Units 06/28/2021   11:29 AM 03/28/2021    4:22 PM 02/13/2021   12:58 PM  CBC  WBC 4.0 - 10.5 K/uL 5.9  7.3    Hemoglobin 12.0 - 15.0 g/dL 7.8  9.2    Hematocrit 36.0 - 46.0 % 23.7  26.6  26.5   Platelets 150 - 400 K/uL 113  132     . CBC    Component Value Date/Time   WBC 5.9 06/28/2021 1129   RBC 2.14 (L) 06/28/2021 1129   HGB 7.8 (L) 06/28/2021 1129   HGB 8.4 (L) 02/13/2021 1257   HCT 23.7 (L) 06/28/2021 1129   HCT 26.5 (L) 02/13/2021 1258   PLT 113 (L) 06/28/2021 1129   PLT 135 (L) 02/13/2021 1257   MCV 110.7 (H) 06/28/2021 1129   MCH 36.4 (H) 06/28/2021 1129   MCHC 32.9 06/28/2021 1129   RDW 15.9 (H) 06/28/2021 1129   LYMPHSABS 1.3 06/28/2021 1129   MONOABS 0.5 06/28/2021 1129   EOSABS 0.0 06/28/2021 1129   BASOSABS 0.0 06/28/2021 1129    .    Latest Ref Rng & Units 06/28/2021   11:29 AM 03/28/2021    4:22 PM 02/13/2021   12:57 PM  CMP  Glucose 70 - 99 mg/dL 98  85  93   BUN 8 - 23 mg/dL $Remove'10  15  7   'qyZEAJS$ Creatinine 0.44 - 1.00 mg/dL 0.97  0.94  1.05   Sodium 135 -  145 mmol/L 137  145  142   Potassium 3.5 - 5.1 mmol/L 3.8  4.6  5.0   Chloride 98 - 111 mmol/L 111  117  115   CO2 22 - 32 mmol/L $RemoveB'19  15  24   'EhMKioFO$ Calcium 8.9 - 10.3 mg/dL 8.4  8.6  8.4   Total Protein 6.5 - 8.1 g/dL 6.1  6.8  6.3   Total Bilirubin 0.3 - 1.2 mg/dL 0.9  0.4  0.4   Alkaline Phos 38 - 126 U/L 73   72   AST 15 - 41 U/L 140  108  139   ALT 0 - 44 U/L 75  79  88    . Lab Results  Component Value Date   IRON 141 06/28/2021   TIBC 167 (L) 06/28/2021   IRONPCTSAT 85 (H) 06/28/2021   (Iron and TIBC)  Lab Results  Component Value Date   FERRITIN 346 (H) 06/28/2021   09/22/2018 Bone Marrow Report   09/22/2018 Bone Marrow report    09/22/2018 FISH Analysis    09/22/2018 Cytogenetics   RADIOGRAPHIC STUDIES: I have personally reviewed the radiological images as listed and agreed with the findings in the report. US Abdomen Complete  Result Date: 06/13/2021 CLINICAL DATA:  Elevated transaminase level. EXAM: ABDOMEN ULTRASOUND COMPLETE COMPARISON:  None Available. FINDINGS: Gallbladder: Mild wall thickening. Gallbladder polyp measuring 7 mm. No gallstones. Negative sonographic Murphy sign. Common bile duct: Diameter: 7 mm Liver: Increased echogenicity. No focal lesion. Portal vein is patent on color Doppler imaging with normal direction of blood flow towards the liver. IVC: No abnormality visualized. Pancreas: Visualized portion unremarkable. Spleen: Size and appearance within normal limits. Right Kidney: Length: 10.1 cm. Echogenicity within normal  limits. No mass or hydronephrosis visualized. Left Kidney: Length: 9.8 cm. Echogenicity within normal limits. No mass or hydronephrosis visualized. Abdominal aorta: No aneurysm visualized. Other findings: Trace there is a small volume ascites. Right pleural effusion. IMPRESSION: Mild gallbladder wall thickening, indeterminate etiology. Small volume ascites. Gallbladder polyp measuring 7 mm. Recommend follow-up right upper quadrant  ultrasound in 12 months. Increased hepatic parenchymal echogenicity suggestive of steatosis. Electronically Signed   By: Lovey Newcomer M.D.   On: 06/13/2021 21:48    ASSESSMENT & PLAN:   1) RBC Macrocytosis, likely due to Vitamin B12 deficiency and other potential nutritional deficiencyes caused by Gastric bypass surgery . Also had dyserythropoiesis on BM Bx.  Currently active significant alcohol abuse and liver disease are also significant contributing factors. 2) Iron deficiency- GI blood loss vs poor absorption related to Gastric bypass surgery. 09/17/2018 Occult blood card to lab is "POSITIVE" x2.  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  Recent admission with confusion and hepatic encephalopathy. 6) noncompliance with medical follow-up  PLAN: She notes she has had episodes of confusion and was last in the emergency room on 01/31/2021.  Her confusion was thought to be possible hepatic encephalopathy from alcohol abuse. Patient does endorse continued heavy alcohol use. She was recommended complete alcohol abstinence and also offered help with referral needing rehabilitation facility which she wanted to hold off currently. 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 She was also noted that she should talk to her primary care physician and if she is having continued confusion, continue current use she  should not not be working as a Marine scientist.  Labs done today show hemoglobin of 8.4 with an MCV of 111.8, WBC count of 4.9k platelets of 125k CMP creatinine of 1.05 AST elevated at 139 ALT 88 (has previously had levels as high as 857 and 441 respectively) likely due to alcohol abuse. Ferritin 243 with an iron saturation of 45% Folic acid within normal limits B12 1100 (was previously as low as 139) Myeloma panel shows an M spike of 0.8 g/dL which is down from her previous high of 1.5 g/dL. -No evidence of progression to multiple myeloma at this time -We discussed that her anemia is primarily driven by her heavy alcohol abuse and alcohol-related liver disease. -B12 levels are stable and she should continue her current B12 replacement at 1000 mcg daily -No clear indication for IV iron at this time -If the patient indulges in alcohol cessation and is still anemic with hemoglobin less than 9 will need to consider erythropoietin  FOLLOW UP: Close follow-up with primary care physician Return to clinic with Dr. Irene Limbo with labs in 2 months  The total time spent in the appt was 40 minutes and more than 50% was on counseling and direct patient cares.  All of the patient's questions were answered with apparent satisfaction. The patient knows to call the clinic with any problems, questions or concerns.    Sullivan Lone MD [place] AAHIVMS [facility] Hematology/Oncology Physician [facility]

## 2021-07-05 ENCOUNTER — Encounter: Payer: Self-pay | Admitting: Internal Medicine

## 2021-07-05 ENCOUNTER — Encounter: Payer: Self-pay | Admitting: Hematology

## 2021-07-06 ENCOUNTER — Encounter: Payer: Self-pay | Admitting: Hematology

## 2021-07-06 ENCOUNTER — Ambulatory Visit (HOSPITAL_COMMUNITY)
Admission: RE | Admit: 2021-07-06 | Discharge: 2021-07-06 | Disposition: A | Discharge status: Home / Self Care | Payer: Commercial Managed Care - HMO | Source: Ambulatory Visit | Attending: Hematology | Admitting: Hematology

## 2021-07-06 DIAGNOSIS — M7989 Other specified soft tissue disorders: Secondary | ICD-10-CM | POA: Diagnosis present

## 2021-07-06 NOTE — Telephone Encounter (Signed)
Let patient know that I have not specifically received information from oncology but if she continues to have nausea and vomiting upper endoscopy is reasonable. She had colonoscopy recently in the [place]. If persistent symptoms she should avoid alcohol If persistent symptoms EGD can be scheduled in the [facility]

## 2021-07-10 ENCOUNTER — Inpatient Hospital Stay: Payer: Commercial Managed Care - HMO | Attending: Hematology

## 2021-07-10 ENCOUNTER — Other Ambulatory Visit: Payer: Self-pay | Admitting: Hematology

## 2021-07-10 ENCOUNTER — Other Ambulatory Visit: Payer: Self-pay

## 2021-07-10 ENCOUNTER — Inpatient Hospital Stay: Payer: Commercial Managed Care - HMO

## 2021-07-10 ENCOUNTER — Ambulatory Visit (HOSPITAL_COMMUNITY)
Admission: RE | Admit: 2021-07-10 | Discharge: 2021-07-10 | Disposition: A | Discharge status: Home / Self Care | Payer: Commercial Managed Care - HMO | Source: Ambulatory Visit | Attending: Hematology | Admitting: Hematology

## 2021-07-10 ENCOUNTER — Telehealth: Payer: Self-pay | Admitting: Hematology

## 2021-07-10 ENCOUNTER — Encounter: Payer: Self-pay | Admitting: Orthopedic Surgery

## 2021-07-10 ENCOUNTER — Other Ambulatory Visit: Payer: Self-pay | Admitting: Hematology and Oncology

## 2021-07-10 VITALS — BP 112/80 | HR 56 | Temp 98.0°F | Resp 16

## 2021-07-10 DIAGNOSIS — D508 Other iron deficiency anemias: Secondary | ICD-10-CM | POA: Insufficient documentation

## 2021-07-10 DIAGNOSIS — D472 Monoclonal gammopathy: Secondary | ICD-10-CM

## 2021-07-10 DIAGNOSIS — E538 Deficiency of other specified B group vitamins: Secondary | ICD-10-CM | POA: Insufficient documentation

## 2021-07-10 DIAGNOSIS — K7460 Unspecified cirrhosis of liver: Secondary | ICD-10-CM | POA: Insufficient documentation

## 2021-07-10 DIAGNOSIS — K911 Postgastric surgery syndromes: Secondary | ICD-10-CM | POA: Insufficient documentation

## 2021-07-10 LAB — CBC WITH DIFFERENTIAL (CANCER CENTER ONLY)
Abs Immature Granulocytes: 0.02 10*3/uL (ref 0.00–0.07)
Basophils Absolute: 0 10*3/uL (ref 0.0–0.1)
Basophils Relative: 1 %
Eosinophils Absolute: 0 10*3/uL (ref 0.0–0.5)
Eosinophils Relative: 1 %
HCT: 24.8 % — ABNORMAL LOW (ref 36.0–46.0)
Hemoglobin: 7.9 g/dL — ABNORMAL LOW (ref 12.0–15.0)
Immature Granulocytes: 0 %
Lymphocytes Relative: 25 %
Lymphs Abs: 1.5 10*3/uL (ref 0.7–4.0)
MCH: 36.7 pg — ABNORMAL HIGH (ref 26.0–34.0)
MCHC: 31.9 g/dL (ref 30.0–36.0)
MCV: 115.3 fL — ABNORMAL HIGH (ref 80.0–100.0)
Monocytes Absolute: 0.6 10*3/uL (ref 0.1–1.0)
Monocytes Relative: 9 %
Neutro Abs: 4 10*3/uL (ref 1.7–7.7)
Neutrophils Relative %: 64 %
Platelet Count: 202 10*3/uL (ref 150–400)
RBC: 2.15 MIL/uL — ABNORMAL LOW (ref 3.87–5.11)
RDW: 17 % — ABNORMAL HIGH (ref 11.5–15.5)
WBC Count: 6.1 10*3/uL (ref 4.0–10.5)
nRBC: 0 % (ref 0.0–0.2)

## 2021-07-10 LAB — CMP (CANCER CENTER ONLY)
ALT: 27 U/L (ref 0–44)
AST: 49 U/L — ABNORMAL HIGH (ref 15–41)
Albumin: 2.9 g/dL — ABNORMAL LOW (ref 3.5–5.0)
Alkaline Phosphatase: 59 U/L (ref 38–126)
Anion gap: 2 — ABNORMAL LOW (ref 5–15)
BUN: 14 mg/dL (ref 8–23)
CO2: 22 mmol/L (ref 22–32)
Calcium: 8.7 mg/dL — ABNORMAL LOW (ref 8.9–10.3)
Chloride: 116 mmol/L — ABNORMAL HIGH (ref 98–111)
Creatinine: 0.87 mg/dL (ref 0.44–1.00)
GFR, Estimated: >60 mL/min (ref >60)
Glucose, Bld: 85 mg/dL (ref 70–99)
Potassium: 3.8 mmol/L (ref 3.5–5.1)
Sodium: 140 mmol/L (ref 135–145)
Total Bilirubin: 0.4 mg/dL (ref 0.3–1.2)
Total Protein: 6.3 g/dL — ABNORMAL LOW (ref 6.5–8.1)

## 2021-07-10 MED ORDER — EPOETIN ALFA-EPBX 10000 UNIT/ML IJ SOLN: 10000.0000 [IU] | Freq: Once | INTRAMUSCULAR | Status: DC

## 2021-07-10 NOTE — Progress Notes (Signed)
Patient is going to reschedule her appointment for a later date.

## 2021-07-10 NOTE — Telephone Encounter (Signed)
Rescheduled upcoming appointments due to patient unable to receive injection today. Patient is aware of changes.

## 2021-07-11 ENCOUNTER — Other Ambulatory Visit: Payer: Self-pay | Admitting: Hematology and Oncology

## 2021-07-11 NOTE — Telephone Encounter (Signed)
Please schedule pt for EGD In the [place]. See notes from Dr. Norman Herrlich.

## 2021-07-11 NOTE — Telephone Encounter (Signed)
Ok for EGD, [facility]  Nausea/vomiting

## 2021-07-12 ENCOUNTER — Inpatient Hospital Stay: Payer: Commercial Managed Care - HMO

## 2021-07-12 ENCOUNTER — Other Ambulatory Visit: Payer: Self-pay

## 2021-07-12 VITALS — BP 121/94 | HR 62 | Temp 98.2°F | Resp 16

## 2021-07-12 DIAGNOSIS — E538 Deficiency of other specified B group vitamins: Secondary | ICD-10-CM | POA: Insufficient documentation

## 2021-07-12 DIAGNOSIS — K911 Postgastric surgery syndromes: Secondary | ICD-10-CM | POA: Insufficient documentation

## 2021-07-12 DIAGNOSIS — D508 Other iron deficiency anemias: Secondary | ICD-10-CM | POA: Insufficient documentation

## 2021-07-12 DIAGNOSIS — D472 Monoclonal gammopathy: Secondary | ICD-10-CM | POA: Diagnosis present

## 2021-07-12 DIAGNOSIS — Z9884 Bariatric surgery status: Secondary | ICD-10-CM

## 2021-07-12 MED ORDER — EPOETIN ALFA-EPBX 10000 UNIT/ML IJ SOLN: 10000.0000 [IU] | Freq: Once | INTRAMUSCULAR | Status: DC

## 2021-07-12 MED ORDER — EPOETIN ALFA-EPBX 10000 UNIT/ML IJ SOLN
10000.0000 [IU] | Freq: Once | INTRAMUSCULAR | Status: AC
  Administered 2021-07-12: 10000 [IU] via SUBCUTANEOUS
  Filled 2021-07-12: qty 1

## 2021-07-12 NOTE — Progress Notes (Signed)
Per Dr Lorenso Courier proceed with retacrit today as pt was unable to receive it the other day. Ilda Foil, RN is working on schedule for [employer]. Patient is aware.

## 2021-07-12 NOTE — Patient Instructions (Signed)
Epoetin Alfa injection ?What is this medication? ?EPOETIN ALFA (e POE e tin AL fa) helps your body make more red blood cells. This medicine is used to treat anemia caused by chronic kidney disease, cancer chemotherapy, or HIV-therapy. It may also be used before surgery if you have anemia. ?This medicine may be used for other purposes; ask your health care provider or pharmacist if you have questions. ?COMMON BRAND NAME(S): Epogen, Procrit, Retacrit ?What should I tell my care team before I take this medication? ?They need to know if you have any of these conditions: ?cancer ?heart disease ?high blood pressure ?history of blood clots ?history of stroke ?low levels of folate, iron, or vitamin B12 in the blood ?seizures ?an unusual or allergic reaction to erythropoietin, albumin, benzyl alcohol, hamster proteins, other medicines, foods, dyes, or preservatives ?pregnant or trying to get pregnant ?breast-feeding ?How should I use this medication? ?This medicine is for injection into a vein or under the skin. It is usually given by a health care professional in a hospital or clinic setting. ?If you get this medicine at home, you will be taught how to prepare and give this medicine. Use exactly as directed. Take your medicine at regular intervals. Do not take your medicine more often than directed. ?It is important that you put your used needles and syringes in a special sharps container. Do not put them in a trash can. If you do not have a sharps container, call your pharmacist or healthcare provider to get one. ?A special MedGuide will be given to you by the pharmacist with each prescription and refill. Be sure to read this information carefully each time. ?Talk to your pediatrician regarding the use of this medicine in children. While this drug may be prescribed for selected conditions, precautions do apply. ?Overdosage: If you think you have taken too much of this medicine contact a poison control center or emergency  room at once. ?NOTE: This medicine is only for you. Do not share this medicine with others. ?What if I miss a dose? ?If you miss a dose, take it as soon as you can. If it is almost time for your next dose, take only that dose. Do not take double or extra doses. ?What may interact with this medication? ?Interactions have not been studied. ?This list may not describe all possible interactions. Give your health care provider a list of all the medicines, herbs, non-prescription drugs, or dietary supplements you use. Also tell them if you smoke, drink alcohol, or use illegal drugs. Some items may interact with your medicine. ?What should I watch for while using this medication? ?Your condition will be monitored carefully while you are receiving this medicine. ?You may need blood work done while you are taking this medicine. ?This medicine may cause a decrease in vitamin B6. You should make sure that you get enough vitamin B6 while you are taking this medicine. Discuss the foods you eat and the vitamins you take with your health care professional. ?What side effects may I notice from receiving this medication? ?Side effects that you should report to your doctor or health care professional as soon as possible: ?allergic reactions like skin rash, itching or hives, swelling of the face, lips, or tongue ?seizures ?signs and symptoms of a blood clot such as breathing problems; changes in vision; chest pain; severe, sudden headache; pain, swelling, warmth in the leg; trouble speaking; sudden numbness or weakness of the face, arm or leg ?signs and symptoms of a stroke like   changes in vision; confusion; trouble speaking or understanding; severe headaches; sudden numbness or weakness of the face, arm or leg; trouble walking; dizziness; loss of balance or coordination ?Side effects that usually do not require medical attention (report to your doctor or health care professional if they continue or are  bothersome): ?chills ?cough ?dizziness ?fever ?headaches ?joint pain ?muscle cramps ?muscle pain ?nausea, vomiting ?pain, redness, or irritation at site where injected ?This list may not describe all possible side effects. Call your doctor for medical advice about side effects. You may report side effects to FDA at 1-800-FDA-1088. ?Where should I keep my medication? ?Keep out of the reach of children. ?Store in a refrigerator between 2 and 8 degrees C (36 and 46 degrees F). Do not freeze or shake. Throw away any unused portion if using a single-dose vial. Multi-dose vials can be kept in the refrigerator for up to 21 days after the initial dose. Throw away unused medicine. ?NOTE: This sheet is a summary. It may not cover all possible information. If you have questions about this medicine, talk to your doctor, pharmacist, or health care provider. ?? 2023 Elsevier/Gold Standard (2016-08-21 00:00:00) ? ?

## 2021-07-17 ENCOUNTER — Other Ambulatory Visit: Payer: Commercial Managed Care - HMO

## 2021-07-17 ENCOUNTER — Ambulatory Visit: Payer: Commercial Managed Care - HMO

## 2021-07-19 ENCOUNTER — Inpatient Hospital Stay: Payer: Commercial Managed Care - HMO

## 2021-07-19 ENCOUNTER — Other Ambulatory Visit: Payer: Self-pay

## 2021-07-19 ENCOUNTER — Encounter: Payer: Self-pay | Admitting: Hematology

## 2021-07-19 VITALS — BP 130/90 | HR 63 | Temp 98.5°F | Resp 18

## 2021-07-19 DIAGNOSIS — D508 Other iron deficiency anemias: Secondary | ICD-10-CM

## 2021-07-19 DIAGNOSIS — Z9884 Bariatric surgery status: Secondary | ICD-10-CM

## 2021-07-19 MED ORDER — SODIUM CHLORIDE 0.9 % IV SOLN: Freq: Once | INTRAVENOUS | Status: AC

## 2021-07-19 MED ORDER — EPOETIN ALFA-EPBX 10000 UNIT/ML IJ SOLN: 10000.0000 [IU] | Freq: Once | INTRAMUSCULAR | Status: DC

## 2021-07-19 MED ORDER — SODIUM CHLORIDE 0.9 % IV SOLN
300.0000 mg | Freq: Once | INTRAVENOUS | Status: AC
  Administered 2021-07-19: 300 mg via INTRAVENOUS
  Filled 2021-07-19: qty 300

## 2021-07-19 NOTE — Patient Instructions (Signed)

## 2021-07-19 NOTE — Progress Notes (Signed)
Patient declined to stay 30 minute observation following iron infusion. She tolerated iron well. VSS at discharge. Ambulatory to the lobby.

## 2021-07-20 ENCOUNTER — Ambulatory Visit: Payer: Commercial Managed Care - HMO | Admitting: Nurse Practitioner

## 2021-07-20 ENCOUNTER — Other Ambulatory Visit: Payer: Self-pay | Admitting: Internal Medicine

## 2021-07-20 ENCOUNTER — Encounter: Payer: Self-pay | Admitting: Nurse Practitioner

## 2021-07-20 ENCOUNTER — Encounter: Payer: Self-pay | Admitting: Hematology

## 2021-07-20 VITALS — BP 106/66 | HR 60 | Ht 67.0 in | Wt 156.0 lb

## 2021-07-20 DIAGNOSIS — K8681 Exocrine pancreatic insufficiency: Secondary | ICD-10-CM

## 2021-07-20 DIAGNOSIS — Z1231 Encounter for screening mammogram for malignant neoplasm of breast: Secondary | ICD-10-CM

## 2021-07-20 DIAGNOSIS — K7460 Unspecified cirrhosis of liver: Secondary | ICD-10-CM | POA: Diagnosis not present

## 2021-07-20 NOTE — Progress Notes (Signed)
Chief Complaint:  follow up on nausea / vomiting   Assessment &  Plan   # Exocrine pancreatic insufficiency. Significantly improved with Creon. Long standing Etoh use. History of possible acute pancreatitis in 2021. CTAP at that time didn't raise concern for chronic pancreatitis Continue Creon. She is taking 2 with meals and 1 with snacks. We discussed that she can reduce dose to 1 with meals if needed since she is tending toward some mild constipation now.    # Cirrhosis, newly diagnosed. Gives history of HCV (treated several years ago through [facility]) and also Etoh use.  She likely had a complete serologic evaluation in the past with [facility]. I told her that I would obtain records. However, she refused to sign the record release form when medical assistant presented it to her.  She doesn't know that she wants [place] to evaluate / manage her cirrhosis. We talked about the fact that chronic liver disease requires monitoring including periodic screening for HCC, varices, etc.  She wants to discuss with her PCP and Dr. Irene Limbo and no pursue any workup for now. Offered EGD at least to screen for varices, she declines.  She is now abstinent from [place].  She can follow up with Dr. Hilarie Fredrickson in 6 months.   # Nausea / vomiting. Not mentioned to me when I saw her in April but she says it has been going on for months. She called office earlier this month and plan was for possible EGD. However, she says the nausea / vomiting resolved with cessation of Etoh and she doesn't want an EGD.   # Remote gastric bypass / b12 deficiency / chronic multifactorial anemia. Anemia followed by Hematology - Dr. Irene Limbo. She will not get blood transfusion due to being [employer]. 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 See PMH for additional history.   Arayla established care here 04/18/21 after being referred for for colon cancer screening, please refer to that note for details.  In summary she reported several months of postprandial, loose oily stool and she had unintentionally lost about 20 pounds over the preceding 6 months.  She was scheduled for a colonoscopy and fecal elastase was obtained to evaluate for exocrine pancreatic insufficiency.  Colonoscopy was unremarkable except for diverticulosis.  Random colon biopsies were negative for microscopic colitis.   A 10-year follow-up colonoscopy recommended. Her fecal elastase returned less than 15.  She was started on pancreatic enzymes.  Mery called our office on 07/05/21 to discuss having an EGD ( upon Dr. Grier Mitts recommendation)  for nausea / vomiting. She had stopped drinking Etoh several days prior.  Dr. Hilarie Fredrickson asked his nurse to schedule her for an EGD and also come in for a visit.  Interval history:   Her loose stool and weight loss have significantly improved on. Creon.  Taking two with meals and one with a snacks usually. Stools no longer loose / oily or frequent. In fact, sometimes stools are hard to expel now. Her appetite is good. No longer losing weight, she is up several pounds pounds over the last several months but some of that is likely fluid weight with lower extremity edema.    She  sent message to Dr. Hilarie Fredrickson on 07/05/21 with complaints of nausea / vomiting which she says was present for months though maybe December though she didn't mention it to me when I saw her in the office in April. Dr Hilarie Fredrickson felt EGD was appropriate but she comes into today and says the vomiting has resolved. She isn't drinking Etoh anymore.   Since I saw her in April 2023 she has had an US showing a gallbladder polyp, small volume ascites,  possible steatosis, mild gallbladder wall thickening. Hemology order elastrography which suggested cirrhosis. Not previously disclosed, or in her PMH she gives a remote history of HCV treated with [facility] ( outside practice ? [facility]).  Yesly is aware of her newly diagnosed cirrhosis. She isn't interested in further evaluation / management right now. She wants to discuss with her PCP.    Previous GI Evaluation   Colonoscopy - Diverticulosis in the descending colon and in the ascending colon. - Internal hemorrhoids. - The examination was otherwise normal. 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  Labs:     Latest Ref Rng & Units 07/10/2021    1:13 PM 06/28/2021   11:29 AM 03/28/2021    4:22 PM  CBC  WBC 4.0 - 10.5 K/uL 6.1  5.9  7.3   Hemoglobin 12.0 - 15.0 g/dL 7.9  7.8  9.2   Hematocrit 36.0 - 46.0 % 24.8  23.7  26.6   Platelets 150 - 400 K/uL 202  113  132        Latest Ref Rng & Units 07/10/2021    1:13 PM 06/28/2021   11:29 AM 03/28/2021    4:22 PM  Hepatic Function  Total Protein 6.5 - 8.1 g/dL 6.3  6.1  6.8   Albumin 3.5 - 5.0 g/dL 2.9  2.9    AST 15 - 41 U/L 49  140  108   ALT 0 - 44 U/L 27  75  79   Alk Phosphatase 38 - 126 U/L 59  73    Total Bilirubin 0.3 - 1.2 mg/dL 0.4  0.9  0.4      Past Medical History:  Diagnosis Date   Allergy    all rhinitis   Dependent edema    Dermatitis    GERD (gastroesophageal reflux disease)    Hypertension     Past Surgical History:  Procedure Laterality Date   CESAREAN SECTION     x2   TUBAL LIGATION      Current Medications, Allergies, Family History and Social  History were reviewed in [employer] record.     Current Outpatient Medications  Medication Sig Dispense Refill   cyanocobalamin (,VITAMIN B-12,) 1000 MCG/ML injection INJECT 1 MLS EVERY WEEK FOR 4 WEEKS THEN INJECT 1 ML MONTHLY THEREAFTER 6 mL 1   DUPIXENT 300 MG/2ML prefilled syringe Inject 300 mg into the skin every 14 (fourteen) days. (Patient not taking: Reported on 7/97/2820)     folic acid (FOLVITE) 1 MG tablet Take 1 tablet (1 mg total) by mouth daily. (Patient not taking: Reported on 05/25/2021) 30 tablet 1   hydrOXYzine (VISTARIL) 25 MG capsule Take 1-2 capsules (25-50 mg total) by mouth at  bedtime as needed. (Patient not taking: Reported on 05/25/2021) 30 capsule 1   methocarbamol (ROBAXIN) 500 MG tablet Take 500 mg by mouth 2 (two) times daily.     Pancrelipase, Lip-Prot-Amyl, (CREON) 24000-76000 units CPEP Take 2 capsules (48,000 Units total) by mouth with breakfast, with lunch, and with evening meal AND 1 capsule (24,000 Units total) with snacks. 180 capsule 3   thiamine 100 MG tablet Take 1 tablet (100 mg total) by mouth daily. (Patient not taking: Reported on 05/25/2021) 30 tablet 1   triamcinolone cream (KENALOG) 0.1 % Apply 1 application topically 4 (four) times daily as needed. (Patient not taking: Reported on 05/25/2021) 30 g 0   Vitamin D, Ergocalciferol, (DRISDOL) 1.25 MG (50000 UNIT) CAPS capsule TAKE ONE CAPSULE BY MOUTH WEEKLY 12 capsule 4   No current facility-administered medications for this visit.    Review of Systems: No chest pain. No shortness of breath. No urinary complaints.    Physical Exam  Wt Readings from Last 3 Encounters:  06/28/21 150 lb 14.4 oz (68.4 kg)  05/25/21 139 lb (63 kg)  04/18/21 139 lb 9.6 oz (63.3 kg)    LMP 04/14/2012 . BP 106/66, HR 60. Wt 156 pounds.  Constitutional:  Generally well appearing female in no acute distress. Psychiatric: Pleasant. Normal mood and affect. Behavior is normal. EENT: Pupils normal.   Conjunctivae are normal. No scleral icterus. Neck supple.  Cardiovascular: Normal rate, regular rhythm. BLE edema Pulmonary/chest: Effort normal and breath sounds normal. No wheezing, rales or rhonchi. Abdominal: Soft, nondistended, nontender. Bowel sounds active throughout. There are no masses palpable. No hepatomegaly. Neurological: Alert and oriented to person place and time. Skin: Skin is warm and dry. No rashes noted.  Tye Savoy, NP  07/20/2021, 8:30 AM  I spent 30 minutes total reviewing records, obtaining history, performing exam, counseling patient and documenting visit / findings.   Cc:  Debra Ebbs, MD

## 2021-07-20 NOTE — Patient Instructions (Addendum)
If you are age 62 or older, your body mass index should be between 23-30. Your Body mass index is 24.43 kg/m. If this is out of the aforementioned range listed, please consider follow up with your Primary Care Provider.  If you are age 57 or younger, your body mass index should be between 19-25. Your Body mass index is 24.43 kg/m. If this is out of the aformentioned range listed, please consider follow up with your Primary Care Provider.   Continue Creon.   Follow up in 6 months with Dr.Pyrtle.  The [facility] providers would like to encourage you to use [facility] to communicate with providers for non-urgent requests or questions.  Due to long hold times on the telephone, sending your provider a message by [facility] may be a faster and more efficient way to get a response.  Please allow 48 business hours for a response.  Please remember that this is for non-urgent requests.   It was a pleasure to see you today!  Thank you for trusting me with your gastrointestinal care!    Tye Savoy , NP

## 2021-07-21 ENCOUNTER — Encounter: Payer: Self-pay | Admitting: Hematology

## 2021-07-24 ENCOUNTER — Ambulatory Visit: Payer: Commercial Managed Care - HMO

## 2021-07-24 ENCOUNTER — Ambulatory Visit: Payer: Commercial Managed Care - HMO | Admitting: Orthopedic Surgery

## 2021-07-24 ENCOUNTER — Other Ambulatory Visit: Payer: Self-pay | Admitting: Pharmacist

## 2021-07-24 ENCOUNTER — Other Ambulatory Visit: Payer: Commercial Managed Care - HMO

## 2021-07-24 ENCOUNTER — Encounter: Payer: Self-pay | Admitting: Orthopedic Surgery

## 2021-07-24 DIAGNOSIS — M25561 Pain in right knee: Secondary | ICD-10-CM | POA: Diagnosis not present

## 2021-07-24 DIAGNOSIS — M25562 Pain in left knee: Secondary | ICD-10-CM

## 2021-07-24 DIAGNOSIS — G8929 Other chronic pain: Secondary | ICD-10-CM | POA: Diagnosis not present

## 2021-07-24 NOTE — Progress Notes (Signed)
Addendum: Reviewed and agree with assessment and management plan. Agree with additional recommendations given concern for cirrhosis.  However at this time patient declining further evaluation.  Follow-up recommended. Complete alcohol abstinence strongly recommended Natalie Mceuen, Lajuan Lines, MD

## 2021-07-24 NOTE — Progress Notes (Signed)
Office Visit Note   Patient: Debra Jacobson           Date of Birth: [birth year]           MRN: [id number] Visit Date: 07/24/2021              Requested by: Nolene Ebbs, MD [place],   [id number] PCP: Nolene Ebbs, MD  Chief Complaint  Patient presents with   Right Knee - Follow-up    S/p bilat knee injection    Left Knee - Follow-up      HPI: Patient is a 61 year old woman presents in follow-up for osteoarthritis bilateral knees patient complains of pain over the lateral joint line pain with activities of daily living.  Assessment & Plan: Visit Diagnoses:  1. Chronic pain of left knee   2. Chronic pain of right knee     Plan: Both knees were injected she tolerated this well she does have thigh-high compression stockings and she will continue using these.  Follow-Up Instructions: Return if symptoms worsen or fail to improve.   Ortho Exam  Patient is alert, oriented, no adenopathy, well-dressed, normal affect, normal respiratory effort. Examination patient has brawny edema and swelling of both legs there is no open ulcers no drainage there is crepitation with range of motion of both knees collaterals and cruciates are stable.  Imaging: No results found. No images are attached to the encounter.  Labs: Lab Results  Component Value Date   HGBA1C 5.5 10/26/2011   HGBA1C 5.4 09/10/2011   ESRSEDRATE 14 03/28/2021   ESRSEDRATE 37 (H) 09/01/2018   LABURIC 9.3 (H) 03/28/2021     Lab Results  Component Value Date   ALBUMIN 2.9 (L) 07/10/2021   ALBUMIN 2.9 (L) 06/28/2021   ALBUMIN 3.1 (L) 02/13/2021   PREALBUMIN 9.1 (L) 10/26/2011    Lab Results  Component Value Date   MG 2.1 01/31/2021   MG 1.7 05/20/2019   Lab Results  Component Value Date   VD25OH 27 (L) 03/28/2021   VD25OH 45 03/01/2011    Lab Results  Component Value Date   PREALBUMIN 9.1 (L) 10/26/2011      Latest Ref Rng & Units 07/10/2021    1:13 PM 06/28/2021   11:29  AM 03/28/2021    4:22 PM  CBC EXTENDED  WBC 4.0 - 10.5 K/uL 6.1  5.9  7.3   RBC 3.87 - 5.11 MIL/uL 2.15  2.14  2.48   Hemoglobin 12.0 - 15.0 g/dL 7.9  7.8  9.2   HCT 36.0 - 46.0 % 24.8  23.7  26.6   Platelets 150 - 400 K/uL 202  113  132   NEUT# 1.7 - 7.7 K/uL 4.0  4.1    Lymph# 0.7 - 4.0 K/uL 1.5  1.3       There is no height or weight on file to calculate BMI.  Orders:  No orders of the defined types were placed in this encounter.  No orders of the defined types were placed in this encounter.    Procedures: Large Joint Inj: bilateral knee on 07/24/2021 12:36 PM Indications: pain and diagnostic evaluation Details: 22 G 1.5 in needle, anteromedial approach  Arthrogram: No  Outcome: tolerated well, no immediate complications Procedure, treatment alternatives, risks and benefits explained, specific risks discussed. Consent was given by the patient. Immediately prior to procedure a time out was called to verify the correct patient, procedure, equipment, support staff and site/side marked as required. Patient was prepped  and draped in the usual sterile fashion.      Clinical Data: No additional findings.  ROS:  All other systems negative, except as noted in the HPI. Review of Systems  Objective: Vital Signs: LMP 04/14/2012   Specialty Comments:  No specialty comments available.  PMFS History: Patient Active Problem List   Diagnosis Date Noted   Acute alcoholic pancreatitis 50/09/3816   Alcohol dependence ([place]) 05/20/2019   Iron deficiency anemia 09/10/2018   Eczema 12/09/2011   Status post gastric bypass for obesity 05/30/2011   Past Medical History:  Diagnosis Date   Allergy    all rhinitis   Dependent edema    Dermatitis    GERD (gastroesophageal reflux disease)    Hypertension     Family History  Problem Relation Age of Onset   Hyperlipidemia Mother    Non-Hodgkin's lymphoma Father    Diabetes Maternal Grandmother    Colon cancer Neg Hx    Stomach  cancer Neg Hx    Esophageal cancer Neg Hx    Colon polyps Neg Hx    Pancreatic cancer Neg Hx     Past Surgical History:  Procedure Laterality Date   CESAREAN SECTION     x2   TUBAL LIGATION     Social History   Occupational History   Occupation: Midwife  Tobacco Use   Smoking status: Former    Types: Cigarettes    Quit date: 08/29/1991    Years since quitting: 29.9   Smokeless tobacco: Never  Vaping Use   Vaping Use: Never used  Substance and Sexual Activity   Alcohol use: Not Currently    Comment: Patient drinks 2 40oz beers multiple days a week   Drug use: No   Sexual activity: Not on file

## 2021-07-26 ENCOUNTER — Other Ambulatory Visit: Payer: Self-pay

## 2021-07-26 ENCOUNTER — Other Ambulatory Visit: Payer: Self-pay | Admitting: Hematology

## 2021-07-26 ENCOUNTER — Inpatient Hospital Stay: Payer: Commercial Managed Care - HMO

## 2021-07-26 VITALS — BP 116/81 | HR 64 | Temp 98.7°F | Resp 17

## 2021-07-26 DIAGNOSIS — Z9884 Bariatric surgery status: Secondary | ICD-10-CM

## 2021-07-26 DIAGNOSIS — D508 Other iron deficiency anemias: Secondary | ICD-10-CM

## 2021-07-26 MED ORDER — SODIUM CHLORIDE 0.9 % IV SOLN
300.0000 mg | Freq: Once | INTRAVENOUS | Status: AC
  Administered 2021-07-26: 300 mg via INTRAVENOUS
  Filled 2021-07-26: qty 300

## 2021-07-26 MED ORDER — ACETAMINOPHEN 325 MG PO TABS: 650.0000 mg | ORAL_TABLET | Freq: Once | ORAL | Status: DC

## 2021-07-26 MED ORDER — SODIUM CHLORIDE 0.9 % IV SOLN: INTRAVENOUS | Status: DC

## 2021-07-26 MED ORDER — LORATADINE 10 MG PO TABS
10.0000 mg | ORAL_TABLET | Freq: Once | ORAL | Status: AC
  Administered 2021-07-26: 10 mg via ORAL
  Filled 2021-07-26: qty 1

## 2021-07-26 NOTE — Patient Instructions (Signed)

## 2021-07-27 ENCOUNTER — Other Ambulatory Visit: Payer: Self-pay | Admitting: Radiology

## 2021-07-27 DIAGNOSIS — D892 Hypergammaglobulinemia, unspecified: Secondary | ICD-10-CM

## 2021-07-28 ENCOUNTER — Ambulatory Visit (HOSPITAL_COMMUNITY)
Admission: RE | Admit: 2021-07-28 | Discharge: 2021-07-28 | Disposition: A | Discharge status: Home / Self Care | Payer: Commercial Managed Care - HMO | Source: Ambulatory Visit | Attending: Hematology | Admitting: Hematology

## 2021-07-28 ENCOUNTER — Telehealth: Payer: Self-pay | Admitting: Nurse Practitioner

## 2021-07-28 ENCOUNTER — Encounter (HOSPITAL_COMMUNITY): Payer: Self-pay

## 2021-07-28 ENCOUNTER — Other Ambulatory Visit: Payer: Self-pay

## 2021-07-28 DIAGNOSIS — D539 Nutritional anemia, unspecified: Secondary | ICD-10-CM | POA: Diagnosis not present

## 2021-07-28 DIAGNOSIS — D472 Monoclonal gammopathy: Secondary | ICD-10-CM | POA: Insufficient documentation

## 2021-07-28 DIAGNOSIS — C9000 Multiple myeloma not having achieved remission: Secondary | ICD-10-CM | POA: Insufficient documentation

## 2021-07-28 DIAGNOSIS — I10 Essential (primary) hypertension: Secondary | ICD-10-CM | POA: Diagnosis not present

## 2021-07-28 DIAGNOSIS — D892 Hypergammaglobulinemia, unspecified: Secondary | ICD-10-CM | POA: Insufficient documentation

## 2021-07-28 DIAGNOSIS — R718 Other abnormality of red blood cells: Secondary | ICD-10-CM

## 2021-07-28 LAB — CBC WITH DIFFERENTIAL/PLATELET
Abs Immature Granulocytes: 0.01 10*3/uL (ref 0.00–0.07)
Basophils Absolute: 0 10*3/uL (ref 0.0–0.1)
Basophils Relative: 1 %
Eosinophils Absolute: 0.1 10*3/uL (ref 0.0–0.5)
Eosinophils Relative: 2 %
HCT: 27.5 % — ABNORMAL LOW (ref 36.0–46.0)
Hemoglobin: 8.6 g/dL — ABNORMAL LOW (ref 12.0–15.0)
Immature Granulocytes: 0 %
Lymphocytes Relative: 22 %
Lymphs Abs: 1.4 10*3/uL (ref 0.7–4.0)
MCH: 36.3 pg — ABNORMAL HIGH (ref 26.0–34.0)
MCHC: 31.3 g/dL (ref 30.0–36.0)
MCV: 116 fL — ABNORMAL HIGH (ref 80.0–100.0)
Monocytes Absolute: 0.8 10*3/uL (ref 0.1–1.0)
Monocytes Relative: 13 %
Neutro Abs: 4.1 10*3/uL (ref 1.7–7.7)
Neutrophils Relative %: 62 %
Platelets: 168 10*3/uL (ref 150–400)
RBC: 2.37 MIL/uL — ABNORMAL LOW (ref 3.87–5.11)
RDW: 14.2 % (ref 11.5–15.5)
WBC: 6.5 10*3/uL (ref 4.0–10.5)
nRBC: 0 % (ref 0.0–0.2)

## 2021-07-28 LAB — NO BLOOD PRODUCTS

## 2021-07-28 MED ORDER — MIDAZOLAM HCL 2 MG/2ML IJ SOLN
INTRAMUSCULAR | Status: AC | PRN
  Administered 2021-07-28: 1 mg via INTRAVENOUS

## 2021-07-28 MED ORDER — FLUMAZENIL 0.5 MG/5ML IV SOLN
INTRAVENOUS | Status: AC
  Filled 2021-07-28: qty 5

## 2021-07-28 MED ORDER — SODIUM CHLORIDE 0.9 % IV SOLN: INTRAVENOUS | Status: DC

## 2021-07-28 MED ORDER — MIDAZOLAM HCL 2 MG/2ML IJ SOLN
INTRAMUSCULAR | Status: AC
  Filled 2021-07-28: qty 4

## 2021-07-28 MED ORDER — LIDOCAINE HCL (PF) 1 % IJ SOLN
INTRAMUSCULAR | Status: AC | PRN
  Administered 2021-07-28: 10 mL via INTRADERMAL

## 2021-07-28 MED ORDER — FENTANYL CITRATE (PF) 100 MCG/2ML IJ SOLN
INTRAMUSCULAR | Status: AC | PRN
  Administered 2021-07-28: 50 ug via INTRAVENOUS

## 2021-07-28 MED ORDER — NALOXONE HCL 0.4 MG/ML IJ SOLN
INTRAMUSCULAR | Status: AC
  Filled 2021-07-28: qty 1

## 2021-07-28 MED ORDER — FENTANYL CITRATE (PF) 100 MCG/2ML IJ SOLN
INTRAMUSCULAR | Status: AC
  Filled 2021-07-28: qty 4

## 2021-07-28 NOTE — Procedures (Signed)
Interventional Radiology Procedure Note  Procedure: CT guided bone marrow aspiration and biopsy  Complications: None  EBL: < 10 mL  Findings: Aspirate and core biopsy performed of bone marrow in right iliac bone.  Plan: Bedrest supine x 1 hrs  Marqual Mi T. Phuoc Huy, M.D Pager:  [phone]   

## 2021-07-28 NOTE — H&P (Signed)
Chief Complaint: Patient was seen in consultation today for smoldering myeloma and multifactorial anemia  at the request of Brunetta Genera  Referring Physician(s): Brunetta Genera  Supervising Physician: Aletta Edouard  Patient Status: [facility] - In-pt  History of Present Illness: Debra Jacobson is a 61 y.o. 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   Patient is known to IR service for BMBx in September 2020.  Patient developed bone pain in December 2022 in her left femur and right knee, she also developed fatigue recently. Patient had visit with Dr. Irene Limbo on 06/28/21 which showed significant drop in hgb, as well as drop in plt. Repeat BMBx was recommended to the patient for further evaluation, patient decided to proceed.   Patient presents to [facility] IR for BMBx today.   Patient laying in bed, not in acute distress. RN at bedside starting IV.  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 She is concerned that her myeloma has progressed.  Denise headache, fever, chills, shortness of breath, cough, chest pain, abdominal pain, nausea ,vomiting, and bleeding.   Past Medical History:  Diagnosis Date   Allergy    all rhinitis   Dependent edema    Dermatitis    GERD (gastroesophageal reflux disease)    Hypertension     Past Surgical History:  Procedure Laterality Date   CESAREAN SECTION     x2   TUBAL LIGATION      Allergies: Patient has no known allergies.  Medications: Prior to Admission medications   Medication Sig Start Date End Date Taking? Authorizing Provider  cyanocobalamin (,VITAMIN B-12,) 1000 MCG/ML injection INJECT 1 MLS EVERY WEEK FOR 4 WEEKS THEN INJECT 1 ML MONTHLY THEREAFTER 03/01/21  Yes Brunetta Genera, MD  diphenoxylate-atropine (LOMOTIL) 2.5-0.025 MG tablet Take 1 tablet by mouth every 6 (six) hours as needed. 06/22/21  Yes  [provider]  DUPIXENT 300 MG/2ML prefilled syringe Inject 300 mg into the skin every 14 (fourteen) days. 04/09/19  Yes [provider]  epoetin alfa-epbx (RETACRIT) 09470 UNIT/ML injection 10,000 Units as directed.   Yes [provider]  meloxicam (MOBIC) 15 MG tablet Take 15 mg by mouth daily.   Yes [provider]  methocarbamol (ROBAXIN) 500 MG tablet Take 500 mg by mouth 2 (two) times daily. 04/11/21  Yes [provider]  Pancrelipase, Lip-Prot-Amyl, (CREON) 24000-76000 units CPEP Take 2 capsules (48,000 Units total) by mouth with breakfast, with lunch, and with evening meal AND 1 capsule (24,000 Units total) with snacks. 06/21/21  Yes Willia Craze, NP  Vitamin D, Ergocalciferol, (DRISDOL) 1.25 MG (50000 UNIT) CAPS capsule TAKE ONE CAPSULE BY MOUTH WEEKLY 08/22/20  Yes Brunetta Genera, MD  folic acid (FOLVITE) 1 MG tablet Take 1 tablet (1 mg total) by mouth daily. Patient not taking: Reported on 05/25/2021 01/31/21   Sherrell Puller, PA-C  furosemide (LASIX) 40 MG tablet Take 40 mg by mouth as needed.    [provider]  hydrOXYzine (VISTARIL) 25 MG capsule Take 1-2 capsules (25-50 mg total) by mouth at bedtime as needed. Patient not taking: Reported on 05/25/2021 08/05/20   Brunetta Genera, MD  thiamine 100 MG tablet Take 1 tablet (100 mg total) by mouth daily. Patient not taking: Reported on 05/25/2021 01/31/21   Sherrell Puller, PA-C  triamcinolone cream (KENALOG) 0.1 % Apply 1 application topically 4 (four)  times daily as needed. Patient not taking: Reported on 05/25/2021 05/22/16   Clayton Bibles, PA-C     Family History  Problem Relation Age of Onset   Hyperlipidemia Mother    Non-Hodgkin's lymphoma Father    Diabetes Maternal Grandmother    Colon cancer Neg Hx    Stomach cancer Neg Hx    Esophageal cancer Neg Hx    Colon polyps Neg Hx    Pancreatic cancer Neg Hx     Social History   Socioeconomic History   Marital status:  Legally Separated    Spouse name: Not on file   Number of children: 3   Years of education: Not on file   Highest education level: Not on file  Occupational History   Occupation: Travel nurse  Tobacco Use   Smoking status: Former    Types: Cigarettes    Quit date: 08/29/1991    Years since quitting: 29.9   Smokeless tobacco: Never  Vaping Use   Vaping Use: Never used  Substance and Sexual Activity   Alcohol use: Not Currently    Comment: Patient drinks 2 40oz beers multiple days a week   Drug use: No   Sexual activity: Not on file  Other Topics Concern   Not on file  Social History Narrative   Not on file   Social Determinants of Health   Financial Resource Strain: Not on file  Food Insecurity: Not on file  Transportation Needs: Not on file  Physical Activity: Not on file  Stress: Not on file  Social Connections: Not on file     Review of Systems: A 12 point ROS discussed and pertinent positives are indicated in the HPI above.  All other systems are negative.  Vital Signs: BP 118/86 (BP Location: Left Arm)   Pulse (!) 59   Temp 98.1 F (36.7 C) (Oral)   Resp 15   Ht '5\' 7"'$  (1.702 m)   Wt 156 lb (70.8 kg)   LMP 04/14/2012   SpO2 100%   BMI 24.43 kg/m   Physical Exam Vitals and nursing note reviewed.  Constitutional:      General: Patient is not in acute distress.    Appearance: Normal appearance. Patient is not ill-appearing.  HENT:     Head: Normocephalic and atraumatic.     Mouth/Throat:     Mouth: Mucous membranes are moist.     Pharynx: Oropharynx is clear.  Cardiovascular:     Rate and Rhythm: Normal rate and regular rhythm.     Pulses: Normal pulses.     Heart sounds: Normal heart sounds.  Pulmonary:     Effort: Pulmonary effort is normal.     Breath sounds: Normal breath sounds.  Abdominal:     General: Abdomen is flat. Bowel sounds are normal.     Palpations: Abdomen is soft.  Musculoskeletal:     Cervical back: Neck supple.  Skin:     General: Skin is warm and dry.     Coloration: Skin is not jaundiced or pale.  Neurological:     Mental Status: Patient is alert and oriented to person, place, and time.  Psychiatric:        Mood and Affect: Mood normal.        Behavior: Behavior normal.        Judgment: Judgment normal.     MD Evaluation Airway: WNL Heart: WNL Abdomen: WNL Chest/ Lungs: WNL ASA  Classification: 3 Mallampati/Airway Score: One  Imaging: [place] ELASTOGRAPHY LIVER  Result Date: 07/10/2021 CLINICAL DATA:  Alcohol abuse, abnormal ultrasound EXAM: US LIVER ELASTOGRAPHY TECHNIQUE: Sonography of the liver was performed. In addition, ultrasound elastography evaluation of the liver was performed. A region of interest was placed within the right lobe of the liver. Following application of a compressive sonographic pulse, tissue compressibility was assessed. Multiple assessments were performed at the selected site. Median tissue compressibility was determined. Previously, hepatic stiffness was assessed by shear wave velocity. Based on recently published Society of Radiologists in Ultrasound consensus article, reporting is now recommended to be performed in the SI units of pressure (kiloPascals) representing hepatic stiffness/elasticity. The obtained result is compared to the published reference standards. (cACLD = compensated Advanced Chronic Liver Disease) COMPARISON:  Ultrasound abdomen 06/13/2021 FINDINGS: Liver: Increased hepatic echogenicity. Micro nodular appearing hepatic margin. Findings are suggestive of cirrhosis. No focal hepatic mass or intrahepatic biliary dilatation. Trace ascites noted. Portal vein is patent on color Doppler imaging with normal direction of blood flow towards the liver. ULTRASOUND HEPATIC ELASTOGRAPHY Device: Siemens Helix VTQ Patient position: Supine Transducer: 9C2 Number of measurements: 12 Hepatic segment:  8 Median kPa: 16.6 IQR: 4.0 IQR/Median kPa ratio: 0.24 Data quality:  Good Diagnostic  category:  >13 kPa: highly suggestive of cACLD The use of hepatic elastography is applicable to patients with viral hepatitis and non-alcoholic fatty liver disease. At this time, there is insufficient data for the referenced cut-off values and use in other causes of liver disease, including alcoholic liver disease. Patients, however, may be assessed by elastography and serve as their own reference standard/baseline. In patients with non-alcoholic liver disease, the values suggesting compensated advanced chronic liver disease (cACLD) may be lower, and patients may need additional testing with elasticity results of 7-9 kPa. Please note that abnormal hepatic elasticity and shear wave velocities may also be identified in clinical settings other than with hepatic fibrosis, such as: acute hepatitis, elevated right heart and central venous pressures including use of beta blockers, veno-occlusive disease (Budd-Chiari), infiltrative processes such as mastocytosis/amyloidosis/infiltrative tumor/lymphoma, extrahepatic cholestasis, with hyperemia in the post-prandial state, and with liver transplantation. Correlation with patient history, laboratory data, and clinical condition recommended. Diagnostic Categories: < or =5 kPa: high probability of being normal < or =9 kPa: in the absence of other known clinical signs, rules out cACLD >9 kPa and ?13 kPa: suggestive of cACLD, but needs further testing >13 kPa: highly suggestive of cACLD > or =17 kPa: highly suggestive of cACLD with an increased probability of clinically significant portal hypertension IMPRESSION: ULTRASOUND LIVER: Increased hepatic echogenicity with micro nodular contour highly suggestive of cirrhosis. Trace perihepatic ascites. ULTRASOUND HEPATIC ELASTOGRAPHY: Median kPa:  16.6 Diagnostic category:  >13 kPa: highly suggestive of cACLD Electronically Signed   By: Lavonia Dana M.D.   On: 07/10/2021 12:39   VAS [place] LOWER EXTREMITY VENOUS (DVT)  Result Date:  07/06/2021  Lower Venous DVT Study Patient Name:  Debra Jacobson  Date of Exam:   07/06/2021 Medical Rec #: [id number]        Accession #:    [id number] Date of Birth: May 24, 1960        Patient Gender: F Patient Age:   59 years Exam Location:  [facility] Procedure:      VAS [place] LOWER EXTREMITY VENOUS (DVT) Referring Phys: Sullivan Lone --------------------------------------------------------------------------------  Indications: Edema.  Risk Factors: Macrocytic anemia and smoldering myeloma. Comparison Study: Previous exam on 08/08/20 was negative for DVT. Performing Technologist: Rogelia Rohrer RVT, RDMS  Examination Guidelines: A complete evaluation includes  B-mode imaging, spectral Doppler, color Doppler, and power Doppler as needed of all accessible portions of each vessel. Bilateral testing is considered an integral part of a complete examination. Limited examinations for reoccurring indications may be performed as noted. The reflux portion of the exam is performed with the patient in reverse Trendelenburg.  +---------+---------------+---------+-----------+----------+--------------+ RIGHT    CompressibilityPhasicitySpontaneityPropertiesThrombus Aging +---------+---------------+---------+-----------+----------+--------------+ CFV      Full           Yes      Yes                                 +---------+---------------+---------+-----------+----------+--------------+ SFJ      Full                                                        +---------+---------------+---------+-----------+----------+--------------+ FV Prox  Full           Yes      Yes                                 +---------+---------------+---------+-----------+----------+--------------+ FV Mid   Full           Yes      Yes                                 +---------+---------------+---------+-----------+----------+--------------+ FV DistalFull           Yes      Yes                                  +---------+---------------+---------+-----------+----------+--------------+ PFV      Full                                                        +---------+---------------+---------+-----------+----------+--------------+ POP      Full           Yes      Yes                                 +---------+---------------+---------+-----------+----------+--------------+ PTV      Full                                                        +---------+---------------+---------+-----------+----------+--------------+ PERO     Full                                                        +---------+---------------+---------+-----------+----------+--------------+   +---------+---------------+---------+-----------+----------+--------------+ LEFT     CompressibilityPhasicitySpontaneityPropertiesThrombus Aging +---------+---------------+---------+-----------+----------+--------------+  CFV      Full           Yes      Yes                                 +---------+---------------+---------+-----------+----------+--------------+ SFJ      Full                                                        +---------+---------------+---------+-----------+----------+--------------+ FV Prox  Full           Yes      Yes                                 +---------+---------------+---------+-----------+----------+--------------+ FV Mid   Full           Yes      Yes                                 +---------+---------------+---------+-----------+----------+--------------+ FV DistalFull           Yes      Yes                                 +---------+---------------+---------+-----------+----------+--------------+ PFV      Full                                                        +---------+---------------+---------+-----------+----------+--------------+ POP      Full           Yes      Yes                                  +---------+---------------+---------+-----------+----------+--------------+ PTV      Full                                                        +---------+---------------+---------+-----------+----------+--------------+ PERO     Full                                                        +---------+---------------+---------+-----------+----------+--------------+     Summary: BILATERAL: - No evidence of deep vein thrombosis seen in the lower extremities, bilaterally. -No evidence of popliteal cyst, bilaterally. -Diffuse subcutaneous edema, bilaterally.  *See table(s) above for measurements and observations. Electronically signed by Jamelle Haring on 07/06/2021 at 5:41:52 PM.    Final     Labs:  CBC: Recent Labs    02/13/21 1257 02/13/21 1258 03/28/21 1622 06/28/21 1129 07/10/21 1313  WBC 4.9  --  7.3 5.9 6.1  HGB 8.4*  --  9.2* 7.8* 7.9*  HCT 25.6* 26.5* 26.6* 23.7* 24.8*  PLT 135*  --  132* 113* 202    COAGS: No results for input(s): "INR", "APTT" in the last 8760 hours.  BMP: Recent Labs    01/31/21 1347 02/13/21 1257 03/28/21 1622 06/28/21 1129 07/10/21 1313  NA 138 142 145 137 140  K 4.4 5.0 4.6 3.8 3.8  CL 112* 115* 117* 111 116*  CO2 21* 24 15* 19* 22  GLUCOSE 88 93 85 98 85  BUN 10 7* '15 10 14  '$ CALCIUM 8.2* 8.4* 8.6 8.4* 8.7*  CREATININE 0.93 1.05* 0.94 0.97 0.87  GFRNONAA >60 >60  --  >60 >60    LIVER FUNCTION TESTS: Recent Labs    01/31/21 1347 02/13/21 1257 03/28/21 1622 06/28/21 1129 07/10/21 1313  BILITOT 0.3 0.4 0.4 0.9 0.4  AST 99* 139* 108* 140* 49*  ALT 109* 88* 79* 75* 27  ALKPHOS 76 72  --  73 59  PROT 6.7 6.3* 6.8 6.1* 6.3*  ALBUMIN 3.1* 3.1*  --  2.9* 2.9*    TUMOR MARKERS: No results for input(s): "AFPTM", "CEA", "CA199", "CHROMGRNA" in the last 8760 hours.  Assessment and Plan: 61 y.o. female with hx of chronic anemia and IgG Kappa Paraproteinemia, recent lab showed significant drop in hgb. She is in ineed of BMBx for  further eval.   NPO since MN VSS Not on AC/AP Patient is Jehovah's Witness   Risks and benefits of BMBX was discussed with the patient and/or patient's family including, but not limited to bleeding, infection, damage to adjacent structures or low yield requiring additional tests.  All of the questions were answered and there is agreement to proceed.  Consent signed and in chart.     Thank you for this interesting consult.  I greatly enjoyed meeting ANDRE GALLEGO and look forward to participating in their care.  A copy of this report was sent to the requesting provider on this date.  Electronically Signed: Tera Mater, PA-C 07/28/2021, 8:23 AM   I spent a total of 20 Minutes    in face to face in clinical consultation, greater than 50% of which was counseling/coordinating care for BMBx.  This chart was dictated using voice recognition software.  Despite best efforts to proofread,  errors can occur which can change the documentation meaning.

## 2021-07-28 NOTE — Telephone Encounter (Addendum)
Patient called to follow up on records, states she signed a medical release form to be faxed over to her previous GI office on her last visit 07/20/21. Advised her I did not see anything on file. [facility] called as well to request the form again to [phone] because they did not receive it.

## 2021-07-28 NOTE — Discharge Instructions (Signed)

## 2021-07-31 ENCOUNTER — Other Ambulatory Visit: Payer: Commercial Managed Care - HMO

## 2021-07-31 ENCOUNTER — Ambulatory Visit: Payer: Commercial Managed Care - HMO

## 2021-07-31 NOTE — Telephone Encounter (Signed)
Records faxed to number provided by [facility].

## 2021-08-01 ENCOUNTER — Other Ambulatory Visit: Payer: Self-pay | Admitting: Hematology

## 2021-08-01 LAB — SURGICAL PATHOLOGY

## 2021-08-01 NOTE — Telephone Encounter (Signed)
Called [facility] and informed them that we had not received any records for the patient.  I spoke with Lonn Georgia who gave me an alternative fax number [phone].

## 2021-08-02 ENCOUNTER — Inpatient Hospital Stay: Payer: Commercial Managed Care - HMO | Attending: Hematology

## 2021-08-02 ENCOUNTER — Other Ambulatory Visit: Payer: Self-pay | Admitting: Hematology

## 2021-08-02 VITALS — BP 110/85 | HR 67 | Temp 98.2°F | Resp 17

## 2021-08-02 DIAGNOSIS — E538 Deficiency of other specified B group vitamins: Secondary | ICD-10-CM | POA: Diagnosis present

## 2021-08-02 DIAGNOSIS — D508 Other iron deficiency anemias: Secondary | ICD-10-CM | POA: Diagnosis present

## 2021-08-02 DIAGNOSIS — D472 Monoclonal gammopathy: Secondary | ICD-10-CM | POA: Insufficient documentation

## 2021-08-02 DIAGNOSIS — Z9884 Bariatric surgery status: Secondary | ICD-10-CM

## 2021-08-02 MED ORDER — SODIUM CHLORIDE 0.9 % IV SOLN
300.0000 mg | Freq: Once | INTRAVENOUS | Status: AC
  Administered 2021-08-02: 300 mg via INTRAVENOUS
  Filled 2021-08-02: qty 300

## 2021-08-02 MED ORDER — SODIUM CHLORIDE 0.9 % IV SOLN: INTRAVENOUS | Status: DC

## 2021-08-02 NOTE — Telephone Encounter (Signed)
Contacted patient and let her know that we have received records form [facility].

## 2021-08-02 NOTE — Patient Instructions (Signed)

## 2021-08-02 NOTE — Telephone Encounter (Signed)
Contacted patient and let her know that form had been faxed multiple times. I also let her know that I contacted [facility] and was given another fax number and I have not received anything yet. Patient stated she understood and asked that I keep working on it.

## 2021-08-07 ENCOUNTER — Encounter (HOSPITAL_COMMUNITY): Payer: Self-pay | Admitting: Hematology

## 2021-08-08 ENCOUNTER — Encounter (HOSPITAL_COMMUNITY): Payer: Self-pay | Admitting: Hematology

## 2021-08-09 ENCOUNTER — Inpatient Hospital Stay: Payer: Commercial Managed Care - HMO | Admitting: Hematology

## 2021-08-09 ENCOUNTER — Inpatient Hospital Stay: Payer: Commercial Managed Care - HMO

## 2021-08-11 ENCOUNTER — Other Ambulatory Visit: Payer: Self-pay | Admitting: *Deleted

## 2021-08-11 DIAGNOSIS — D508 Other iron deficiency anemias: Secondary | ICD-10-CM

## 2021-08-11 DIAGNOSIS — D472 Monoclonal gammopathy: Secondary | ICD-10-CM

## 2021-08-14 ENCOUNTER — Inpatient Hospital Stay: Payer: Commercial Managed Care - HMO

## 2021-08-14 ENCOUNTER — Inpatient Hospital Stay (HOSPITAL_BASED_OUTPATIENT_CLINIC_OR_DEPARTMENT_OTHER): Payer: Commercial Managed Care - HMO | Admitting: Hematology

## 2021-08-14 ENCOUNTER — Other Ambulatory Visit: Payer: Self-pay

## 2021-08-14 VITALS — BP 121/90 | HR 85 | Temp 97.3°F | Resp 16 | Wt 145.2 lb

## 2021-08-14 DIAGNOSIS — D472 Monoclonal gammopathy: Secondary | ICD-10-CM | POA: Diagnosis not present

## 2021-08-14 DIAGNOSIS — D649 Anemia, unspecified: Secondary | ICD-10-CM

## 2021-08-14 DIAGNOSIS — D508 Other iron deficiency anemias: Secondary | ICD-10-CM

## 2021-08-14 LAB — CBC WITH DIFFERENTIAL (CANCER CENTER ONLY)
Abs Immature Granulocytes: 0.03 10*3/uL (ref 0.00–0.07)
Basophils Absolute: 0 10*3/uL (ref 0.0–0.1)
Basophils Relative: 0 %
Eosinophils Absolute: 0.1 10*3/uL (ref 0.0–0.5)
Eosinophils Relative: 1 %
HCT: 32.2 % — ABNORMAL LOW (ref 36.0–46.0)
Hemoglobin: 10.5 g/dL — ABNORMAL LOW (ref 12.0–15.0)
Immature Granulocytes: 0 %
Lymphocytes Relative: 19 %
Lymphs Abs: 1.4 10*3/uL (ref 0.7–4.0)
MCH: 35.7 pg — ABNORMAL HIGH (ref 26.0–34.0)
MCHC: 32.6 g/dL (ref 30.0–36.0)
MCV: 109.5 fL — ABNORMAL HIGH (ref 80.0–100.0)
Monocytes Absolute: 0.6 10*3/uL (ref 0.1–1.0)
Monocytes Relative: 8 %
Neutro Abs: 5.2 10*3/uL (ref 1.7–7.7)
Neutrophils Relative %: 72 %
Platelet Count: 152 10*3/uL (ref 150–400)
RBC: 2.94 MIL/uL — ABNORMAL LOW (ref 3.87–5.11)
RDW: 13 % (ref 11.5–15.5)
WBC Count: 7.4 10*3/uL (ref 4.0–10.5)
nRBC: 0 % (ref 0.0–0.2)

## 2021-08-14 LAB — CMP (CANCER CENTER ONLY)
ALT: 27 U/L (ref 0–44)
AST: 30 U/L (ref 15–41)
Albumin: 3.8 g/dL (ref 3.5–5.0)
Alkaline Phosphatase: 106 U/L (ref 38–126)
Anion gap: 0 — ABNORMAL LOW (ref 5–15)
BUN: 17 mg/dL (ref 8–23)
CO2: 24 mmol/L (ref 22–32)
Calcium: 9.2 mg/dL (ref 8.9–10.3)
Chloride: 110 mmol/L (ref 98–111)
Creatinine: 0.95 mg/dL (ref 0.44–1.00)
GFR, Estimated: >60 mL/min (ref >60)
Glucose, Bld: 96 mg/dL (ref 70–99)
Potassium: 4.9 mmol/L (ref 3.5–5.1)
Sodium: 134 mmol/L — ABNORMAL LOW (ref 135–145)
Total Bilirubin: 0.3 mg/dL (ref 0.3–1.2)
Total Protein: 7.7 g/dL (ref 6.5–8.1)

## 2021-08-14 LAB — IRON AND IRON BINDING CAPACITY (CC-WL,HP ONLY)
Iron: 112 ug/dL (ref 28–170)
Saturation Ratios: 35 % — ABNORMAL HIGH (ref 10.4–31.8)
TIBC: 318 ug/dL (ref 250–450)
UIBC: 206 ug/dL (ref 148–442)

## 2021-08-14 LAB — FERRITIN: Ferritin: 246 ng/mL (ref 11–307)

## 2021-08-14 LAB — VITAMIN B12: Vitamin B-12: 2857 pg/mL — ABNORMAL HIGH (ref 180–914)

## 2021-08-14 NOTE — Progress Notes (Signed)
Per Dr Irene Limbo MDS FISH ordered with pathology on BM biopsy done 07/28/21.

## 2021-08-14 NOTE — Progress Notes (Signed)
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 female who has been referred to [place] by Dr Belva Agee for evaluation and management of anemia. The pt reports that she is doing well overall.  The pt reports that she was first given the diagnoses of anemia about 10 years ago. Pt is currently taking 1 iron pill every other day. Pt had a gastric bypass surgery in 2014. She initially weighed about 400 lbs and is currently 164 lbs. She has been experiencing a severe lack of appetite and weight loss. When she does attempt to eat she can only take a couple of bites and then has a bowel movement soon after that. She also has abdominal pain after she eats. Pt has seasonal allergies and eczema, for which she has been taking Dupixent for 3 months. Pt has never had a skin biopsy to confirm her eczema diagnosis. It was dormant for about 10 years and came back 2 years ago after an interaction with a cat and has persisted. Pt also takes Vistaril to help with the itch. Pt has never had an allergy test or immune therapy testing. Pt took steroid shots for her carpal tunnel. There were pre-cancerous cells found in pt's pap smear and her Gynecologist is planning on doing a resection. Pt was experiencing black stools a couple of months ago, that were not painful and did not speak with her PCP about it. She had a colonoscopy in 2014 and has never had an endoscopy. Pt believes her ankle swelling is from work and wears compression socks which helps.   Most recent lab results (08/12/2018) of CBC is as follows: WBC at 3.8K, RBC at 2.49, Hgb at 7.8, HCT at 24.3, MCV at 98, MCH at 31.3, MCHC at 32.1, RDW at 15.9, Platelets at 182K.  08/12/2018 Transferrin is  286 08/12/2018 Ferritin is 30  08/12/2018 Iron and TIBC shows Iron Bind, Cap, (TIBC) at 349, UIBC at 303, Iron at 46, Iron Sat at 13.   On review of systems, pt reports pain in her left knee, weight loss, fatigue, enlarged cervical lymph nodes, black/bloody stools, abdominal pain and denies vaginal bleeds, nose bleeds gum bleeds, chest pain, SOB, fevers, chills and any other symptoms.   On PMHx the pt reports gastric bypass surgery, two cesarean sections, tubal ligation.  On Social Hx the pt reports that she has quit smoking, no alcohol use.   INTERVAL HISTORY:  Debra Jacobson is a 61 y.o. female here for follow-up after a couple of missed appointments. She reports She is doing well with no new symptoms or concerns.  She reports that she has completely stopped drinking alcohol for the last 50 days and notes that she plans to avoid it long term.  She notes persistent pain in right leg which she prefers to treat with topical creams.  We discussed holding Reticrit and may continue to hold if labs remain stable.  No overt chest pain or shortness of breath. No new bone pains. No other new or acute focal symptoms.  Labs done today were discussed in detail with her.  Molecular pathology, Bone marrow biopsy, and cytogenetics done 07/28/2021 were reviewed in detail.  MEDICAL HISTORY:  Past Medical History:  Diagnosis Date   Allergy  all rhinitis   Dependent edema    Dermatitis    GERD (gastroesophageal reflux disease)    Hypertension     SURGICAL HISTORY: Past Surgical History:  Procedure Laterality Date   CESAREAN SECTION     x2   TUBAL LIGATION      SOCIAL HISTORY: Social History   Socioeconomic History   Marital status: Legally Separated    Spouse name: Not on file   Number of children: 3   Years of education: Not on file   Highest education level: Not on file  Occupational History   Occupation: Travel nurse  Tobacco Use   Smoking status: Former    Types:  Cigarettes    Quit date: 08/29/1991    Years since quitting: 29.9   Smokeless tobacco: Never  Vaping Use   Vaping Use: Never used  Substance and Sexual Activity   Alcohol use: Not Currently    Comment: Patient drinks 2 40oz beers multiple days a week   Drug use: No   Sexual activity: Not on file  Other Topics Concern   Not on file  Social History Narrative   Not on file   Social Determinants of Health   Financial Resource Strain: Not on file  Food Insecurity: Not on file  Transportation Needs: Not on file  Physical Activity: Not on file  Stress: Not on file  Social Connections: Not on file  Intimate Partner Violence: Not on file    FAMILY HISTORY: Family History  Problem Relation Age of Onset   Hyperlipidemia Mother    Non-Hodgkin's lymphoma Father    Diabetes Maternal Grandmother    Colon cancer Neg Hx    Stomach cancer Neg Hx    Esophageal cancer Neg Hx    Colon polyps Neg Hx    Pancreatic cancer Neg Hx     ALLERGIES:  has No Known Allergies.  MEDICATIONS:  Current Outpatient Medications  Medication Sig Dispense Refill   cyanocobalamin (,VITAMIN B-12,) 1000 MCG/ML injection INJECT 1 MLS EVERY WEEK FOR 4 WEEKS THEN INJECT 1 ML MONTHLY THEREAFTER 6 mL 1   diphenoxylate-atropine (LOMOTIL) 2.5-0.025 MG tablet Take 1 tablet by mouth every 6 (six) hours as needed.     DUPIXENT 300 MG/2ML prefilled syringe Inject 300 mg into the skin every 14 (fourteen) days.     epoetin alfa-epbx (RETACRIT) 81157 UNIT/ML injection 10,000 Units as directed.     folic acid (FOLVITE) 1 MG tablet Take 1 tablet (1 mg total) by mouth daily. (Patient not taking: Reported on 05/25/2021) 30 tablet 1   furosemide (LASIX) 40 MG tablet Take 40 mg by mouth as needed.     hydrOXYzine (VISTARIL) 25 MG capsule Take 1-2 capsules (25-50 mg total) by mouth at bedtime as needed. (Patient not taking: Reported on 05/25/2021) 30 capsule 1   meloxicam (MOBIC) 15 MG tablet Take 15 mg by mouth daily.      methocarbamol (ROBAXIN) 500 MG tablet Take 500 mg by mouth 2 (two) times daily.     Pancrelipase, Lip-Prot-Amyl, (CREON) 24000-76000 units CPEP Take 2 capsules (48,000 Units total) by mouth with breakfast, with lunch, and with evening meal AND 1 capsule (24,000 Units total) with snacks. 180 capsule 3   thiamine 100 MG tablet Take 1 tablet (100 mg total) by mouth daily. (Patient not taking: Reported on 05/25/2021) 30 tablet 1   triamcinolone cream (KENALOG) 0.1 % Apply 1 application topically 4 (four) times daily as needed. (Patient not taking: Reported on 05/25/2021) 30 g  0   Vitamin D, Ergocalciferol, (DRISDOL) 1.25 MG (50000 UNIT) CAPS capsule TAKE ONE CAPSULE BY MOUTH WEEKLY 12 capsule 4   No current facility-administered medications for this visit.    REVIEW OF SYSTEMS:   10 Point review of Systems was done is negative except as noted above.  PHYSICAL EXAMINATION: ECOG PERFORMANCE STATUS: 1 - Symptomatic but completely ambulatory  . Vitals:   08/14/21 1146  BP: (!) 121/90  Pulse: 85  Resp: 16  Temp: (!) 97.3 F (36.3 C)  SpO2: 100%    Filed Weights   08/14/21 1146  Weight: 145 lb 3.2 oz (65.9 kg)    .Body mass index is 22.74 kg/m. NAD GENERAL:alert, in no acute distress and comfortable SKIN: no acute rashes, no significant lesions EYES: conjunctiva are pink and non-injected, sclera anicteric NECK: supple, no JVD LYMPH:  no palpable lymphadenopathy in the cervical, axillary or inguinal regions LUNGS: clear to auscultation b/l with normal respiratory effort HEART: regular rate & rhythm ABDOMEN:  normoactive bowel sounds , non tender, not distended. Extremity: no pedal edema PSYCH: alert & oriented x 3 with fluent speech NEURO: no focal motor/sensory deficits  LABORATORY DATA:  I have reviewed the data as listed  .    Latest Ref Rng & Units 08/14/2021   11:03 AM 07/28/2021    8:10 AM 07/10/2021    1:13 PM  CBC  WBC 4.0 - 10.5 K/uL 7.4  6.5  6.1   Hemoglobin 12.0 -  15.0 g/dL 10.5  8.6  7.9   Hematocrit 36.0 - 46.0 % 32.2  27.5  24.8   Platelets 150 - 400 K/uL 152  168  202    . CBC    Component Value Date/Time   WBC 7.4 08/14/2021 1103   WBC 6.5 07/28/2021 0810   RBC 2.94 (L) 08/14/2021 1103   HGB 10.5 (L) 08/14/2021 1103   HCT 32.2 (L) 08/14/2021 1103   HCT 26.5 (L) 02/13/2021 1258   PLT 152 08/14/2021 1103   MCV 109.5 (H) 08/14/2021 1103   MCH 35.7 (H) 08/14/2021 1103   MCHC 32.6 08/14/2021 1103   RDW 13.0 08/14/2021 1103   LYMPHSABS 1.4 08/14/2021 1103   MONOABS 0.6 08/14/2021 1103   EOSABS 0.1 08/14/2021 1103   BASOSABS 0.0 08/14/2021 1103    .    Latest Ref Rng & Units 08/14/2021   11:03 AM 07/10/2021    1:13 PM 06/28/2021   11:29 AM  CMP  Glucose 70 - 99 mg/dL 96  85  98   BUN 8 - 23 mg/dL $Remove'17  14  10   'BSbVyVV$ Creatinine 0.44 - 1.00 mg/dL 0.95  0.87  0.97   Sodium 135 - 145 mmol/L 134  140  137   Potassium 3.5 - 5.1 mmol/L 4.9  3.8  3.8   Chloride 98 - 111 mmol/L 110  116  111   CO2 22 - 32 mmol/L $RemoveB'24  22  19   'CbknVRwc$ Calcium 8.9 - 10.3 mg/dL 9.2  8.7  8.4   Total Protein 6.5 - 8.1 g/dL 7.7  6.3  6.1   Total Bilirubin 0.3 - 1.2 mg/dL 0.3  0.4  0.9   Alkaline Phos 38 - 126 U/L 106  59  73   AST 15 - 41 U/L 30  49  140   ALT 0 - 44 U/L 27  27  75    . Lab Results  Component Value Date   IRON 141 06/28/2021   TIBC 167 (  L) 06/28/2021   IRONPCTSAT 85 (H) 06/28/2021   (Iron and TIBC)  Lab Results  Component Value Date   FERRITIN 346 (H) 06/28/2021   09/22/2018 Bone Marrow Report   09/22/2018 Bone Marrow report    09/22/2018 FISH Analysis    09/22/2018 Cytogenetics  Cytogenetics done 07/28/2021 revealed    Molecular pathology done 07/28/2021 revealed   Bone marrow biopsy done 07/28/2021 revealed "DIAGNOSIS:   BONE MARROW, ASPIRATE, CLOT, CORE:  -Variably cellular bone marrow with plasma cell neoplasm  -See comment   PERIPHERAL BLOOD:  - Macrocytic anemia   COMMENT:   The bone marrow is generally normocellular  for age with increased number  of plasma cells representing 11% of all cells in the aspirate with lack  of large aggregates or sheets in the clot/biopsy sections.  The plasma  cells display kappa light chain restriction consistent with plasma cell  neoplasm.  The background shows trilineage hematopoiesis with generally  nonspecific changes possibly related to history of liver disease,  alcohol consumption, etc.  Nonetheless, correlation with cytogenetic and  FISH studies is recommended."  RADIOGRAPHIC STUDIES: I have personally reviewed the radiological images as listed and agreed with the findings in the report. CT BONE MARROW BIOPSY & ASPIRATION  Result Date: 07/28/2021 CLINICAL DATA:  History of smoldering multiple myeloma and need for bone marrow biopsy to assess status of disease. EXAM: CT GUIDED BONE MARROW ASPIRATION AND BIOPSY ANESTHESIA/SEDATION: Moderate (conscious) sedation was employed during this procedure. A total of Versed 2.0 mg and Fentanyl 100 mcg was administered intravenously by radiology nursing. Moderate Sedation Time: 11 minutes. The patient's level of consciousness and vital signs were monitored continuously by radiology nursing throughout the procedure under my direct supervision. PROCEDURE: The procedure risks, benefits, and alternatives were explained to the patient. Questions regarding the procedure were encouraged and answered. The patient understands and consents to the procedure. A time out was performed prior to initiating the procedure. The right gluteal region was prepped with chlorhexidine. Sterile gown and sterile gloves were used for the procedure. Local anesthesia was provided with 1% Lidocaine. Under CT guidance, an 11 gauge On Control bone cutting needle was advanced from a posterior approach into the right iliac bone. Needle positioning was confirmed with CT. Initial non heparinized and heparinized aspirate samples were obtained of bone marrow. Core biopsy was  performed via the On Control drill needle. COMPLICATIONS: None FINDINGS: Inspection of initial aspirate did reveal visible particles. Intact core biopsy sample was obtained. IMPRESSION: CT guided bone marrow biopsy of right posterior iliac bone with both aspirate and core samples obtained. Electronically Signed   By: Aletta Edouard M.D.   On: 07/28/2021 11:21    ASSESSMENT & PLAN:   1) significant macrocytic anemia likely multifactorial but significant concern for liver cirrhosis and alcohol abuse.  Other factors Vitamin B12 deficiency and other potential nutritional deficiencyes caused by Gastric bypass surgery . Also had dyserythropoiesis on BM Bx.  Currently active significant alcohol abuse and liver disease are also significant contributing factors. 2) Iron deficiency- GI blood loss vs poor absorption related to Gastric bypass surgery. 09/17/2018 Occult blood card to lab is "POSITIVE" x2.  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  Recent admission with confusion and hepatic encephalopathy. 6) noncompliance with medical follow-up 7) Leg swelling -Down with diuresis. -[place] results not suggestive of DVT  PLAN: Patient's labs done today were discussed in detail with her CBC shows anemia with a drop in hemoglobin to 10.5 MCV 109.5 platelet count of 152k and WBC count of 7.4k Iron labs adequate B12 elevated myeloma panel shows stable M spike of 1.3 g/dL Patient has declined PRBC  transfusions completely due to her faith is Jehovah's Witness and would not want PRBC transfusions even under life-threatening situations. [place] abd done 06/13/2021 revealed minor gallbladder wall thickening with indeterminate etiology. [place] elastography done 07/10/2021 revealed findings suggestive of cirrhosis with trace ascites. Bone marrow biopsy done 07/28/2021 revealed cellular bone marrow with plasma cell neoplasm with plasma cells at 11%. Macrocytic anemia.  Continue vitamin B complex  Myeloma panel shows stable M spike of 1.3 pending g/dL which is similar to about a year ago and does not show any overt signs of myeloma progression. She will continue to follow with her gastroenterologist for management of alcohol-related liver disease and her chronic diarrhea. -She reports that she has completely stopped drinking alcohol for the last 50 days and notes that she plans to avoid it long term. -We discussed holding Reticrit and may continue to hold if labs remain stable.   FOLLOW UP: Phone visit with Dr Irene Limbo in 6 weeks Labs 1 day prior to phone visit  The total time spent in the appointment was 32 minutes*.  All of the patient's questions were answered with apparent satisfaction. The patient knows to call the clinic with any problems, questions or concerns.   Sullivan Lone MD MS AAHIVMS [facility] Hematology/Oncology Physician [facility]  .*Total Encounter Time as defined by the Centers for Medicare and Medicaid Services includes, in addition to the face-to-face time of a patient visit (documented in the note above) non-face-to-face time: obtaining and reviewing outside history, ordering and reviewing medications, tests or procedures, care coordination (communications with other health care professionals or caregivers) and documentation in the medical record.  I, Melene Muller, am acting as scribe for Dr. Sullivan Lone, MD.  .I have reviewed the above documentation for accuracy and  completeness, and I agree with the above. Brunetta Genera MD

## 2021-08-15 LAB — ERYTHROPOIETIN: Erythropoietin: 115.3 m[IU]/mL — ABNORMAL HIGH (ref 2.6–18.5)

## 2021-08-15 LAB — KAPPA/LAMBDA LIGHT CHAINS
Kappa free light chain: 112.7 mg/L — ABNORMAL HIGH (ref 3.3–19.4)
Kappa, lambda light chain ratio: 4.76 — ABNORMAL HIGH (ref 0.26–1.65)
Lambda free light chains: 23.7 mg/L (ref 5.7–26.3)

## 2021-08-16 ENCOUNTER — Telehealth: Payer: Self-pay | Admitting: Hematology

## 2021-08-16 LAB — MULTIPLE MYELOMA PANEL, SERUM
Albumin SerPl Elph-Mcnc: 3.5 g/dL (ref 2.9–4.4)
Albumin/Glob SerPl: 1 (ref 0.7–1.7)
Alpha 1: 0.2 g/dL (ref 0.0–0.4)
Alpha2 Glob SerPl Elph-Mcnc: 0.7 g/dL (ref 0.4–1.0)
B-Globulin SerPl Elph-Mcnc: 0.8 g/dL (ref 0.7–1.3)
Gamma Glob SerPl Elph-Mcnc: 2.1 g/dL — ABNORMAL HIGH (ref 0.4–1.8)
Globulin, Total: 3.8 g/dL (ref 2.2–3.9)
IgA: 47 mg/dL — ABNORMAL LOW (ref 87–352)
IgG (Immunoglobin G), Serum: 2145 mg/dL — ABNORMAL HIGH (ref 586–1602)
IgM (Immunoglobulin M), Srm: 455 mg/dL — ABNORMAL HIGH (ref 26–217)
M Protein SerPl Elph-Mcnc: 1.3 g/dL — ABNORMAL HIGH
Total Protein ELP: 7.3 g/dL (ref 6.0–8.5)

## 2021-08-16 NOTE — Telephone Encounter (Signed)
Scheduled follow-up appointments per 8/14 los. Patient is aware.

## 2021-08-17 ENCOUNTER — Encounter (HOSPITAL_COMMUNITY): Payer: Self-pay | Admitting: Hematology

## 2021-08-20 ENCOUNTER — Encounter: Payer: Self-pay | Admitting: Hematology

## 2021-08-21 ENCOUNTER — Other Ambulatory Visit (INDEPENDENT_AMBULATORY_CARE_PROVIDER_SITE_OTHER): Payer: Commercial Managed Care - HMO

## 2021-08-21 ENCOUNTER — Telehealth: Payer: Self-pay

## 2021-08-21 ENCOUNTER — Other Ambulatory Visit: Payer: Self-pay

## 2021-08-21 ENCOUNTER — Ambulatory Visit
Admission: RE | Admit: 2021-08-21 | Discharge: 2021-08-21 | Disposition: A | Discharge status: Home / Self Care | Payer: Commercial Managed Care - HMO | Source: Ambulatory Visit | Attending: Internal Medicine | Admitting: Internal Medicine

## 2021-08-21 ENCOUNTER — Telehealth: Payer: Self-pay | Admitting: Internal Medicine

## 2021-08-21 DIAGNOSIS — K7460 Unspecified cirrhosis of liver: Secondary | ICD-10-CM

## 2021-08-21 DIAGNOSIS — Z1231 Encounter for screening mammogram for malignant neoplasm of breast: Secondary | ICD-10-CM

## 2021-08-21 LAB — PROTIME-INR
INR: 1.1 ratio — ABNORMAL HIGH (ref 0.8–1.0)
Prothrombin Time: 12 s (ref 9.6–13.1)

## 2021-08-21 NOTE — Telephone Encounter (Signed)
Spoke with pt and let her know she can come by anytime Monday - Friday 7:30 am - 5 pm for labs. Pt is scheduled for EGD on 08/28/21 at 4 pm. Ambulatory referral to GI placed. Per pt request instructions placed at front desk on second floor for pt to pick up when she comes in for labs and instructions also sent to mychart.

## 2021-08-21 NOTE — Telephone Encounter (Signed)
From: Willia Craze, NP  Sent: 08/18/2021   3:44 PM EDT  To: Marice Potter, RN  Please add  HCV RNA PCR ( viral load) and an INR.  The EGD can be scheduled at [facility] with Dr. Hilarie Fredrickson. Dx: cirrhosis, varices screening.   Thanks

## 2021-08-21 NOTE — Telephone Encounter (Signed)
-----   Message from Willia Craze, NP sent at 08/18/2021  3:40 PM EDT ----- OK, good. She will need: AMA Anti-smooth muscle antibody Hepatitis A total, Hepatitis B surface antigen, Hepatitis B surface antibody,  Hepatitis B core ab HCV antibody Alpha 1 antitrypsin Ceruloplasmin tTG, total IgA    ----- Message ----- From: Marice Potter, RN Sent: 08/16/2021   4:57 PM EDT To: Willia Craze, NP  I spoke with the pt and she was agreeable to getting labs and completing the EGD.   ----- Message ----- From: Willia Craze, NP Sent: 08/16/2021   4:48 PM EDT To: Marice Potter, RN  Mickel Baas,  Please let Kampbell know that I got records from [place] but either she didn't have the labs we need or they didn't send them. Anyway, she needs labs looking for cause of cirrhosis and also I had recommended an EGD for varices screening but she declined. If she is agreeable to get labs let me know and I will tell you what all she needs. Also , please find out if she has changed her mind about the EGD.  Thanks, pg

## 2021-08-21 NOTE — Telephone Encounter (Signed)
Spoke with pt. See separate 8/21 telephone encounter.

## 2021-08-21 NOTE — Telephone Encounter (Signed)
Patient called, states she would like to schedule EGD and lab appointment. Please call to follow

## 2021-08-24 LAB — CERULOPLASMIN: Ceruloplasmin: 18 mg/dL (ref 18–53)

## 2021-08-24 LAB — HEPATITIS A ANTIBODY, TOTAL: Hepatitis A AB,Total: NONREACTIVE

## 2021-08-24 LAB — HEPATITIS C RNA QUANTITATIVE
HCV Quantitative Log: <1.18 log IU/mL
HCV RNA, PCR, QN: <15 IU/mL

## 2021-08-24 LAB — HEPATITIS B SURFACE ANTIBODY,QUALITATIVE: Hep B S Ab: REACTIVE — AB

## 2021-08-24 LAB — HEPATITIS B CORE ANTIBODY, TOTAL: Hep B Core Total Ab: NONREACTIVE

## 2021-08-24 LAB — ANTI-SMOOTH MUSCLE ANTIBODY, IGG: Actin (Smooth Muscle) Antibody (IGG): <20 U (ref <20)

## 2021-08-24 LAB — TISSUE TRANSGLUTAMINASE ABS,IGG,IGA
(tTG) Ab, IgA: <1 U/mL
(tTG) Ab, IgG: <1 U/mL

## 2021-08-24 LAB — IGA: Immunoglobulin A: 49 mg/dL — ABNORMAL LOW (ref 70–320)

## 2021-08-24 LAB — MITOCHONDRIAL ANTIBODIES: Mitochondrial M2 Ab, IgG: <=20 U (ref <=20.0)

## 2021-08-24 LAB — HEPATITIS B SURFACE ANTIGEN: Hepatitis B Surface Ag: NONREACTIVE

## 2021-08-24 LAB — HEPATITIS C ANTIBODY: Hepatitis C Ab: REACTIVE — AB

## 2021-08-24 LAB — ALPHA-1-ANTITRYPSIN: A-1 Antitrypsin, Ser: 123 mg/dL (ref 83–199)

## 2021-08-28 ENCOUNTER — Ambulatory Visit (AMBULATORY_SURGERY_CENTER): Payer: Commercial Managed Care - HMO | Admitting: Internal Medicine

## 2021-08-28 ENCOUNTER — Encounter: Payer: Self-pay | Admitting: Internal Medicine

## 2021-08-28 VITALS — BP 127/85 | HR 55 | Temp 97.8°F | Resp 13 | Ht 67.0 in | Wt 156.0 lb

## 2021-08-28 DIAGNOSIS — K2100 Gastro-esophageal reflux disease with esophagitis, without bleeding: Secondary | ICD-10-CM

## 2021-08-28 DIAGNOSIS — K7460 Unspecified cirrhosis of liver: Secondary | ICD-10-CM

## 2021-08-28 DIAGNOSIS — K319 Disease of stomach and duodenum, unspecified: Secondary | ICD-10-CM

## 2021-08-28 DIAGNOSIS — K2970 Gastritis, unspecified, without bleeding: Secondary | ICD-10-CM | POA: Diagnosis not present

## 2021-08-28 MED ORDER — SODIUM CHLORIDE 0.9 % IV SOLN: 500.0000 mL | Freq: Once | INTRAVENOUS | Status: DC

## 2021-08-28 MED ORDER — OMEPRAZOLE 40 MG PO CPDR: 40.0000 mg | DELAYED_RELEASE_CAPSULE | Freq: Every day | ORAL | 5 refills | Status: DC

## 2021-08-28 NOTE — Op Note (Signed)
[place] Patient Name: Debra Jacobson Procedure Date: 08/28/2021 4:14 PM MRN: [id number] Endoscopist: Jerene Bears , MD Age: 61 Referring MD:  Date of Birth: Mar 11, 1960 Gender: Female Account #: [id number] Procedure:                Upper GI endoscopy Indications:              Cirrhosis rule out esophageal varices Medicines:                Monitored Anesthesia Care Procedure:                Pre-Anesthesia Assessment:                           - Prior to the procedure, a History and Physical                            was performed, and patient medications and                            allergies were reviewed. The patient's tolerance of                            previous anesthesia was also reviewed. The risks                            and benefits of the procedure and the sedation                            options and risks were discussed with the patient.                            All questions were answered, and informed consent                            was obtained. Prior Anticoagulants: The patient has                            taken no previous anticoagulant or antiplatelet                            agents. ASA Grade Assessment: II - A patient with                            mild systemic disease. After reviewing the risks                            and benefits, the patient was deemed in                            satisfactory condition to undergo the procedure.                           After obtaining informed consent, the endoscope was  passed under direct vision. Throughout the                            procedure, the patient's blood pressure, pulse, and                            oxygen saturations were monitored continuously. The                            GIF [id number] #[id number] was introduced through the                            mouth, and advanced to the efferent jejunal loop.                            The upper GI endoscopy  was accomplished without                            difficulty. The patient tolerated the procedure                            well. Scope In: Scope Out: Findings:                 LA Grade C (one or more mucosal breaks continuous                            between tops of 2 or more mucosal folds, less than                            75% circumference) esophagitis with no bleeding was                            found 33 to 35 cm from the incisors.                           Evidence of a Roux-en-Y gastrojejunostomy was                            found. The gastrojejunal anastomosis was                            characterized by healthy appearing mucosa. This was                            traversed. The pouch-to-jejunum limb was                            characterized by erythema. The duodenum-to-jejunum                            limb was not examined as it could not be reached.  Biopsies were taken from gastric pouch with a cold                            forceps for histology and Helicobacter pylori                            testing.                           The examined jejunum was normal. Complications:            No immediate complications. Estimated Blood Loss:     Estimated blood loss was minimal. Impression:               - LA Grade C reflux esophagitis with no bleeding.                           - No esophageal or gastric varices.                           - Roux-en-Y gastrojejunostomy with gastrojejunal                            anastomosis characterized by healthy appearing                            mucosa. Mild gastritis, biopsied.                           - Normal examined jejunum.                           - No specimens collected. Recommendation:           - Patient has a contact number available for                            emergencies. The signs and symptoms of potential                            delayed complications were discussed  with the                            patient. Return to normal activities tomorrow.                            Written discharge instructions were provided to the                            patient.                           - Resume previous diet. Continue strict alcohol                            avoidance.                           -  Continue present medications. Add omeprazole 40                            mg once daily (open capsules and take with                            applesauce given gastric bypass status).                           - Await pathology results.                           - Repeat upper endoscopy in 2 years for screening                            purposes. Jerene Bears, MD 08/28/2021 4:29:42 PM This report has been signed electronically.

## 2021-08-28 NOTE — Progress Notes (Signed)
GASTROENTEROLOGY PROCEDURE H&P NOTE   Primary Care Physician: Nolene Ebbs, MD    Reason for Procedure:  Concern for cirrhosis, rule out portal hypertensive changes including varices and gastropathy  Plan:    EGD  Patient is appropriate for endoscopic procedure(s) in the ambulatory ([facility]) setting.  The nature of the procedure, as well as the risks, benefits, and alternatives were carefully and thoroughly reviewed with the patient. Ample time for discussion and questions allowed. The patient understood, was satisfied, and agreed to proceed.     HPI: Debra Jacobson is a 61 y.o. female who presents for upper endoscopy.  Medical history as below.   No recent chest pain or shortness of breath.  No abdominal pain today.  Past Medical History:  Diagnosis Date   Allergy    all rhinitis   Dependent edema    Dermatitis    GERD (gastroesophageal reflux disease)    Hypertension     Past Surgical History:  Procedure Laterality Date   CESAREAN SECTION     x2   TUBAL LIGATION      Prior to Admission medications   Medication Sig Start Date End Date Taking? Authorizing Provider  furosemide (LASIX) 40 MG tablet Take 40 mg by mouth as needed.   Yes [provider]  meloxicam (MOBIC) 15 MG tablet Take 15 mg by mouth daily.   Yes [provider]  methocarbamol (ROBAXIN) 500 MG tablet Take 500 mg by mouth 2 (two) times daily. 04/11/21  Yes [provider]  Pancrelipase, Lip-Prot-Amyl, (CREON) 24000-76000 units CPEP Take 2 capsules (48,000 Units total) by mouth with breakfast, with lunch, and with evening meal AND 1 capsule (24,000 Units total) with snacks. 06/21/21  Yes Willia Craze, NP  Vitamin D, Ergocalciferol, (DRISDOL) 1.25 MG (50000 UNIT) CAPS capsule TAKE ONE CAPSULE BY MOUTH WEEKLY 08/22/20  Yes Brunetta Genera, MD  cyanocobalamin (,VITAMIN B-12,) 1000 MCG/ML injection INJECT 1 MLS EVERY WEEK FOR 4 WEEKS THEN INJECT 1 ML MONTHLY THEREAFTER 03/01/21    Brunetta Genera, MD  diphenoxylate-atropine (LOMOTIL) 2.5-0.025 MG tablet Take 1 tablet by mouth every 6 (six) hours as needed. 06/22/21   [provider]  DUPIXENT 300 MG/2ML prefilled syringe Inject 300 mg into the skin every 14 (fourteen) days. 04/09/19   [provider]  epoetin alfa-epbx (RETACRIT) 53976 UNIT/ML injection 10,000 Units as directed.    [provider]  folic acid (FOLVITE) 1 MG tablet Take 1 tablet (1 mg total) by mouth daily. Patient not taking: Reported on 05/25/2021 01/31/21   Sherrell Puller, PA-C  hydrOXYzine (VISTARIL) 25 MG capsule Take 1-2 capsules (25-50 mg total) by mouth at bedtime as needed. Patient not taking: Reported on 05/25/2021 08/05/20   Brunetta Genera, MD  thiamine 100 MG tablet Take 1 tablet (100 mg total) by mouth daily. Patient not taking: Reported on 05/25/2021 01/31/21   Sherrell Puller, PA-C  triamcinolone cream (KENALOG) 0.1 % Apply 1 application topically 4 (four) times daily as needed. Patient not taking: Reported on 05/25/2021 05/22/16   Clayton Bibles, PA-C    Current Outpatient Medications  Medication Sig Dispense Refill   furosemide (LASIX) 40 MG tablet Take 40 mg by mouth as needed.     meloxicam (MOBIC) 15 MG tablet Take 15 mg by mouth daily.     methocarbamol (ROBAXIN) 500 MG tablet Take 500 mg by mouth 2 (two) times daily.     Pancrelipase, Lip-Prot-Amyl, (CREON) 24000-76000 units CPEP Take 2 capsules (48,000  Units total) by mouth with breakfast, with lunch, and with evening meal AND 1 capsule (24,000 Units total) with snacks. 180 capsule 3   Vitamin D, Ergocalciferol, (DRISDOL) 1.25 MG (50000 UNIT) CAPS capsule TAKE ONE CAPSULE BY MOUTH WEEKLY 12 capsule 4   cyanocobalamin (,VITAMIN B-12,) 1000 MCG/ML injection INJECT 1 MLS EVERY WEEK FOR 4 WEEKS THEN INJECT 1 ML MONTHLY THEREAFTER 6 mL 1   diphenoxylate-atropine (LOMOTIL) 2.5-0.025 MG tablet Take 1 tablet by mouth every 6 (six) hours as needed.     DUPIXENT 300  MG/2ML prefilled syringe Inject 300 mg into the skin every 14 (fourteen) days.     epoetin alfa-epbx (RETACRIT) 62376 UNIT/ML injection 10,000 Units as directed.     folic acid (FOLVITE) 1 MG tablet Take 1 tablet (1 mg total) by mouth daily. (Patient not taking: Reported on 05/25/2021) 30 tablet 1   hydrOXYzine (VISTARIL) 25 MG capsule Take 1-2 capsules (25-50 mg total) by mouth at bedtime as needed. (Patient not taking: Reported on 05/25/2021) 30 capsule 1   thiamine 100 MG tablet Take 1 tablet (100 mg total) by mouth daily. (Patient not taking: Reported on 05/25/2021) 30 tablet 1   triamcinolone cream (KENALOG) 0.1 % Apply 1 application topically 4 (four) times daily as needed. (Patient not taking: Reported on 05/25/2021) 30 g 0   Current Facility-Administered Medications  Medication Dose Route Frequency Provider Last Rate Last Admin   0.9 %  sodium chloride infusion  500 mL Intravenous Once Aanvi Voyles, Lajuan Lines, MD        Allergies as of 08/28/2021   (No Known Allergies)    Family History  Problem Relation Age of Onset   Hyperlipidemia Mother    Non-Hodgkin's lymphoma Father    Diabetes Maternal Grandmother    Colon cancer Neg Hx    Stomach cancer Neg Hx    Esophageal cancer Neg Hx    Colon polyps Neg Hx    Pancreatic cancer Neg Hx    Breast cancer Neg Hx     Social History   Socioeconomic History   Marital status: Legally Separated    Spouse name: Not on file   Number of children: 3   Years of education: Not on file   Highest education level: Not on file  Occupational History   Occupation: Travel nurse  Tobacco Use   Smoking status: Former    Types: Cigarettes    Quit date: 08/29/1991    Years since quitting: 30.0   Smokeless tobacco: Never  Vaping Use   Vaping Use: Never used  Substance and Sexual Activity   Alcohol use: Not Currently    Comment: Patient drinks 2 40oz beers multiple days a week   Drug use: No   Sexual activity: Not on file  Other Topics Concern   Not on  file  Social History Narrative   Not on file   Social Determinants of Health   Financial Resource Strain: Not on file  Food Insecurity: Not on file  Transportation Needs: Not on file  Physical Activity: Not on file  Stress: Not on file  Social Connections: Not on file  Intimate Partner Violence: Not on file    Physical Exam: Vital signs in last 24 hours: '@BP'$  118/69   Pulse 60   Temp 97.8 F (36.6 C) (Temporal)   Ht '5\' 7"'$  (1.702 m)   Wt 156 lb (70.8 kg)   LMP 04/14/2012   SpO2 100%   BMI 24.43 kg/m  GEN: NAD EYE: Sclerae  anicteric ENT: MMM CV: Non-tachycardic Pulm: CTA b/l GI: Soft, NT/ND NEURO:  Alert & Oriented x 3   Zenovia Jarred, MD [facility]  08/28/2021 4:13 PM

## 2021-08-28 NOTE — Progress Notes (Signed)
Called to room to assist during endoscopic procedure.  Patient ID and intended procedure confirmed with present staff. Received instructions for my participation in the procedure from the performing physician.  

## 2021-08-28 NOTE — Patient Instructions (Signed)
Resume previous medications.  Await results for final recommendations.    Handouts on findings given to patient.     Omeprazole 40 mg once daily, open capsules and take with applesauce  YOU HAD AN ENDOSCOPIC PROCEDURE TODAY AT [facility]:   Refer to the procedure report that was given to you for any specific questions about what was found during the examination.  If the procedure report does not answer your questions, please call your gastroenterologist to clarify.  If you requested that your care partner not be given the details of your procedure findings, then the procedure report has been included in a sealed envelope for you to review at your convenience later.  YOU SHOULD EXPECT: Some feelings of bloating in the abdomen. Passage of more gas than usual.  Walking can help get rid of the air that was put into your GI tract during the procedure and reduce the bloating. If you had a lower endoscopy (such as a colonoscopy or flexible sigmoidoscopy) you may notice spotting of blood in your stool or on the toilet paper. If you underwent a bowel prep for your procedure, you may not have a normal bowel movement for a few days.  Please Note:  You might notice some irritation and congestion in your nose or some drainage.  This is from the oxygen used during your procedure.  There is no need for concern and it should clear up in a day or so.  SYMPTOMS TO REPORT IMMEDIATELY:   Following upper endoscopy (EGD)  Vomiting of blood or coffee ground material  New chest pain or pain under the shoulder blades  Painful or persistently difficult swallowing  New shortness of breath  Fever of 100F or higher  Black, tarry-looking stools  For urgent or emergent issues, a gastroenterologist can be reached at any hour by calling [phone]. Do not use MyChart messaging for urgent concerns.    DIET:  We do recommend a small meal at first, but then you may proceed to your regular diet.  Drink  plenty of fluids but you should avoid alcoholic beverages for 24 hours.  ACTIVITY:  You should plan to take it easy for the rest of today and you should NOT DRIVE or use heavy machinery until tomorrow (because of the sedation medicines used during the test).    FOLLOW UP: Our staff will call the number listed on your records the next business day following your procedure.  We will call around 7:15- 8:00 am to check on you and address any questions or concerns that you may have regarding the information given to you following your procedure. If we do not reach you, we will leave a message.  If you develop any symptoms (ie: fever, flu-like symptoms, shortness of breath, cough etc.) before then, please call [phone].  If you test positive for Covid 19 in the 2 weeks post procedure, please call and report this information to [place].    If any biopsies were taken you will be contacted by phone or by letter within the next 1-3 weeks.  Please call us at [phone] if you have not heard about the biopsies in 3 weeks.    SIGNATURES/CONFIDENTIALITY: You and/or your care partner have signed paperwork which will be entered into your electronic medical record.  These signatures attest to the fact that that the information above on your After Visit Summary has been reviewed and is understood.  Full responsibility of the confidentiality of this discharge information  lies with you and/or your care-partner.  

## 2021-08-28 NOTE — Progress Notes (Signed)
Report to PACU, RN, vss, BBS= Clear.  

## 2021-08-28 NOTE — Progress Notes (Signed)
Pt's states no medical or surgical changes since previsit or office visit. 

## 2021-08-29 ENCOUNTER — Telehealth: Payer: Self-pay

## 2021-08-29 NOTE — Telephone Encounter (Signed)
No answer, left message to call if having any issues or concerns, B.Hoda Hon RN 

## 2021-08-31 ENCOUNTER — Encounter: Payer: Self-pay | Admitting: Internal Medicine

## 2021-09-15 ENCOUNTER — Other Ambulatory Visit: Payer: Self-pay | Admitting: Hematology

## 2021-09-21 ENCOUNTER — Encounter: Payer: Self-pay | Admitting: Hematology

## 2021-09-22 ENCOUNTER — Other Ambulatory Visit: Payer: Self-pay

## 2021-09-22 DIAGNOSIS — D472 Monoclonal gammopathy: Secondary | ICD-10-CM

## 2021-09-22 DIAGNOSIS — D649 Anemia, unspecified: Secondary | ICD-10-CM

## 2021-09-25 ENCOUNTER — Inpatient Hospital Stay: Payer: Commercial Managed Care - HMO | Attending: Hematology

## 2021-09-25 ENCOUNTER — Other Ambulatory Visit: Payer: Self-pay

## 2021-09-25 DIAGNOSIS — E538 Deficiency of other specified B group vitamins: Secondary | ICD-10-CM | POA: Insufficient documentation

## 2021-09-25 DIAGNOSIS — D472 Monoclonal gammopathy: Secondary | ICD-10-CM | POA: Insufficient documentation

## 2021-09-25 DIAGNOSIS — D508 Other iron deficiency anemias: Secondary | ICD-10-CM | POA: Diagnosis present

## 2021-09-25 DIAGNOSIS — D649 Anemia, unspecified: Secondary | ICD-10-CM

## 2021-09-25 LAB — CBC WITH DIFFERENTIAL (CANCER CENTER ONLY)
Abs Immature Granulocytes: 0.02 10*3/uL (ref 0.00–0.07)
Basophils Absolute: 0 10*3/uL (ref 0.0–0.1)
Basophils Relative: 0 %
Eosinophils Absolute: 0.1 10*3/uL (ref 0.0–0.5)
Eosinophils Relative: 2 %
HCT: 32.8 % — ABNORMAL LOW (ref 36.0–46.0)
Hemoglobin: 10.7 g/dL — ABNORMAL LOW (ref 12.0–15.0)
Immature Granulocytes: 0 %
Lymphocytes Relative: 25 %
Lymphs Abs: 1.6 10*3/uL (ref 0.7–4.0)
MCH: 33.6 pg (ref 26.0–34.0)
MCHC: 32.6 g/dL (ref 30.0–36.0)
MCV: 103.1 fL — ABNORMAL HIGH (ref 80.0–100.0)
Monocytes Absolute: 0.6 10*3/uL (ref 0.1–1.0)
Monocytes Relative: 9 %
Neutro Abs: 3.9 10*3/uL (ref 1.7–7.7)
Neutrophils Relative %: 64 %
Platelet Count: 140 10*3/uL — ABNORMAL LOW (ref 150–400)
RBC: 3.18 MIL/uL — ABNORMAL LOW (ref 3.87–5.11)
RDW: 12.4 % (ref 11.5–15.5)
WBC Count: 6.1 10*3/uL (ref 4.0–10.5)
nRBC: 0 % (ref 0.0–0.2)

## 2021-09-25 LAB — CMP (CANCER CENTER ONLY)
ALT: 29 U/L (ref 0–44)
AST: 26 U/L (ref 15–41)
Albumin: 3.7 g/dL (ref 3.5–5.0)
Alkaline Phosphatase: 167 U/L — ABNORMAL HIGH (ref 38–126)
Anion gap: 2 — ABNORMAL LOW (ref 5–15)
BUN: 22 mg/dL (ref 8–23)
CO2: 25 mmol/L (ref 22–32)
Calcium: 9.2 mg/dL (ref 8.9–10.3)
Chloride: 108 mmol/L (ref 98–111)
Creatinine: 0.97 mg/dL (ref 0.44–1.00)
GFR, Estimated: >60 mL/min (ref >60)
Glucose, Bld: 99 mg/dL (ref 70–99)
Potassium: 4.6 mmol/L (ref 3.5–5.1)
Sodium: 135 mmol/L (ref 135–145)
Total Bilirubin: 0.2 mg/dL — ABNORMAL LOW (ref 0.3–1.2)
Total Protein: 7.7 g/dL (ref 6.5–8.1)

## 2021-09-25 LAB — RETICULOCYTES
Immature Retic Fract: 14.4 % (ref 2.3–15.9)
RBC.: 3.21 MIL/uL — ABNORMAL LOW (ref 3.87–5.11)
Retic Count, Absolute: 44.6 10*3/uL (ref 19.0–186.0)
Retic Ct Pct: 1.4 % (ref 0.4–3.1)

## 2021-09-25 LAB — IRON AND IRON BINDING CAPACITY (CC-WL,HP ONLY)
Iron: 78 ug/dL (ref 28–170)
Saturation Ratios: 24 % (ref 10.4–31.8)
TIBC: 322 ug/dL (ref 250–450)
UIBC: 244 ug/dL (ref 148–442)

## 2021-09-25 LAB — VITAMIN B12: Vitamin B-12: 379 pg/mL (ref 180–914)

## 2021-09-25 LAB — FERRITIN: Ferritin: 116 ng/mL (ref 11–307)

## 2021-09-26 ENCOUNTER — Other Ambulatory Visit: Payer: Self-pay | Admitting: Hematology

## 2021-09-26 ENCOUNTER — Inpatient Hospital Stay (HOSPITAL_BASED_OUTPATIENT_CLINIC_OR_DEPARTMENT_OTHER): Payer: Commercial Managed Care - HMO | Admitting: Hematology

## 2021-09-26 DIAGNOSIS — D508 Other iron deficiency anemias: Secondary | ICD-10-CM | POA: Diagnosis not present

## 2021-09-26 DIAGNOSIS — D649 Anemia, unspecified: Secondary | ICD-10-CM | POA: Diagnosis not present

## 2021-09-26 MED ORDER — GABAPENTIN 300 MG PO CAPS: 300.0000 mg | ORAL_CAPSULE | Freq: Every day | ORAL | 1 refills | Status: DC

## 2021-09-26 NOTE — Progress Notes (Signed)
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 female who has been referred to [place] by Dr Lucillie Garfinkel for evaluation and management of anemia. The pt reports that she is doing well overall.  The pt reports that she was first given the diagnoses of anemia about 10 years ago. Pt is currently taking 1 iron pill every other day. Pt had a gastric bypass surgery in 2014. She initially weighed about 400 lbs and is currently 164 lbs. She has been experiencing a severe lack of appetite and weight loss. When she does attempt to eat she can only take a couple of bites and then has a bowel movement soon after that. She also has abdominal pain after she eats. Pt has seasonal allergies and eczema, for which she has been taking Dupixent for 3 months. Pt has never had a skin biopsy to confirm her eczema diagnosis. It was dormant for about 10 years and came back 2 years ago after an interaction with a cat and has persisted. Pt also takes Vistaril to help with the itch. Pt has never had an allergy test or immune therapy testing. Pt took steroid shots for her carpal tunnel. There were pre-cancerous cells found in pt's pap smear and her Gynecologist is planning on doing a resection. Pt was experiencing black stools a couple of months ago, that were not painful and did not speak with her PCP about it. She had a colonoscopy in 2014 and has never had an endoscopy. Pt believes her ankle swelling is from work and wears compression socks which helps.   Most recent lab results (08/12/2018) of CBC is as follows: WBC at 3.8K, RBC at 2.49, Hgb at 7.8, HCT at 24.3, MCV at 98, MCH at 31.3, MCHC at 32.1, RDW at 15.9, Platelets at 182K.  08/12/2018 Transferrin is  286 08/12/2018 Ferritin is 30  08/12/2018 Iron and TIBC shows Iron Bind, Cap, (TIBC) at 349, UIBC at 303, Iron at 46, Iron Sat at 13.   On review of systems, pt reports pain in her left knee, weight loss, fatigue, enlarged cervical lymph nodes, black/bloody stools, abdominal pain and denies vaginal bleeds, nose bleeds gum bleeds, chest pain, SOB, fevers, chills and any other symptoms.   On PMHx the pt reports gastric bypass surgery, two cesarean sections, tubal ligation.  On Social Hx the pt reports that she has quit smoking, no alcohol use.   INTERVAL HISTORY:  I connected with Debra Jacobson on 09/26/2021 at 3:30 PM EST by telephone visit and verified that I am speaking with the correct person using two identifiers.   I discussed the limitations, risks, security and privacy concerns of performing an evaluation and management service by telemedicine and the availability of in-person appointments. I also discussed with the patient that there may be a patient responsible charge related to this service. The patient expressed understanding and agreed to proceed.   Other persons participating in the visit and their role in the encounter: None  Patient's location: Home Provider's location: [place] is a 61 y.o. female who was contacted via phone for follow up for smoldering myeloma and multifactorial anemia. She reports She is doing well with no new symptoms or concerns.  We discussed that due to improving counts she will not need Retacrit shots for the time being.  She reports 88 days of not drinking alcohol.  We discussed continuing Vitamin B12 shot 1x per month.  She notes persistent leg pain that occurs more in the evening and she describes feels like a Charlie horse. We discussed trying Gabapentin and holding robaxin.  No overt chest pain or shortness of breath. No new bone pains. No other new or acute focal symptoms.  Labs done today were discussed  in detail with her.  MEDICAL HISTORY:  Past Medical History:  Diagnosis Date   Allergy    all rhinitis   Dependent edema    Dermatitis    GERD (gastroesophageal reflux disease)    Hypertension     SURGICAL HISTORY: Past Surgical History:  Procedure Laterality Date   CESAREAN SECTION     x2   TUBAL LIGATION      SOCIAL HISTORY: Social History   Socioeconomic History   Marital status: Legally Separated    Spouse name: Not on file   Number of children: 3   Years of education: Not on file   Highest education level: Not on file  Occupational History   Occupation: Travel nurse  Tobacco Use   Smoking status: Former    Types: Cigarettes    Quit date: 08/29/1991    Years since quitting: 30.0   Smokeless tobacco: Never  Vaping Use   Vaping Use: Never used  Substance and Sexual Activity   Alcohol use: Not Currently    Comment: Patient drinks 2 40oz beers multiple days a week   Drug use: No   Sexual activity: Not on file  Other Topics Concern   Not on file  Social History Narrative   Not on file   Social Determinants of Health   Financial Resource Strain: Not on file  Food Insecurity: Not on file  Transportation Needs: Not on file  Physical Activity: Not on file  Stress: Not on file  Social Connections: Not on file  Intimate Partner Violence: Not on file    FAMILY HISTORY: Family History  Problem Relation Age of Onset   Hyperlipidemia Mother    Non-Hodgkin's lymphoma Father    Diabetes Maternal Grandmother    Colon cancer Neg Hx    Stomach cancer Neg Hx    Esophageal cancer Neg Hx    Colon polyps Neg Hx    Pancreatic cancer Neg Hx    Breast cancer Neg Hx     ALLERGIES:  has No Known Allergies.  MEDICATIONS:  Current Outpatient Medications  Medication Sig Dispense Refill   cyanocobalamin (VITAMIN B12) 1000 MCG/ML injection INJECT 1 ML UNDER THE SKIN ONCE WEEKLY FOR 4 WEEKS THEN INJECT 1 ML UNDER THE SKIN EVERY 30 DAYS 6 mL 1   diphenoxylate-atropine  (LOMOTIL) 2.5-0.025 MG tablet Take 1 tablet by mouth every 6 (six) hours as needed.     DUPIXENT 300 MG/2ML prefilled syringe Inject 300 mg into the skin every 14 (fourteen) days.     epoetin alfa-epbx (RETACRIT) 31540 UNIT/ML injection 10,000 Units as directed.     folic acid (FOLVITE) 1 MG tablet Take 1 tablet (1 mg total) by mouth daily. (Patient not taking: Reported on 05/25/2021) 30 tablet 1   furosemide (LASIX) 40 MG tablet Take 40 mg by mouth as needed.     hydrOXYzine (VISTARIL) 25 MG capsule Take 1-2 capsules (25-50 mg total) by mouth at bedtime as needed. (Patient not taking:  Reported on 05/25/2021) 30 capsule 1   meloxicam (MOBIC) 15 MG tablet Take 15 mg by mouth daily.     methocarbamol (ROBAXIN) 500 MG tablet Take 500 mg by mouth 2 (two) times daily.     omeprazole (PRILOSEC) 40 MG capsule Take 1 capsule (40 mg total) by mouth daily. 30 capsule 5   Pancrelipase, Lip-Prot-Amyl, (CREON) 24000-76000 units CPEP Take 2 capsules (48,000 Units total) by mouth with breakfast, with lunch, and with evening meal AND 1 capsule (24,000 Units total) with snacks. 180 capsule 3   thiamine 100 MG tablet Take 1 tablet (100 mg total) by mouth daily. (Patient not taking: Reported on 05/25/2021) 30 tablet 1   triamcinolone cream (KENALOG) 0.1 % Apply 1 application topically 4 (four) times daily as needed. (Patient not taking: Reported on 05/25/2021) 30 g 0   Vitamin D, Ergocalciferol, (DRISDOL) 1.25 MG (50000 UNIT) CAPS capsule TAKE ONE CAPSULE BY MOUTH WEEKLY 12 capsule 4   No current facility-administered medications for this visit.    REVIEW OF SYSTEMS:   10 Point review of Systems was done is negative except as noted above.  PHYSICAL EXAMINATION: Telemedicine appointment  LABORATORY DATA:  I have reviewed the data as listed  .    Latest Ref Rng & Units 09/25/2021    9:30 AM 08/14/2021   11:03 AM 07/28/2021    8:10 AM  CBC  WBC 4.0 - 10.5 K/uL 6.1  7.4  6.5   Hemoglobin 12.0 - 15.0 g/dL 10.7   10.5  8.6   Hematocrit 36.0 - 46.0 % 32.8  32.2  27.5   Platelets 150 - 400 K/uL 140  152  168    . CBC    Component Value Date/Time   WBC 6.1 09/25/2021 0930   WBC 6.5 07/28/2021 0810   RBC 3.21 (L) 09/25/2021 0930   RBC 3.18 (L) 09/25/2021 0930   HGB 10.7 (L) 09/25/2021 0930   HCT 32.8 (L) 09/25/2021 0930   HCT 26.5 (L) 02/13/2021 1258   PLT 140 (L) 09/25/2021 0930   MCV 103.1 (H) 09/25/2021 0930   MCH 33.6 09/25/2021 0930   MCHC 32.6 09/25/2021 0930   RDW 12.4 09/25/2021 0930   LYMPHSABS 1.6 09/25/2021 0930   MONOABS 0.6 09/25/2021 0930   EOSABS 0.1 09/25/2021 0930   BASOSABS 0.0 09/25/2021 0930    .    Latest Ref Rng & Units 09/25/2021    9:30 AM 08/14/2021   11:03 AM 07/10/2021    1:13 PM  CMP  Glucose 70 - 99 mg/dL 99  96  85   BUN 8 - 23 mg/dL _0 Creatinine 0.44 - 1.00 mg/dL 0.97  0.95  0.87   Sodium 135 - 145 mmol/L 135  134  140   Potassium 3.5 - 5.1 mmol/L 4.6  4.9  3.8   Chloride 98 - 111 mmol/L 108  110  116   CO2 22 - 32 mmol/L _1 Calcium 8.9 - 10.3 mg/dL 9.2  9.2  8.7   Total Protein 6.5 - 8.1 g/dL 7.7  7.7  6.3   Total Bilirubin 0.3 - 1.2 mg/dL 0.2  0.3  0.4   Alkaline Phos 38 - 126 U/L 167  106  59   AST 15 - 41 U/L 26  30  49   ALT 0 - 44 U/L _2 . 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  The plasma  cells display kappa light chain restriction consistent with plasma cell  neoplasm.  The background shows trilineage hematopoiesis with generally  nonspecific changes possibly related to history of liver disease,  alcohol consumption, etc.  Nonetheless, correlation with cytogenetic and  FISH studies is recommended."  RADIOGRAPHIC STUDIES: I have personally reviewed the radiological images as listed and agreed with the findings in the report. No results found.  ASSESSMENT & PLAN:   1) significant macrocytic anemia likely multifactorial but significant concern for liver cirrhosis and alcohol abuse.  Other factors Vitamin B12 deficiency and other potential nutritional deficiencyes caused by Gastric bypass surgery . Also had dyserythropoiesis on BM Bx.  Currently active significant alcohol abuse and liver disease are also significant contributing factors. 2) Iron deficiency- GI blood loss vs poor absorption related to Gastric bypass surgery. 09/17/2018 Occult blood card to lab is "POSITIVE" x2.  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  Recent admission with confusion and hepatic encephalopathy. 6) noncompliance with medical follow-up 7) Leg swelling -Down with diuresis. -[place] results not suggestive  of DVT  PLAN: Patient's labs done 09/26/2021 were discussed in detail with her CBC shows anemia with a drop in hemoglobin to 10.7 MCV 103.1 platelet count of 140k and WBC count of 6.1k Iron labs adequate B12 elevated myeloma panel from 08/14/2021 shows stable M spike of 1.3 g/dL Labs done 08/21/2021 were discussed. 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 Patient has declined PRBC transfusions completely due to her faith is Jehovah's Witness and would not want PRBC transfusions even under life-threatening situations. [place] abd done 06/13/2021 revealed minor gallbladder wall thickening with indeterminate etiology. [place] elastography done 07/10/2021 revealed findings suggestive of cirrhosis with trace ascites. Bone marrow biopsy done 07/28/2021 revealed cellular bone marrow with plasma cell neoplasm with plasma cells at 11%. Macrocytic anemia.  Continue vitamin B complex Myeloma panel shows stable M spike of 1.3 pending g/dL which is similar to about a year ago and does not show any overt signs of myeloma progression. She will continue to follow with her gastroenterologist for management of alcohol-related liver disease and her chronic diarrhea. -She reports that she has completely stopped drinking alcohol for the last 88 days and notes that she plans to avoid it long term. -We discussed  holding Reticrit and may continue to hold if labs remain stable.   FOLLOW UP: Phone visit with Dr Irene Limbo in 3 months Labs 1 week prior to phone visit  The total time spent in the appointment was 20 minutes*.  All of the patient's questions were answered with apparent satisfaction. The patient knows to call the clinic with any problems, questions or concerns.   Sullivan Lone MD MS AAHIVMS [facility] Hematology/Oncology Physician [facility]  .*Total Encounter Time as defined by the Centers for Medicare and Medicaid Services includes, in addition to the face-to-face time of a patient visit (documented in the note above)  non-face-to-face time: obtaining and reviewing outside history, ordering and reviewing medications, tests or procedures, care coordination (communications with other health care professionals or caregivers) and documentation in the medical record.  I, Melene Muller, am acting as scribe for Dr. Sullivan Lone, MD.  .I have reviewed the above documentation for accuracy and completeness, and I agree with the above. Brunetta Genera MD

## 2021-10-02 ENCOUNTER — Encounter: Payer: Self-pay | Admitting: Hematology

## 2021-10-11 ENCOUNTER — Other Ambulatory Visit: Payer: Self-pay | Admitting: Hematology

## 2021-10-16 ENCOUNTER — Encounter: Payer: Self-pay | Admitting: Hematology

## 2021-10-21 ENCOUNTER — Other Ambulatory Visit: Payer: Self-pay | Admitting: Hematology

## 2021-10-23 ENCOUNTER — Encounter: Payer: Self-pay | Admitting: Hematology

## 2021-10-23 ENCOUNTER — Ambulatory Visit: Payer: Commercial Managed Care - HMO | Admitting: Orthopedic Surgery

## 2021-10-26 ENCOUNTER — Other Ambulatory Visit: Payer: Self-pay | Admitting: Hematology

## 2021-10-26 MED ORDER — GABAPENTIN 100 MG PO CAPS: ORAL_CAPSULE | ORAL | 2 refills | Status: DC

## 2021-11-20 ENCOUNTER — Ambulatory Visit: Payer: Self-pay

## 2021-11-20 ENCOUNTER — Ambulatory Visit (INDEPENDENT_AMBULATORY_CARE_PROVIDER_SITE_OTHER): Payer: Commercial Managed Care - HMO

## 2021-11-20 ENCOUNTER — Ambulatory Visit (INDEPENDENT_AMBULATORY_CARE_PROVIDER_SITE_OTHER): Payer: Commercial Managed Care - HMO | Admitting: Orthopedic Surgery

## 2021-11-20 DIAGNOSIS — M25511 Pain in right shoulder: Secondary | ICD-10-CM

## 2021-11-20 DIAGNOSIS — G8929 Other chronic pain: Secondary | ICD-10-CM

## 2021-11-20 DIAGNOSIS — M25512 Pain in left shoulder: Secondary | ICD-10-CM

## 2021-12-04 ENCOUNTER — Encounter: Payer: Self-pay | Admitting: Orthopedic Surgery

## 2021-12-04 DIAGNOSIS — G8929 Other chronic pain: Secondary | ICD-10-CM | POA: Diagnosis not present

## 2021-12-04 DIAGNOSIS — M25511 Pain in right shoulder: Secondary | ICD-10-CM

## 2021-12-04 MED ORDER — METHYLPREDNISOLONE ACETATE 40 MG/ML IJ SUSP
40.0000 mg | INTRAMUSCULAR | Status: AC | PRN
  Administered 2021-12-04: 40 mg via INTRA_ARTICULAR

## 2021-12-04 MED ORDER — LIDOCAINE HCL 1 % IJ SOLN
5.0000 mL | INTRAMUSCULAR | Status: AC | PRN
  Administered 2021-12-04: 5 mL

## 2021-12-04 NOTE — Progress Notes (Signed)
Office Visit Note   Patient: Debra Jacobson           Date of Birth: 1960/12/13           MRN: [id number] Visit Date: 11/20/2021              Requested by: Mckinley Jewel, MD [address] [place],  [place] [address] PCP: Mckinley Jewel, MD  Chief Complaint  Patient presents with   Right Shoulder - Pain   Left Shoulder - Pain      HPI: Patient is a 61 year old woman who has had chronic bilateral shoulder pain.  Patient has progressive pain in both shoulders right worse than left and is also been having knee pain.  Assessment & Plan: Visit Diagnoses:  1. Chronic left shoulder pain   2. Chronic right shoulder pain     Plan: Right shoulder was injected she tolerated this well follow-up as needed  Follow-Up Instructions: Return if symptoms worsen or fail to improve.   Ortho Exam  Patient is alert, oriented, no adenopathy, well-dressed, normal affect, normal respiratory effort. Examination patient has abduction and flexion of both shoulders to 120 degrees.  There is crepitation with range of motion of her shoulders pain with Neer and Hawkins impingement test bilaterally.  Imaging: No results found. No images are attached to the encounter.  Labs: Lab Results  Component Value Date   HGBA1C 5.5 10/26/2011   HGBA1C 5.4 09/10/2011   ESRSEDRATE 14 03/28/2021   ESRSEDRATE 37 (H) 09/01/2018   LABURIC 9.3 (H) 03/28/2021     Lab Results  Component Value Date   ALBUMIN 3.7 09/25/2021   ALBUMIN 3.8 08/14/2021   ALBUMIN 2.9 (L) 07/10/2021   PREALBUMIN 9.1 (L) 10/26/2011    Lab Results  Component Value Date   MG 2.1 01/31/2021   MG 1.7 05/20/2019   Lab Results  Component Value Date   VD25OH 27 (L) 03/28/2021   VD25OH 45 03/01/2011    Lab Results  Component Value Date   PREALBUMIN 9.1 (L) 10/26/2011      Latest Ref Rng & Units 09/25/2021    9:30 AM 08/14/2021   11:03 AM 07/28/2021    8:10 AM  CBC EXTENDED  WBC 4.0 - 10.5 K/uL 6.1  7.4  6.5    RBC 3.87 - 5.11 MIL/uL 3.87 - 5.11 MIL/uL 3.21    3.18  2.94  2.37   Hemoglobin 12.0 - 15.0 g/dL 10.7  10.5  8.6   HCT 36.0 - 46.0 % 32.8  32.2  27.5   Platelets 150 - 400 K/uL 140  152  168   NEUT# 1.7 - 7.7 K/uL 3.9  5.2  4.1   Lymph# 0.7 - 4.0 K/uL 1.6  1.4  1.4      There is no height or weight on file to calculate BMI.  Orders:  Orders Placed This Encounter  Procedures   XR Shoulder Left   XR Shoulder Right   No orders of the defined types were placed in this encounter.    Procedures: Large Joint Inj: R subacromial bursa on 12/04/2021 7:56 AM Indications: diagnostic evaluation and pain Details: 22 G 1.5 in needle  Arthrogram: No  Medications: 5 mL lidocaine 1 %; 40 mg methylPREDNISolone acetate 40 MG/ML Outcome: tolerated well, no immediate complications Procedure, treatment alternatives, risks and benefits explained, specific risks discussed. Consent was given by the patient. Immediately prior to procedure a time out was called to verify the  correct patient, procedure, equipment, support staff and site/side marked as required. Patient was prepped and draped in the usual sterile fashion.      Clinical Data: No additional findings.  ROS:  All other systems negative, except as noted in the HPI. Review of Systems  Objective: Vital Signs: LMP 04/14/2012   Specialty Comments:  No specialty comments available.  PMFS History: Patient Active Problem List   Diagnosis Date Noted   Acute alcoholic pancreatitis 03/54/6568   Alcohol dependence ([place]) 05/20/2019   Iron deficiency anemia 09/10/2018   Eczema 12/09/2011   Status post gastric bypass for obesity 05/30/2011   Past Medical History:  Diagnosis Date   Allergy    all rhinitis   Dependent edema    Dermatitis    GERD (gastroesophageal reflux disease)    Hypertension     Family History  Problem Relation Age of Onset   Hyperlipidemia Mother    Non-Hodgkin's lymphoma Father    Diabetes Maternal  Grandmother    Colon cancer Neg Hx    Stomach cancer Neg Hx    Esophageal cancer Neg Hx    Colon polyps Neg Hx    Pancreatic cancer Neg Hx    Breast cancer Neg Hx     Past Surgical History:  Procedure Laterality Date   CESAREAN SECTION     x2   TUBAL LIGATION     Social History   Occupational History   Occupation: Midwife  Tobacco Use   Smoking status: Former    Types: Cigarettes    Quit date: 08/29/1991    Years since quitting: 30.2   Smokeless tobacco: Never  Vaping Use   Vaping Use: Never used  Substance and Sexual Activity   Alcohol use: Not Currently    Comment: Patient drinks 2 40oz beers multiple days a week   Drug use: No   Sexual activity: Not on file

## 2022-01-25 ENCOUNTER — Encounter: Payer: Self-pay | Admitting: Hematology

## 2022-01-30 ENCOUNTER — Encounter: Payer: Self-pay | Admitting: Physician Assistant

## 2022-01-30 ENCOUNTER — Ambulatory Visit: Payer: Commercial Managed Care - HMO | Admitting: Physician Assistant

## 2022-01-30 DIAGNOSIS — M25562 Pain in left knee: Secondary | ICD-10-CM | POA: Diagnosis not present

## 2022-01-30 DIAGNOSIS — G8929 Other chronic pain: Secondary | ICD-10-CM

## 2022-01-30 MED ORDER — LIDOCAINE HCL 1 % IJ SOLN
2.0000 mL | INTRAMUSCULAR | Status: AC | PRN
  Administered 2022-01-30: 2 mL

## 2022-01-30 MED ORDER — METHYLPREDNISOLONE ACETATE 40 MG/ML IJ SUSP
80.0000 mg | INTRAMUSCULAR | Status: AC | PRN
  Administered 2022-01-30: 80 mg via INTRA_ARTICULAR

## 2022-01-30 MED ORDER — BUPIVACAINE HCL 0.25 % IJ SOLN
2.0000 mL | INTRAMUSCULAR | Status: AC | PRN
  Administered 2022-01-30: 2 mL via INTRA_ARTICULAR

## 2022-01-30 NOTE — Progress Notes (Signed)
Office Visit Note   Patient: Debra Jacobson           Date of Birth: 04/12/1960           MRN: [id number] Visit Date: 01/30/2022              Requested by: Debra Jacobson [address] [place],   [address] PCP: Debra Jacobson  Chief Complaint  Patient presents with  . Left Knee - Pain      HPI: 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  She is a patient of Debra Jacobson.  She periodically gets injections into her left knee and shoulders.  She is scheduled to see Debra Jacobson on February 8 regarding her shoulders.  She comes in today wondering if the knee could be injected as it is becoming more more painful.  Denies any injuries.  Last injection was over 6 months ago  Assessment & Plan: Visit Diagnoses:  1. Chronic pain of left knee     Plan: Left knee injected without difficulty can follow-up with her shoulders with Debra Jacobson as planned  Follow-Up Instructions: Return if symptoms worsen or fail to improve.   Ortho Exam  Patient is alert, oriented, no adenopathy, well-dressed, normal affect, normal respiratory effort. Examination of her left knee no effusion no erythema no redness.  She does have more tenderness laterally than medially.  Lateral medial stabilizers are intact compartments are soft and nontender negative Homans' sign   Imaging: No results found. No images are attached to the encounter.  Labs: Lab Results  Component Value Date   HGBA1C 5.5 10/26/2011   HGBA1C 5.4 09/10/2011   ESRSEDRATE 14 03/28/2021   ESRSEDRATE 37 (H) 09/01/2018   LABURIC 9.3 (H) 03/28/2021     Lab Results  Component Value Date   ALBUMIN 3.7 09/25/2021   ALBUMIN 3.8 08/14/2021   ALBUMIN 2.9 (L) 07/10/2021   PREALBUMIN 9.1 (L) 10/26/2011    Lab Results  Component Value Date   MG 2.1 01/31/2021   MG 1.7 05/20/2019   Lab Results  Component Value Date   VD25OH 27 (L) 03/28/2021   VD25OH 45 03/01/2011    Lab Results   Component Value Date   PREALBUMIN 9.1 (L) 10/26/2011      Latest Ref Rng & Units 09/25/2021    9:30 AM 08/14/2021   11:03 AM 07/28/2021    8:10 AM  CBC EXTENDED  WBC 4.0 - 10.5 K/uL 6.1  7.4  6.5   RBC 3.87 - 5.11 MIL/uL 3.87 - 5.11 MIL/uL 3.21    3.18  2.94  2.37   Hemoglobin 12.0 - 15.0 g/dL 10.7  10.5  8.6   HCT 36.0 - 46.0 % 32.8  32.2  27.5   Platelets 150 - 400 K/uL 140  152  168   NEUT# 1.7 - 7.7 K/uL 3.9  5.2  4.1   Lymph# 0.7 - 4.0 K/uL 1.6  1.4  1.4      There is no height or weight on file to calculate BMI.  Orders:  No orders of the defined types were placed in this encounter.  No orders of the defined types were placed in this encounter.    Procedures: Large Joint Inj on 01/30/2022 4:21 PM Indications: diagnostic evaluation and pain Details: 25 G 1.5 in needle, anterolateral approach  Arthrogram: No  Medications: 2 mL lidocaine 1 %; 80 mg methylPREDNISolone acetate 40  MG/ML; 2 mL bupivacaine 0.25 % Outcome: tolerated well, no immediate complications Procedure, treatment alternatives, risks and benefits explained, specific risks discussed. Consent was Jacobson by the patient.    Clinical Data: No additional findings.  ROS:  All other systems negative, except as noted in the HPI. Review of Systems  Objective: Vital Signs: LMP 04/14/2012   Specialty Comments:  No specialty comments available.  PMFS History: Patient Active Problem List   Diagnosis Date Noted  . Pain in left knee 01/30/2022  . Acute alcoholic pancreatitis 16/10/9602  . Alcohol dependence ([place]) 05/20/2019  . Iron deficiency anemia 09/10/2018  . Eczema 12/09/2011  . Status post gastric bypass for obesity 05/30/2011   Past Medical History:  Diagnosis Date  . Allergy    all rhinitis  . Dependent edema   . Dermatitis   . GERD (gastroesophageal reflux disease)   . Hypertension     Family History  Problem Relation Age of Onset  . Hyperlipidemia Mother   . Non-Hodgkin's lymphoma  Father   . Diabetes Maternal Grandmother   . Colon cancer Neg Hx   . Stomach cancer Neg Hx   . Esophageal cancer Neg Hx   . Colon polyps Neg Hx   . Pancreatic cancer Neg Hx   . Breast cancer Neg Hx     Past Surgical History:  Procedure Laterality Date  . CESAREAN SECTION     x2  . TUBAL LIGATION     Social History   Occupational History  . Occupation: Midwife  Tobacco Use  . Smoking status: Former    Types: Cigarettes    Quit date: 08/29/1991    Years since quitting: 30.4  . Smokeless tobacco: Never  Vaping Use  . Vaping Use: Never used  Substance and Sexual Activity  . Alcohol use: Not Currently    Comment: Patient drinks 2 40oz beers multiple days a week  . Drug use: No  . Sexual activity: Not on file

## 2022-02-07 ENCOUNTER — Other Ambulatory Visit: Payer: Self-pay | Admitting: Internal Medicine

## 2022-02-07 DIAGNOSIS — K2970 Gastritis, unspecified, without bleeding: Secondary | ICD-10-CM

## 2022-02-08 ENCOUNTER — Encounter: Payer: Self-pay | Admitting: Hematology

## 2022-02-08 ENCOUNTER — Ambulatory Visit: Payer: Commercial Managed Care - HMO | Admitting: Orthopedic Surgery

## 2022-02-09 ENCOUNTER — Encounter: Payer: Self-pay | Admitting: Podiatry

## 2022-02-09 ENCOUNTER — Ambulatory Visit (INDEPENDENT_AMBULATORY_CARE_PROVIDER_SITE_OTHER): Payer: Commercial Managed Care - HMO | Admitting: Podiatry

## 2022-02-09 ENCOUNTER — Ambulatory Visit (INDEPENDENT_AMBULATORY_CARE_PROVIDER_SITE_OTHER): Payer: Commercial Managed Care - HMO

## 2022-02-09 DIAGNOSIS — M899 Disorder of bone, unspecified: Secondary | ICD-10-CM

## 2022-02-09 DIAGNOSIS — G8929 Other chronic pain: Secondary | ICD-10-CM | POA: Insufficient documentation

## 2022-02-09 NOTE — Progress Notes (Signed)
Subjective:   Patient ID: Debra Jacobson, female   DOB: 62 y.o.   MRN: [id number]   HPI Patient presents stating that she damaged her left big toenail and it turned black and she is getting pain on top of the toe that is been present for around 4 months.  Patient does not smoke likes to be active   Review of Systems  All other systems reviewed and are negative.       Objective:  Physical Exam Vitals and nursing note reviewed.  Constitutional:      Appearance: She is well-developed.  Pulmonary:     Effort: Pulmonary effort is normal.  Musculoskeletal:        General: Normal range of motion.  Skin:    General: Skin is warm.  Neurological:     Mental Status: She is alert.     Neurovascular status intact muscle strength adequate range of motion within normal limits with inflammation of the distal portion of the left hallux with moderate lifting of the nailbed as a secondary issue.  Good digital perfusion well-oriented     Assessment:  Traumatized left digit with also nail disease with what appears to be the possibility for 2 problems     Plan:  H&P x-ray reviewed discussed condition at great length.  I do not recommend nail work currently but ultimately it may need to be removed and I do not recommend anything for this distal area and hoping it will improve on its own but she does have a long toe.  I then went ahead and I did apply cushioning to the toe and advised on soaks  X-rays were negative for bone spur formation appears to be soft tissue

## 2022-02-12 ENCOUNTER — Encounter: Payer: Self-pay | Admitting: Hematology

## 2022-02-19 ENCOUNTER — Ambulatory Visit (INDEPENDENT_AMBULATORY_CARE_PROVIDER_SITE_OTHER): Payer: Commercial Managed Care - HMO | Admitting: Orthopedic Surgery

## 2022-02-19 DIAGNOSIS — G8929 Other chronic pain: Secondary | ICD-10-CM

## 2022-02-19 DIAGNOSIS — M25562 Pain in left knee: Secondary | ICD-10-CM | POA: Diagnosis not present

## 2022-02-19 DIAGNOSIS — M25511 Pain in right shoulder: Secondary | ICD-10-CM | POA: Diagnosis not present

## 2022-02-26 ENCOUNTER — Encounter: Payer: Self-pay | Admitting: Orthopedic Surgery

## 2022-02-26 DIAGNOSIS — G8929 Other chronic pain: Secondary | ICD-10-CM | POA: Diagnosis not present

## 2022-02-26 DIAGNOSIS — M25511 Pain in right shoulder: Secondary | ICD-10-CM

## 2022-02-26 DIAGNOSIS — M25562 Pain in left knee: Secondary | ICD-10-CM

## 2022-02-26 MED ORDER — LIDOCAINE HCL 1 % IJ SOLN
5.0000 mL | INTRAMUSCULAR | Status: AC | PRN
  Administered 2022-02-26: 5 mL

## 2022-02-26 MED ORDER — LIDOCAINE HCL (PF) 1 % IJ SOLN
5.0000 mL | INTRAMUSCULAR | Status: AC | PRN
  Administered 2022-02-26: 5 mL

## 2022-02-26 MED ORDER — METHYLPREDNISOLONE ACETATE 40 MG/ML IJ SUSP
40.0000 mg | INTRAMUSCULAR | Status: AC | PRN
  Administered 2022-02-26: 40 mg via INTRA_ARTICULAR

## 2022-02-26 NOTE — Progress Notes (Signed)
Office Visit Note   Patient: Debra Jacobson           Date of Birth: [birth year]           MRN: [id number] Visit Date: 02/19/2022              Requested by: Mckinley Jewel, MD [address] [place],  [place] [address] PCP: Mckinley Jewel, MD  Chief Complaint  Patient presents with   Left Knee - Pain   Right Shoulder - Pain      HPI: Patient is a 62 year old woman who presents in follow-up for chronic left knee and right shoulder pain.  She is status post left knee steroid injection on January 30.  Assessment & Plan: Visit Diagnoses:  1. Chronic pain of left knee   2. Chronic right shoulder pain     Plan: Right shoulder and left knee were injected patient tolerated this well she will follow-up as needed.  Follow-Up Instructions: Return if symptoms worsen or fail to improve.   Ortho Exam  Patient is alert, oriented, no adenopathy, well-dressed, normal affect, normal respiratory effort. Examination patient has abduction and flexion to 90 degrees of the right shoulder.  She has pain with Neer and Hawkins impingement test pain to palpation of the biceps tendon.  Examination left knee there is no effusion collaterals and cruciates are stable she has crepitation with range of motion tender to palpation of the medial lateral joint line.  Imaging: No results found. No images are attached to the encounter.  Labs: Lab Results  Component Value Date   HGBA1C 5.5 10/26/2011   HGBA1C 5.4 09/10/2011   ESRSEDRATE 14 03/28/2021   ESRSEDRATE 37 (H) 09/01/2018   LABURIC 9.3 (H) 03/28/2021     Lab Results  Component Value Date   ALBUMIN 3.7 09/25/2021   ALBUMIN 3.8 08/14/2021   ALBUMIN 2.9 (L) 07/10/2021   PREALBUMIN 9.1 (L) 10/26/2011    Lab Results  Component Value Date   MG 2.1 01/31/2021   MG 1.7 05/20/2019   Lab Results  Component Value Date   VD25OH 27 (L) 03/28/2021   VD25OH 45 03/01/2011    Lab Results  Component Value Date   PREALBUMIN 9.1  (L) 10/26/2011      Latest Ref Rng & Units 09/25/2021    9:30 AM 08/14/2021   11:03 AM 07/28/2021    8:10 AM  CBC EXTENDED  WBC 4.0 - 10.5 K/uL 6.1  7.4  6.5   RBC 3.87 - 5.11 MIL/uL 3.87 - 5.11 MIL/uL 3.21    3.18  2.94  2.37   Hemoglobin 12.0 - 15.0 g/dL 10.7  10.5  8.6   HCT 36.0 - 46.0 % 32.8  32.2  27.5   Platelets 150 - 400 K/uL 140  152  168   NEUT# 1.7 - 7.7 K/uL 3.9  5.2  4.1   Lymph# 0.7 - 4.0 K/uL 1.6  1.4  1.4      There is no height or weight on file to calculate BMI.  Orders:  No orders of the defined types were placed in this encounter.  No orders of the defined types were placed in this encounter.    Procedures: Large Joint Inj: L knee on 02/26/2022 7:59 AM Indications: pain and diagnostic evaluation Details: 22 G 1.5 in needle, anteromedial approach  Arthrogram: No  Medications: 5 mL lidocaine (PF) 1 %; 40 mg methylPREDNISolone acetate 40 MG/ML Outcome: tolerated well, no immediate complications  Procedure, treatment alternatives, risks and benefits explained, specific risks discussed. Consent was given by the patient. Immediately prior to procedure a time out was called to verify the correct patient, procedure, equipment, support staff and site/side marked as required. Patient was prepped and draped in the usual sterile fashion.    Large Joint Inj: R subacromial bursa on 02/26/2022 7:59 AM Indications: diagnostic evaluation and pain Details: 22 G 1.5 in needle, posterior approach  Arthrogram: No  Medications: 5 mL lidocaine 1 %; 40 mg methylPREDNISolone acetate 40 MG/ML Outcome: tolerated well, no immediate complications Procedure, treatment alternatives, risks and benefits explained, specific risks discussed. Consent was given by the patient. Immediately prior to procedure a time out was called to verify the correct patient, procedure, equipment, support staff and site/side marked as required. Patient was prepped and draped in the usual sterile fashion.       Clinical Data: No additional findings.  ROS:  All other systems negative, except as noted in the HPI. Review of Systems  Objective: Vital Signs: LMP 04/14/2012   Specialty Comments:  No specialty comments available.  PMFS History: Patient Active Problem List   Diagnosis Date Noted   Chronic pain 02/09/2022   Pain in left knee [id number]   Acute alcoholic pancreatitis 123XX123   Alcohol dependence ([place]) 05/20/2019   Iron deficiency anemia 09/10/2018   Abnormal cervical Papanicolaou smear 08/15/2018   Anemia 08/11/2018   Chronic hepatitis C ([place]) 07/27/2013   Elevated liver function tests 07/27/2013   Overweight (BMI 25.0-29.9) 07/27/2013   Eczema 12/09/2011   Status post gastric bypass for obesity 05/30/2011   Status post bariatric surgery 05/30/2011   Past Medical History:  Diagnosis Date   Allergy    all rhinitis   Dependent edema    Dermatitis    GERD (gastroesophageal reflux disease)    Hypertension     Family History  Problem Relation Age of Onset   Hyperlipidemia Mother    Non-Hodgkin's lymphoma Father    Diabetes Maternal Grandmother    Colon cancer Neg Hx    Stomach cancer Neg Hx    Esophageal cancer Neg Hx    Colon polyps Neg Hx    Pancreatic cancer Neg Hx    Breast cancer Neg Hx     Past Surgical History:  Procedure Laterality Date   CESAREAN SECTION     x2   TUBAL LIGATION     Social History   Occupational History   Occupation: Midwife  Tobacco Use   Smoking status: Former    Types: Cigarettes    Quit date: 08/29/1991    Years since quitting: 30.5   Smokeless tobacco: Never  Vaping Use   Vaping Use: Never used  Substance and Sexual Activity   Alcohol use: Not Currently    Comment: Patient drinks 2 40oz beers multiple days a week   Drug use: No   Sexual activity: Not on file

## 2022-03-05 ENCOUNTER — Encounter: Payer: Self-pay | Admitting: Podiatry

## 2022-03-05 ENCOUNTER — Ambulatory Visit (INDEPENDENT_AMBULATORY_CARE_PROVIDER_SITE_OTHER): Payer: Commercial Managed Care - HMO | Admitting: Podiatry

## 2022-03-05 DIAGNOSIS — M899 Disorder of bone, unspecified: Secondary | ICD-10-CM | POA: Diagnosis not present

## 2022-03-05 DIAGNOSIS — B351 Tinea unguium: Secondary | ICD-10-CM

## 2022-03-07 ENCOUNTER — Encounter: Payer: Self-pay | Admitting: Hematology

## 2022-03-07 NOTE — Progress Notes (Signed)
Subjective:   Patient ID: Debra Jacobson, female   DOB: 62 y.o.   MRN: [id number]   HPI Patient presents stating that her left big toenail has detached and she is concerned because it is still bothering her   ROS      Objective:  Physical Exam  Neurovascular status intact left hallux nail has lifted and it now is becoming inflamed on the distal portion with no drainage erythema noted     Assessment:  Damage left hallux nail affecting the distal one third of the nailbed     Plan:  Reviewed and at this point using sterile instrumentation distal debridement accomplished and cleaned out and advised on soaks.  If this were to persist eventually we may need to remove the entire nail which I made her aware of today

## 2022-03-21 ENCOUNTER — Encounter: Payer: Self-pay | Admitting: Family

## 2022-03-21 ENCOUNTER — Ambulatory Visit (INDEPENDENT_AMBULATORY_CARE_PROVIDER_SITE_OTHER): Payer: Commercial Managed Care - HMO | Admitting: Family

## 2022-03-21 DIAGNOSIS — G8929 Other chronic pain: Secondary | ICD-10-CM | POA: Diagnosis not present

## 2022-03-21 DIAGNOSIS — M7541 Impingement syndrome of right shoulder: Secondary | ICD-10-CM | POA: Diagnosis not present

## 2022-03-21 DIAGNOSIS — M25511 Pain in right shoulder: Secondary | ICD-10-CM

## 2022-03-21 MED ORDER — METHYLPREDNISOLONE ACETATE 40 MG/ML IJ SUSP
40.0000 mg | INTRAMUSCULAR | Status: AC | PRN
  Administered 2022-03-21: 40 mg via INTRA_ARTICULAR

## 2022-03-21 MED ORDER — LIDOCAINE HCL 1 % IJ SOLN
5.0000 mL | INTRAMUSCULAR | Status: AC | PRN
  Administered 2022-03-21: 5 mL

## 2022-03-21 NOTE — Progress Notes (Signed)
Office Visit Note   Patient: Debra Jacobson           Date of Birth: January 29, 1960           MRN: [id number] Visit Date: 03/21/2022              Requested by: Mckinley Jewel, MD [address] [place],  [place] [address] PCP: Mckinley Jewel, MD  Chief Complaint  Patient presents with   Right Shoulder - Pain    S/p cortisone injection 11/2021      HPI: The patient is a 62 year old woman seen today in follow-up for ongoing issues with her right shoulder.  She has been having loss of range of motion unable to reach above shoulder height difficulty with activities of daily living and her work duties due to loss of range of motion pain to the right shoulder she has deep pain to the bone she denies any radiating symptoms today no numbness no tingling no neck pain  Concerned that her last injection did not provide adequate relief  Assessment & Plan: Visit Diagnoses: No diagnosis found.  Plan: Will repeat the right shoulder injection today discussed that this will not be able to be repeated again for about 3 months.  She states she is not in a place to undergo any surgical intervention at this time  Follow-Up Instructions: No follow-ups on file.   Ortho Exam  Patient is alert, oriented, no adenopathy, well-dressed, normal affect, normal respiratory effort. On examination of the right shoulder she does have abduction and flexion to 90 degrees of the right shoulder she has pain with Neer and Hawkins impingement testing.  Pain to palpation of the biceps tendon.  Imaging: No results found. No images are attached to the encounter.  Labs: Lab Results  Component Value Date   HGBA1C 5.5 10/26/2011   HGBA1C 5.4 09/10/2011   ESRSEDRATE 14 03/28/2021   ESRSEDRATE 37 (H) 09/01/2018   LABURIC 9.3 (H) 03/28/2021     Lab Results  Component Value Date   ALBUMIN 3.7 09/25/2021   ALBUMIN 3.8 08/14/2021   ALBUMIN 2.9 (L) 07/10/2021   PREALBUMIN 9.1 (L) 10/26/2011    Lab  Results  Component Value Date   MG 2.1 01/31/2021   MG 1.7 05/20/2019   Lab Results  Component Value Date   VD25OH 27 (L) 03/28/2021   VD25OH 45 03/01/2011    Lab Results  Component Value Date   PREALBUMIN 9.1 (L) 10/26/2011      Latest Ref Rng & Units 09/25/2021    9:30 AM 08/14/2021   11:03 AM 07/28/2021    8:10 AM  CBC EXTENDED  WBC 4.0 - 10.5 K/uL 6.1  7.4  6.5   RBC 3.87 - 5.11 MIL/uL 3.87 - 5.11 MIL/uL 3.21    3.18  2.94  2.37   Hemoglobin 12.0 - 15.0 g/dL 10.7  10.5  8.6   HCT 36.0 - 46.0 % 32.8  32.2  27.5   Platelets 150 - 400 K/uL 140  152  168   NEUT# 1.7 - 7.7 K/uL 3.9  5.2  4.1   Lymph# 0.7 - 4.0 K/uL 1.6  1.4  1.4      There is no height or weight on file to calculate BMI.  Orders:  No orders of the defined types were placed in this encounter.  No orders of the defined types were placed in this encounter.    Procedures: Large Joint Inj: R subacromial  bursa on 03/21/2022 9:32 AM Indications: pain Details: 22 G 1.5 in needle Medications: 5 mL lidocaine 1 %; 40 mg methylPREDNISolone acetate 40 MG/ML Consent was given by the patient.      Clinical Data: No additional findings.  ROS:  All other systems negative, except as noted in the HPI. Review of Systems  Objective: Vital Signs: LMP 04/14/2012   Specialty Comments:  No specialty comments available.  PMFS History: Patient Active Problem List   Diagnosis Date Noted   Chronic pain 02/09/2022   Pain in left knee [id number]   Acute alcoholic pancreatitis 123XX123   Alcohol dependence ([place]) 05/20/2019   Iron deficiency anemia 09/10/2018   Abnormal cervical Papanicolaou smear 08/15/2018   Anemia 08/11/2018   Chronic hepatitis C ([place]) 07/27/2013   Elevated liver function tests 07/27/2013   Overweight (BMI 25.0-29.9) 07/27/2013   Eczema 12/09/2011   Status post gastric bypass for obesity 05/30/2011   Status post bariatric surgery 05/30/2011   Past Medical History:  Diagnosis Date    Allergy    all rhinitis   Dependent edema    Dermatitis    GERD (gastroesophageal reflux disease)    Hypertension     Family History  Problem Relation Age of Onset   Hyperlipidemia Mother    Non-Hodgkin's lymphoma Father    Diabetes Maternal Grandmother    Colon cancer Neg Hx    Stomach cancer Neg Hx    Esophageal cancer Neg Hx    Colon polyps Neg Hx    Pancreatic cancer Neg Hx    Breast cancer Neg Hx     Past Surgical History:  Procedure Laterality Date   CESAREAN SECTION     x2   TUBAL LIGATION     Social History   Occupational History   Occupation: Midwife  Tobacco Use   Smoking status: Former    Types: Cigarettes    Quit date: 08/29/1991    Years since quitting: 30.5   Smokeless tobacco: Never  Vaping Use   Vaping Use: Never used  Substance and Sexual Activity   Alcohol use: Not Currently    Comment: Patient drinks 2 40oz beers multiple days a week   Drug use: No   Sexual activity: Not on file

## 2022-04-09 ENCOUNTER — Other Ambulatory Visit: Payer: Self-pay | Admitting: Hematology

## 2022-04-10 ENCOUNTER — Encounter: Payer: Self-pay | Admitting: Hematology

## 2022-04-11 ENCOUNTER — Other Ambulatory Visit: Payer: Self-pay | Admitting: Hematology

## 2022-04-12 ENCOUNTER — Encounter: Payer: Self-pay | Admitting: Hematology

## 2022-05-05 ENCOUNTER — Encounter: Payer: Self-pay | Admitting: Hematology

## 2022-05-22 ENCOUNTER — Ambulatory Visit (INDEPENDENT_AMBULATORY_CARE_PROVIDER_SITE_OTHER): Payer: Commercial Managed Care - HMO | Admitting: Orthopedic Surgery

## 2022-05-22 DIAGNOSIS — M25512 Pain in left shoulder: Secondary | ICD-10-CM | POA: Diagnosis not present

## 2022-05-22 DIAGNOSIS — G8929 Other chronic pain: Secondary | ICD-10-CM | POA: Diagnosis not present

## 2022-05-22 DIAGNOSIS — M25511 Pain in right shoulder: Secondary | ICD-10-CM | POA: Diagnosis not present

## 2022-05-23 ENCOUNTER — Encounter: Payer: Self-pay | Admitting: Orthopedic Surgery

## 2022-05-23 DIAGNOSIS — M25511 Pain in right shoulder: Secondary | ICD-10-CM

## 2022-05-23 DIAGNOSIS — M25512 Pain in left shoulder: Secondary | ICD-10-CM | POA: Diagnosis not present

## 2022-05-23 NOTE — Progress Notes (Signed)
Office Visit Note   Patient: Debra Jacobson           Date of Birth: 1960-08-25           MRN: [id number] Visit Date: 05/22/2022              Requested by: Ollen Bowl, MD [address] [place],  [place] [address] PCP: Ollen Bowl, MD  Chief Complaint  Patient presents with   Right Shoulder - Pain   Left Shoulder - Pain      HPI: Patient is a 62 year old woman presents with bilateral shoulder pain worse on the right than the left.  Patient states she has difficulty sleeping at night.  She is status post a right shoulder injection in March.  Patient states she also has episodes of mechanical symptoms in the left knee.  Patient is currently on Neurontin and Robaxin and Mobic.  Assessment & Plan: Visit Diagnoses:  1. Chronic right shoulder pain   2. Chronic left shoulder pain     Plan: Continue with conservative therapy.  Patient may require if found   Patient may require evaluation for shoulder surgery.  Follow-Up Instructions: No follow-ups on file.   Ortho Exam  Patient is alert, oriented, no adenopathy, well-dressed, normal affect, normal respiratory effort. Examination patient has good range of motion she has crepitation with range of motion of both shoulders she has pain with Neer and Hawkins impingement test bilaterally.  Pain to palpation of the biceps tendon.  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  Imaging: No results found. No images are attached to the encounter.  Labs: Lab Results  Component Value Date   HGBA1C 5.5 10/26/2011   HGBA1C 5.4 09/10/2011   ESRSEDRATE 14 03/28/2021   ESRSEDRATE 37 (H) 09/01/2018   LABURIC 9.3 (H) 03/28/2021     Lab Results  Component Value Date   ALBUMIN 3.7 09/25/2021   ALBUMIN 3.8 08/14/2021   ALBUMIN 2.9 (L) 07/10/2021   PREALBUMIN 9.1 (L) 10/26/2011    Lab Results  Component Value Date   MG 2.1 01/31/2021   MG 1.7 05/20/2019   Lab Results  Component Value Date    VD25OH 27 (L) 03/28/2021   VD25OH 45 03/01/2011    Lab Results  Component Value Date   PREALBUMIN 9.1 (L) 10/26/2011      Latest Ref Rng & Units 09/25/2021    9:30 AM 08/14/2021   11:03 AM 07/28/2021    8:10 AM  CBC EXTENDED  WBC 4.0 - 10.5 K/uL 6.1  7.4  6.5   RBC 3.87 - 5.11 MIL/uL 3.87 - 5.11 MIL/uL 3.21    3.18  2.94  2.37   Hemoglobin 12.0 - 15.0 g/dL 19.1  47.8  8.6   HCT 29.5 - 46.0 % 32.8  32.2  27.5   Platelets 150 - 400 K/uL 140  152  168   NEUT# 1.7 - 7.7 K/uL 3.9  5.2  4.1   Lymph# 0.7 - 4.0 K/uL 1.6  1.4  1.4      There is no height or weight on file to calculate BMI.  Orders:  No orders of the defined types were placed in this encounter.  No orders of the defined types were placed in this encounter.    Procedures: Large Joint Inj: bilateral subacromial bursa on 05/23/2022 3:08 PM Indications: diagnostic evaluation and pain Details: 22 G 1.5 in needle, posterior approach  Arthrogram: No  Outcome: tolerated well, no  immediate complications Procedure, treatment alternatives, risks and benefits explained, specific risks discussed. Consent was given by the patient. Immediately prior to procedure a time out was called to verify the correct patient, procedure, equipment, support staff and site/side marked as required. Patient was prepped and draped in the usual sterile fashion.      Clinical Data: No additional findings.  ROS:  All other systems negative, except as noted in the HPI. Review of Systems  Objective: Vital Signs: LMP 04/14/2012   Specialty Comments:  No specialty comments available.  PMFS History: Patient Active Problem List   Diagnosis Date Noted   Chronic pain 02/09/2022   Pain in left knee 01/30/2022   Acute alcoholic pancreatitis 05/20/2019   Alcohol dependence (HCC) 05/20/2019   Iron deficiency anemia 09/10/2018   Abnormal cervical Papanicolaou smear 08/15/2018   Anemia 08/11/2018   Chronic hepatitis C (HCC) 07/27/2013    Elevated liver function tests 07/27/2013   Overweight (BMI 25.0-29.9) 07/27/2013   Eczema 12/09/2011   Status post gastric bypass for obesity 05/30/2011   Status post bariatric surgery 05/30/2011   Past Medical History:  Diagnosis Date   Allergy    all rhinitis   Dependent edema    Dermatitis    GERD (gastroesophageal reflux disease)    Hypertension     Family History  Problem Relation Age of Onset   Hyperlipidemia Mother    Non-Hodgkin's lymphoma Father    Diabetes Maternal Grandmother    Colon cancer Neg Hx    Stomach cancer Neg Hx    Esophageal cancer Neg Hx    Colon polyps Neg Hx    Pancreatic cancer Neg Hx    Breast cancer Neg Hx     Past Surgical History:  Procedure Laterality Date   CESAREAN SECTION     x2   TUBAL LIGATION     Social History   Occupational History   Occupation: Arts administrator  Tobacco Use   Smoking status: Former    Types: Cigarettes    Quit date: 08/29/1991    Years since quitting: 30.7   Smokeless tobacco: Never  Vaping Use   Vaping Use: Never used  Substance and Sexual Activity   Alcohol use: Not Currently    Comment: Patient drinks 2 40oz beers multiple days a week   Drug use: No   Sexual activity: Not on file

## 2022-06-24 ENCOUNTER — Other Ambulatory Visit: Payer: Self-pay | Admitting: Hematology

## 2022-06-25 ENCOUNTER — Telehealth: Payer: Self-pay | Admitting: Hematology

## 2022-06-26 ENCOUNTER — Encounter: Payer: Self-pay | Admitting: Hematology

## 2022-06-27 ENCOUNTER — Other Ambulatory Visit: Payer: Self-pay

## 2022-06-27 DIAGNOSIS — D472 Monoclonal gammopathy: Secondary | ICD-10-CM

## 2022-06-28 ENCOUNTER — Inpatient Hospital Stay: Payer: Commercial Managed Care - HMO | Attending: Hematology

## 2022-06-28 ENCOUNTER — Other Ambulatory Visit: Payer: Self-pay | Admitting: Hematology

## 2022-06-28 ENCOUNTER — Inpatient Hospital Stay: Payer: Commercial Managed Care - HMO | Admitting: Hematology

## 2022-06-28 DIAGNOSIS — D472 Monoclonal gammopathy: Secondary | ICD-10-CM

## 2022-09-07 ENCOUNTER — Encounter: Payer: Self-pay | Admitting: Hematology

## 2022-09-17 ENCOUNTER — Inpatient Hospital Stay (HOSPITAL_BASED_OUTPATIENT_CLINIC_OR_DEPARTMENT_OTHER): Payer: Commercial Managed Care - HMO | Admitting: Hematology

## 2022-09-17 ENCOUNTER — Inpatient Hospital Stay: Payer: Commercial Managed Care - HMO | Attending: Hematology

## 2022-09-17 VITALS — BP 128/97 | HR 73 | Temp 97.5°F | Resp 20 | Wt 171.9 lb

## 2022-09-17 DIAGNOSIS — Z79899 Other long term (current) drug therapy: Secondary | ICD-10-CM | POA: Diagnosis not present

## 2022-09-17 DIAGNOSIS — D649 Anemia, unspecified: Secondary | ICD-10-CM | POA: Diagnosis not present

## 2022-09-17 DIAGNOSIS — D539 Nutritional anemia, unspecified: Secondary | ICD-10-CM | POA: Diagnosis present

## 2022-09-17 DIAGNOSIS — E538 Deficiency of other specified B group vitamins: Secondary | ICD-10-CM | POA: Diagnosis present

## 2022-09-17 DIAGNOSIS — D472 Monoclonal gammopathy: Secondary | ICD-10-CM | POA: Insufficient documentation

## 2022-09-17 DIAGNOSIS — D508 Other iron deficiency anemias: Secondary | ICD-10-CM

## 2022-09-17 DIAGNOSIS — Z87891 Personal history of nicotine dependence: Secondary | ICD-10-CM | POA: Insufficient documentation

## 2022-09-17 LAB — CBC WITH DIFFERENTIAL (CANCER CENTER ONLY)
Abs Immature Granulocytes: 0.02 10*3/uL (ref 0.00–0.07)
Basophils Absolute: 0 10*3/uL (ref 0.0–0.1)
Basophils Relative: 1 %
Eosinophils Absolute: 0.1 10*3/uL (ref 0.0–0.5)
Eosinophils Relative: 2 %
HCT: 33.8 % — ABNORMAL LOW (ref 36.0–46.0)
Hemoglobin: 10.8 g/dL — ABNORMAL LOW (ref 12.0–15.0)
Immature Granulocytes: 0 %
Lymphocytes Relative: 34 %
Lymphs Abs: 2.1 10*3/uL (ref 0.7–4.0)
MCH: 32.1 pg (ref 26.0–34.0)
MCHC: 32 g/dL (ref 30.0–36.0)
MCV: 100.6 fL — ABNORMAL HIGH (ref 80.0–100.0)
Monocytes Absolute: 0.3 10*3/uL (ref 0.1–1.0)
Monocytes Relative: 5 %
Neutro Abs: 3.5 10*3/uL (ref 1.7–7.7)
Neutrophils Relative %: 58 %
Platelet Count: 171 10*3/uL (ref 150–400)
RBC: 3.36 MIL/uL — ABNORMAL LOW (ref 3.87–5.11)
RDW: 13.2 % (ref 11.5–15.5)
WBC Count: 6 10*3/uL (ref 4.0–10.5)
nRBC: 0 % (ref 0.0–0.2)

## 2022-09-17 LAB — CMP (CANCER CENTER ONLY)
ALT: 28 U/L (ref 0–44)
AST: 28 U/L (ref 15–41)
Albumin: 3.9 g/dL (ref 3.5–5.0)
Alkaline Phosphatase: 179 U/L — ABNORMAL HIGH (ref 38–126)
Anion gap: 4 — ABNORMAL LOW (ref 5–15)
BUN: 22 mg/dL (ref 8–23)
CO2: 24 mmol/L (ref 22–32)
Calcium: 9.1 mg/dL (ref 8.9–10.3)
Chloride: 109 mmol/L (ref 98–111)
Creatinine: 0.81 mg/dL (ref 0.44–1.00)
GFR, Estimated: >60 mL/min (ref >60)
Glucose, Bld: 150 mg/dL — ABNORMAL HIGH (ref 70–99)
Potassium: 4.9 mmol/L (ref 3.5–5.1)
Sodium: 137 mmol/L (ref 135–145)
Total Bilirubin: 0.3 mg/dL (ref 0.3–1.2)
Total Protein: 7.9 g/dL (ref 6.5–8.1)

## 2022-09-17 LAB — RETICULOCYTES
Immature Retic Fract: 9.9 % (ref 2.3–15.9)
RBC.: 3.4 MIL/uL — ABNORMAL LOW (ref 3.87–5.11)
Retic Count, Absolute: 45.2 10*3/uL (ref 19.0–186.0)
Retic Ct Pct: 1.3 % (ref 0.4–3.1)

## 2022-09-17 LAB — IRON AND IRON BINDING CAPACITY (CC-WL,HP ONLY)
Iron: 81 ug/dL (ref 28–170)
Saturation Ratios: 23 % (ref 10.4–31.8)
TIBC: 346 ug/dL (ref 250–450)
UIBC: 265 ug/dL (ref 148–442)

## 2022-09-17 LAB — VITAMIN B12: Vitamin B-12: >7500 pg/mL — ABNORMAL HIGH (ref 180–914)

## 2022-09-17 LAB — FERRITIN: Ferritin: 53 ng/mL (ref 11–307)

## 2022-09-17 NOTE — Progress Notes (Signed)
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 female who has been referred to [place] by Dr Belva Agee for evaluation and management of anemia. The pt reports that she is doing well overall.  The pt reports that she was first given the diagnoses of anemia about 10 years ago. Pt is currently taking 1 iron pill every other day. Pt had a gastric bypass surgery in 2014. She initially weighed about 400 lbs and is currently 164 lbs. She has been experiencing a severe lack of appetite and weight loss. When she does attempt to eat she can only take a couple of bites and then has a bowel movement soon after that. She also has abdominal pain after she eats. Pt has seasonal allergies and eczema, for which she has been taking Dupixent for 3 months. Pt has never had a skin biopsy to confirm her eczema diagnosis. It was dormant for about 10 years and came back 2 years ago after an interaction with a cat and has persisted. Pt also takes Vistaril to help with the itch. Pt has never had an allergy test or immune therapy testing. Pt took steroid shots for her carpal tunnel. There were pre-cancerous cells found in pt's pap smear and her Gynecologist is planning on doing a resection. Pt was experiencing black stools a couple of months ago, that were not painful and did not speak with her PCP about it. She had a colonoscopy in 2014 and has never had an endoscopy. Pt believes her ankle swelling is from work and wears compression socks which helps.   Most recent lab results (08/12/2018) of CBC is as follows: WBC at 3.8K, RBC at 2.49, Hgb at 7.8, HCT at 24.3, MCV at 98, MCH at 31.3, MCHC at 32.1, RDW at 15.9, Platelets at 182K.  08/12/2018 Transferrin is  286 08/12/2018 Ferritin is 30  08/12/2018 Iron and TIBC shows Iron Bind, Cap, (TIBC) at 349, UIBC at 303, Iron at 46, Iron Sat at 13.   On review of systems, pt reports pain in her left knee, weight loss, fatigue, enlarged cervical lymph nodes, black/bloody stools, abdominal pain and denies vaginal bleeds, nose bleeds gum bleeds, chest pain, SOB, fevers, chills and any other symptoms.   On PMHx the pt reports gastric bypass surgery, two cesarean sections, tubal ligation.  On Social Hx the pt reports that she has quit smoking, no alcohol use.   INTERVAL HISTORY:  Debra Jacobson is a 62 y.o. female who is here for continued evaluation and management of smoldering myeloma and multifactorial anemia.  I had a phone visit with the patient on 09/26/2021 and she complained of leg pain in the evening, but was doing well overall.   Patient notes she has been doing well overall without any severe medical concerns. She does complain of joint pain. She follows-up with her PCP regarding joint pain.   She denies any recent infection issues, fever, chills, night sweats, unexpected weight loss, abdominal pain, chest pain, back pain, or leg swelling.   Patient had back X-ray around 2 months ago, which showed arthritis.   She notes she recently got divorced and she quit drinking alcohol.    MEDICAL HISTORY:  Past Medical History:  Diagnosis Date   Allergy  all rhinitis   Dependent edema    Dermatitis    GERD (gastroesophageal reflux disease)    Hypertension     SURGICAL HISTORY: Past Surgical History:  Procedure Laterality Date   CESAREAN SECTION     x2   TUBAL LIGATION      SOCIAL HISTORY: Social History   Socioeconomic History   Marital status: Legally Separated    Spouse name: Not on file   Number of children: 3   Years of education: Not on file   Highest education level: Not on file  Occupational History   Occupation: Travel nurse  Tobacco Use   Smoking status: Former     Current packs/day: 0.00    Types: Cigarettes    Quit date: 08/29/1991    Years since quitting: 31.0   Smokeless tobacco: Never  Vaping Use   Vaping status: Never Used  Substance and Sexual Activity   Alcohol use: Not Currently    Comment: Patient drinks 2 40oz beers multiple days a week   Drug use: No   Sexual activity: Not on file  Other Topics Concern   Not on file  Social History Narrative   Not on file   Social Determinants of Health   Financial Resource Strain: Not on file  Food Insecurity: Not on file  Transportation Needs: Not on file  Physical Activity: Not on file  Stress: Not on file  Social Connections: Unknown (05/11/2021)   Received from [facility], [facility]   Social Network    Social Network: Not on file  Intimate Partner Violence: Unknown (04/06/2021)   Received from [facility], [facility]   HITS    Physically Hurt: Not on file    Insult or Talk Down To: Not on file    Threaten Physical Harm: Not on file    Scream or Curse: Not on file    FAMILY HISTORY: Family History  Problem Relation Age of Onset   Hyperlipidemia Mother    Non-Hodgkin's lymphoma Father    Diabetes Maternal Grandmother    Colon cancer Neg Hx    Stomach cancer Neg Hx    Esophageal cancer Neg Hx    Colon polyps Neg Hx    Pancreatic cancer Neg Hx    Breast cancer Neg Hx     ALLERGIES:  has No Known Allergies.  MEDICATIONS:  Current Outpatient Medications  Medication Sig Dispense Refill   DODEX 1000 MCG/ML injection INJECT 1 MLS EVERY WEEK FOR 4 WEEKS THEN 1 ML MONTHLY THEREAFTER 6 mL 1   DUPIXENT 300 MG/2ML prefilled syringe Inject 300 mg into the skin every 14 (fourteen) days.     furosemide (LASIX) 40 MG tablet Take 40 mg by mouth as needed.     gabapentin (NEURONTIN) 100 MG capsule Take 200mg  (2 caps) in the morning and 400mg  (4 caps) at bedtime 180 capsule 2   meloxicam (MOBIC) 15 MG tablet Take 15 mg by mouth daily.     methocarbamol (ROBAXIN) 500 MG tablet  TAKE 1 TABLET BY MOUTH FOUR TIMES DAILY AS NEEDED FOR MUSCLE SPASM     omeprazole (PRILOSEC) 40 MG capsule TAKE 1 CAPSULE(40 MG) BY MOUTH DAILY 30 capsule 5   Pancrelipase, Lip-Prot-Amyl, (CREON) 24000-76000 units CPEP Take 2 capsules (48,000 Units total) by mouth with breakfast, with lunch, and with evening meal AND 1 capsule (24,000 Units total) with snacks. 180 capsule 3   Vitamin D, Ergocalciferol, (DRISDOL) 1.25 MG (50000 UNIT) CAPS capsule TAKE 1 CAPSULE BY MOUTH  WEEKLY 12 capsule 4   No current facility-administered medications for this visit.    REVIEW OF SYSTEMS:   10 Point review of Systems was done is negative except as noted above.  PHYSICAL EXAMINATION: .BP (!) 128/97   Pulse 73   Temp (!) 97.5 F (36.4 C)   Resp 20   Wt 171 lb 14.4 oz (78 kg)   LMP 04/14/2012   SpO2 100%   BMI 26.92 kg/m  . GENERAL:alert, in no acute distress and comfortable SKIN: no acute rashes, no significant lesions EYES: conjunctiva are pink and non-injected, sclera anicteric OROPHARYNX: MMM, no exudates, no oropharyngeal erythema or ulceration NECK: supple, no JVD LYMPH:  no palpable lymphadenopathy in the cervical, axillary or inguinal regions LUNGS: clear to auscultation b/l with normal respiratory effort HEART: regular rate & rhythm ABDOMEN:  normoactive bowel sounds , non tender, not distended. Extremity: no pedal edema PSYCH: alert & oriented x 3 with fluent speech NEURO: no focal motor/sensory deficits   LABORATORY DATA:  I have reviewed the data as listed  .    Latest Ref Rng & Units 09/17/2022   12:50 PM 09/25/2021    9:30 AM 08/14/2021   11:03 AM  CBC  WBC 4.0 - 10.5 K/uL 6.0  6.1  7.4   Hemoglobin 12.0 - 15.0 g/dL 16.1  09.6  04.5   Hematocrit 36.0 - 46.0 % 33.8  32.8  32.2   Platelets 150 - 400 K/uL 171  140  152    . CBC    Component Value Date/Time   WBC 6.1 09/25/2021 0930   WBC 6.5 07/28/2021 0810   RBC 3.21 (L) 09/25/2021 0930   RBC 3.18 (L) 09/25/2021 0930    HGB 10.7 (L) 09/25/2021 0930   HCT 32.8 (L) 09/25/2021 0930   HCT 26.5 (L) 02/13/2021 1258   PLT 140 (L) 09/25/2021 0930   MCV 103.1 (H) 09/25/2021 0930   MCH 33.6 09/25/2021 0930   MCHC 32.6 09/25/2021 0930   RDW 12.4 09/25/2021 0930   LYMPHSABS 1.6 09/25/2021 0930   MONOABS 0.6 09/25/2021 0930   EOSABS 0.1 09/25/2021 0930   BASOSABS 0.0 09/25/2021 0930    .    Latest Ref Rng & Units 09/17/2022   12:50 PM 09/25/2021    9:30 AM 08/14/2021   11:03 AM  CMP  Glucose 70 - 99 mg/dL 409  99  96   BUN 8 - 23 mg/dL 22  22  17    Creatinine 0.44 - 1.00 mg/dL 8.11  9.14  7.82   Sodium 135 - 145 mmol/L 137  135  134   Potassium 3.5 - 5.1 mmol/L 4.9  4.6  4.9   Chloride 98 - 111 mmol/L 109  108  110   CO2 22 - 32 mmol/L 24  25  24    Calcium 8.9 - 10.3 mg/dL 9.1  9.2  9.2   Total Protein 6.5 - 8.1 g/dL 7.9  7.7  7.7   Total Bilirubin 0.3 - 1.2 mg/dL 0.3  0.2  0.3   Alkaline Phos 38 - 126 U/L 179  167  106   AST 15 - 41 U/L 28  26  30    ALT 0 - 44 U/L 28  29  27     . Lab Results  Component Value Date   IRON 81 09/17/2022   TIBC 346 09/17/2022   IRONPCTSAT 23 09/17/2022   (Iron and TIBC)  Lab Results  Component Value Date   FERRITIN 88  09/17/2022   09/22/2018 Bone Marrow Report   09/22/2018 Bone Marrow report    09/22/2018 FISH Analysis    09/22/2018 Cytogenetics  Cytogenetics done 07/28/2021 revealed    Molecular pathology done 07/28/2021 revealed   Bone marrow biopsy done 07/28/2021 revealed "DIAGNOSIS:   BONE MARROW, ASPIRATE, CLOT, CORE:  -Variably cellular bone marrow with plasma cell neoplasm  -See comment   PERIPHERAL BLOOD:  - Macrocytic anemia   COMMENT:   The bone marrow is generally normocellular for age with increased number  of plasma cells representing 11% of all cells in the aspirate with lack  of large aggregates or sheets in the clot/biopsy sections.  The plasma  cells display kappa light chain restriction consistent with plasma cell   neoplasm.  The background shows trilineage hematopoiesis with generally  nonspecific changes possibly related to history of liver disease,  alcohol consumption, etc.  Nonetheless, correlation with cytogenetic and  FISH studies is recommended."  RADIOGRAPHIC STUDIES: I have personally reviewed the radiological images as listed and agreed with the findings in the report. No results found.  ASSESSMENT & PLAN:   1) significant macrocytic anemia likely multifactorial but significant concern for liver cirrhosis and alcohol abuse.  Other factors Vitamin B12 deficiency and other potential nutritional deficiencyes caused by Gastric bypass surgery . Also had dyserythropoiesis on BM Bx.  Currently active significant alcohol abuse and liver disease are also significant contributing factors. 2) Iron deficiency- GI blood loss vs poor absorption related to Gastric bypass surgery. 09/17/2018 Occult blood card to lab is "POSITIVE" x2.  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  Recent admission with confusion and hepatic encephalopathy. 6) noncompliance with medical follow-up 7) Leg swelling -Down with diuresis. -[place] results not suggestive of DVT  PLAN: -Discussed lab results from today, 09/17/2022, with the patient. CBC shows decreased hemoglobin at 10.8 g/dL and decreased hematocrit at 33.8%, but stable since last visit. CMP shows elevated glucose level of 150 and elevated alkaline phosphate  level at 179. M spike stable at 1g/dl -No lab or clinical symptoms to suggest progression to myeloma Ferritin 53 with iron sat of 23%. - no indication for IV iron at this time. -encourage iron rich foods. B12 wnl Phone visit with Dr Candise Che in 6 months Labs 1 week prior to phone visit   FOLLOW-UP: Phone visit with Dr Candise Che in 6 months Labs 1 week prior to phone visit   The total time spent in the appointment was 21 minutes* .  All of the patient's questions were answered with apparent satisfaction. The patient knows to call the clinic with any problems, questions or concerns.   Wyvonnia Lora MD MS AAHIVMS [facility] Hematology/Oncology Physician [facility]  .*Total Encounter Time as defined by the Centers for Medicare and Medicaid Services includes, in addition to the face-to-face time of a patient visit (documented in the note above) non-face-to-face time: obtaining and reviewing outside history, ordering and reviewing medications, tests or procedures, care coordination (communications with other health care professionals or caregivers) and documentation in the medical record.   I,Param Shah,acting as a Neurosurgeon  for Wyvonnia Lora, MD.,have documented all relevant documentation on the behalf of Wyvonnia Lora, MD,as directed by  Wyvonnia Lora, MD while in the presence of Wyvonnia Lora, MD.   .I have reviewed the above documentation for accuracy and completeness, and I agree with the above. Johney Maine MD

## 2022-09-18 LAB — KAPPA/LAMBDA LIGHT CHAINS
Kappa free light chain: 58.6 mg/L — ABNORMAL HIGH (ref 3.3–19.4)
Kappa, lambda light chain ratio: 3.15 — ABNORMAL HIGH (ref 0.26–1.65)
Lambda free light chains: 18.6 mg/L (ref 5.7–26.3)

## 2022-09-19 ENCOUNTER — Other Ambulatory Visit: Payer: Self-pay | Admitting: Hematology

## 2022-09-19 ENCOUNTER — Other Ambulatory Visit: Payer: Self-pay | Admitting: Internal Medicine

## 2022-09-19 DIAGNOSIS — Z1231 Encounter for screening mammogram for malignant neoplasm of breast: Secondary | ICD-10-CM

## 2022-09-20 ENCOUNTER — Other Ambulatory Visit: Payer: Self-pay

## 2022-09-20 DIAGNOSIS — D472 Monoclonal gammopathy: Secondary | ICD-10-CM

## 2022-09-20 MED ORDER — GABAPENTIN 100 MG PO CAPS
ORAL_CAPSULE | ORAL | 2 refills | Status: DC
Start: 2022-09-20 — End: 2023-03-26

## 2022-09-21 LAB — MULTIPLE MYELOMA PANEL, SERUM
Albumin SerPl Elph-Mcnc: 3.7 g/dL (ref 2.9–4.4)
Albumin/Glob SerPl: 1.1 (ref 0.7–1.7)
Alpha 1: 0.2 g/dL (ref 0.0–0.4)
Alpha2 Glob SerPl Elph-Mcnc: 0.7 g/dL (ref 0.4–1.0)
B-Globulin SerPl Elph-Mcnc: 0.8 g/dL (ref 0.7–1.3)
Gamma Glob SerPl Elph-Mcnc: 1.9 g/dL — ABNORMAL HIGH (ref 0.4–1.8)
Globulin, Total: 3.7 g/dL (ref 2.2–3.9)
IgA: 30 mg/dL — ABNORMAL LOW (ref 87–352)
IgG (Immunoglobin G), Serum: 2038 mg/dL — ABNORMAL HIGH (ref 586–1602)
IgM (Immunoglobulin M), Srm: 388 mg/dL — ABNORMAL HIGH (ref 26–217)
M Protein SerPl Elph-Mcnc: 1 g/dL — ABNORMAL HIGH
Total Protein ELP: 7.4 g/dL (ref 6.0–8.5)

## 2022-09-23 ENCOUNTER — Encounter: Payer: Self-pay | Admitting: Hematology

## 2022-10-01 ENCOUNTER — Ambulatory Visit (INDEPENDENT_AMBULATORY_CARE_PROVIDER_SITE_OTHER): Payer: Commercial Managed Care - HMO | Admitting: Orthopedic Surgery

## 2022-10-01 ENCOUNTER — Encounter: Payer: Self-pay | Admitting: Orthopedic Surgery

## 2022-10-01 DIAGNOSIS — M25511 Pain in right shoulder: Secondary | ICD-10-CM | POA: Diagnosis not present

## 2022-10-01 DIAGNOSIS — M25562 Pain in left knee: Secondary | ICD-10-CM

## 2022-10-01 DIAGNOSIS — G8929 Other chronic pain: Secondary | ICD-10-CM | POA: Diagnosis not present

## 2022-10-01 DIAGNOSIS — M25561 Pain in right knee: Secondary | ICD-10-CM | POA: Diagnosis not present

## 2022-10-01 MED ORDER — METHYLPREDNISOLONE ACETATE 40 MG/ML IJ SUSP
40.0000 mg | INTRAMUSCULAR | Status: AC | PRN
Start: 2022-10-01 — End: 2022-10-01
  Administered 2022-10-01: 40 mg via INTRA_ARTICULAR

## 2022-10-01 MED ORDER — LIDOCAINE HCL (PF) 1 % IJ SOLN
5.0000 mL | INTRAMUSCULAR | Status: AC | PRN
Start: 2022-10-01 — End: 2022-10-01
  Administered 2022-10-01: 5 mL

## 2022-10-01 MED ORDER — LIDOCAINE HCL 1 % IJ SOLN
5.0000 mL | INTRAMUSCULAR | Status: AC | PRN
Start: 2022-10-01 — End: 2022-10-01
  Administered 2022-10-01: 5 mL

## 2022-10-01 NOTE — Progress Notes (Signed)
Office Visit Note   Patient: Debra Jacobson           Date of Birth: [birth year]           MRN: [id number] Visit Date: 10/01/2022              Requested by: Ollen Bowl, MD [address] [place],  [place] [address] PCP: Ollen Bowl, MD  Chief Complaint  Patient presents with   Right Shoulder - Pain   Left Knee - Pain   Left Shoulder - Pain   Right Knee - Pain      HPI: Patient is a 62 year old woman who presents with chronic shoulder pain right worse than left.  She also has chronic knee pain left worse than right.  Assessment & Plan: Visit Diagnoses:  1. Chronic right shoulder pain   2. Chronic pain of right knee     Plan: Patient tolerated the left knee injection and right shoulder injection.  She will follow-up if symptoms worsen.  Follow-Up Instructions: Return if symptoms worsen or fail to improve.   Ortho Exam  Patient is alert, oriented, no adenopathy, well-dressed, normal affect, normal respiratory effort. Review of the radiographs of the right shoulder shows glenohumeral arthritis with decreased joint space.  Review of the radiographs of both knees shows subcondylar sclerosis.  Patient is tender to palpation over the medial joint line of the left knee collaterals and cruciates are stable.  There is no effusion.  Right shoulder she has pain with Neer and Hawkins impingement test pain to palpation of the biceps tendon.  Imaging: No results found. No images are attached to the encounter.  Labs: Lab Results  Component Value Date   HGBA1C 5.5 10/26/2011   HGBA1C 5.4 09/10/2011   ESRSEDRATE 14 03/28/2021   ESRSEDRATE 37 (H) 09/01/2018   LABURIC 9.3 (H) 03/28/2021     Lab Results  Component Value Date   ALBUMIN 3.9 09/17/2022   ALBUMIN 3.7 09/25/2021   ALBUMIN 3.8 08/14/2021   PREALBUMIN 9.1 (L) 10/26/2011    Lab Results  Component Value Date   MG 2.1 01/31/2021   MG 1.7 05/20/2019   Lab Results  Component Value Date    VD25OH 27 (L) 03/28/2021   VD25OH 45 03/01/2011    Lab Results  Component Value Date   PREALBUMIN 9.1 (L) 10/26/2011      Latest Ref Rng & Units 09/17/2022   12:52 PM 09/17/2022   12:50 PM 09/25/2021    9:30 AM  CBC EXTENDED  WBC 4.0 - 10.5 K/uL  6.0  6.1   RBC 3.87 - 5.11 MIL/uL 3.40  3.36  3.21    3.18   Hemoglobin 12.0 - 15.0 g/dL  19.1  47.8   HCT 29.5 - 46.0 %  33.8  32.8   Platelets 150 - 400 K/uL  171  140   NEUT# 1.7 - 7.7 K/uL  3.5  3.9   Lymph# 0.7 - 4.0 K/uL  2.1  1.6      There is no height or weight on file to calculate BMI.  Orders:  No orders of the defined types were placed in this encounter.  No orders of the defined types were placed in this encounter.    Procedures: Large Joint Inj: L knee on 10/01/2022 12:12 PM Indications: pain and diagnostic evaluation Details: 22 G 1.5 in needle, anteromedial approach  Arthrogram: No  Medications: 5 mL lidocaine (PF) 1 %; 40 mg methylPREDNISolone  acetate 40 MG/ML Outcome: tolerated well, no immediate complications Procedure, treatment alternatives, risks and benefits explained, specific risks discussed. Consent was given by the patient. Immediately prior to procedure a time out was called to verify the correct patient, procedure, equipment, support staff and site/side marked as required. Patient was prepped and draped in the usual sterile fashion.    Large Joint Inj: R subacromial bursa on 10/01/2022 12:12 PM Indications: diagnostic evaluation and pain Details: 22 G 1.5 in needle, posterior approach  Arthrogram: No  Medications: 5 mL lidocaine 1 %; 40 mg methylPREDNISolone acetate 40 MG/ML Outcome: tolerated well, no immediate complications Procedure, treatment alternatives, risks and benefits explained, specific risks discussed. Consent was given by the patient. Immediately prior to procedure a time out was called to verify the correct patient, procedure, equipment, support staff and site/side marked as  required. Patient was prepped and draped in the usual sterile fashion.      Clinical Data: No additional findings.  ROS:  All other systems negative, except as noted in the HPI. Review of Systems  Objective: Vital Signs: LMP 04/14/2012   Specialty Comments:  No specialty comments available.  PMFS History: Patient Active Problem List   Diagnosis Date Noted   Chronic pain 02/09/2022   Pain in left knee 01/30/2022   Acute alcoholic pancreatitis 05/20/2019   Alcohol dependence (HCC) 05/20/2019   Iron deficiency anemia 09/10/2018   Abnormal cervical Papanicolaou smear 08/15/2018   Anemia 08/11/2018   Chronic hepatitis C (HCC) 07/27/2013   Elevated liver function tests 07/27/2013   Overweight (BMI 25.0-29.9) 07/27/2013   Eczema 12/09/2011   Status post gastric bypass for obesity 05/30/2011   Status post bariatric surgery 05/30/2011   Past Medical History:  Diagnosis Date   Allergy    all rhinitis   Dependent edema    Dermatitis    GERD (gastroesophageal reflux disease)    Hypertension     Family History  Problem Relation Age of Onset   Hyperlipidemia Mother    Non-Hodgkin's lymphoma Father    Diabetes Maternal Grandmother    Colon cancer Neg Hx    Stomach cancer Neg Hx    Esophageal cancer Neg Hx    Colon polyps Neg Hx    Pancreatic cancer Neg Hx    Breast cancer Neg Hx     Past Surgical History:  Procedure Laterality Date   CESAREAN SECTION     x2   TUBAL LIGATION     Social History   Occupational History   Occupation: Arts administrator  Tobacco Use   Smoking status: Former    Current packs/day: 0.00    Types: Cigarettes    Quit date: 08/29/1991    Years since quitting: 31.1   Smokeless tobacco: Never  Vaping Use   Vaping status: Never Used  Substance and Sexual Activity   Alcohol use: Not Currently    Comment: Patient drinks 2 40oz beers multiple days a week   Drug use: No   Sexual activity: Not on file

## 2022-10-19 ENCOUNTER — Other Ambulatory Visit: Payer: Self-pay | Admitting: Internal Medicine

## 2022-10-19 DIAGNOSIS — K2970 Gastritis, unspecified, without bleeding: Secondary | ICD-10-CM

## 2022-10-22 ENCOUNTER — Encounter: Payer: Self-pay | Admitting: Hematology

## 2022-10-24 ENCOUNTER — Ambulatory Visit
Admission: RE | Admit: 2022-10-24 | Discharge: 2022-10-24 | Disposition: A | Discharge status: Home / Self Care | Payer: Managed Care, Other (non HMO) | Source: Ambulatory Visit | Attending: Internal Medicine

## 2022-10-24 ENCOUNTER — Encounter: Payer: Self-pay | Admitting: Hematology

## 2022-10-24 ENCOUNTER — Other Ambulatory Visit: Payer: Self-pay | Admitting: Internal Medicine

## 2022-10-24 DIAGNOSIS — M79672 Pain in left foot: Secondary | ICD-10-CM

## 2022-10-29 ENCOUNTER — Encounter: Payer: Self-pay | Admitting: Hematology

## 2022-10-29 ENCOUNTER — Ambulatory Visit
Admission: RE | Admit: 2022-10-29 | Discharge: 2022-10-29 | Disposition: A | Discharge status: Home / Self Care | Payer: Managed Care, Other (non HMO) | Source: Ambulatory Visit | Attending: Internal Medicine | Admitting: Internal Medicine

## 2022-10-29 ENCOUNTER — Other Ambulatory Visit: Payer: Self-pay | Admitting: Internal Medicine

## 2022-10-29 DIAGNOSIS — Z1231 Encounter for screening mammogram for malignant neoplasm of breast: Secondary | ICD-10-CM

## 2022-12-07 ENCOUNTER — Other Ambulatory Visit: Payer: Self-pay | Admitting: Hematology

## 2023-02-25 ENCOUNTER — Encounter: Payer: Self-pay | Admitting: Hematology

## 2023-02-28 ENCOUNTER — Encounter: Payer: Self-pay | Admitting: Hematology

## 2023-02-28 ENCOUNTER — Observation Stay (HOSPITAL_COMMUNITY)
Admission: EM | Admit: 2023-02-28 | Discharge: 2023-03-01 | Disposition: A | Discharge status: Home / Self Care | Payer: No Typology Code available for payment source | Attending: Internal Medicine | Admitting: Internal Medicine

## 2023-02-28 ENCOUNTER — Emergency Department (HOSPITAL_COMMUNITY): Payer: No Typology Code available for payment source

## 2023-02-28 ENCOUNTER — Other Ambulatory Visit: Payer: Self-pay

## 2023-02-28 ENCOUNTER — Encounter (HOSPITAL_COMMUNITY): Payer: Self-pay

## 2023-02-28 DIAGNOSIS — I9589 Other hypotension: Secondary | ICD-10-CM | POA: Diagnosis not present

## 2023-02-28 DIAGNOSIS — N179 Acute kidney failure, unspecified: Secondary | ICD-10-CM | POA: Diagnosis not present

## 2023-02-28 DIAGNOSIS — K625 Hemorrhage of anus and rectum: Secondary | ICD-10-CM | POA: Diagnosis present

## 2023-02-28 DIAGNOSIS — K921 Melena: Secondary | ICD-10-CM

## 2023-02-28 DIAGNOSIS — Z79899 Other long term (current) drug therapy: Secondary | ICD-10-CM | POA: Insufficient documentation

## 2023-02-28 DIAGNOSIS — K5731 Diverticulosis of large intestine without perforation or abscess with bleeding: Secondary | ICD-10-CM | POA: Diagnosis not present

## 2023-02-28 DIAGNOSIS — K922 Gastrointestinal hemorrhage, unspecified: Secondary | ICD-10-CM | POA: Diagnosis not present

## 2023-02-28 DIAGNOSIS — Z87891 Personal history of nicotine dependence: Secondary | ICD-10-CM | POA: Insufficient documentation

## 2023-02-28 DIAGNOSIS — Z8719 Personal history of other diseases of the digestive system: Secondary | ICD-10-CM | POA: Diagnosis not present

## 2023-02-28 DIAGNOSIS — M1991 Primary osteoarthritis, unspecified site: Secondary | ICD-10-CM | POA: Diagnosis not present

## 2023-02-28 DIAGNOSIS — D62 Acute posthemorrhagic anemia: Secondary | ICD-10-CM

## 2023-02-28 DIAGNOSIS — K219 Gastro-esophageal reflux disease without esophagitis: Secondary | ICD-10-CM | POA: Diagnosis not present

## 2023-02-28 LAB — COMPREHENSIVE METABOLIC PANEL
ALT: 28 U/L (ref 0–44)
AST: 29 U/L (ref 15–41)
Albumin: 3.1 g/dL — ABNORMAL LOW (ref 3.5–5.0)
Alkaline Phosphatase: 123 U/L (ref 38–126)
Anion gap: 8 (ref 5–15)
BUN: 22 mg/dL (ref 8–23)
CO2: 19 mmol/L — ABNORMAL LOW (ref 22–32)
Calcium: 8.4 mg/dL — ABNORMAL LOW (ref 8.9–10.3)
Chloride: 109 mmol/L (ref 98–111)
Creatinine, Ser: 1.21 mg/dL — ABNORMAL HIGH (ref 0.44–1.00)
GFR, Estimated: 50 mL/min — ABNORMAL LOW (ref >60)
Glucose, Bld: 141 mg/dL — ABNORMAL HIGH (ref 70–99)
Potassium: 3.8 mmol/L (ref 3.5–5.1)
Sodium: 136 mmol/L (ref 135–145)
Total Bilirubin: 0.4 mg/dL (ref 0.0–1.2)
Total Protein: 7.2 g/dL (ref 6.5–8.1)

## 2023-02-28 LAB — POC OCCULT BLOOD, ED: Fecal Occult Bld: POSITIVE — AB

## 2023-02-28 LAB — CBC WITH DIFFERENTIAL/PLATELET
Abs Immature Granulocytes: 0.05 10*3/uL (ref 0.00–0.07)
Basophils Absolute: 0.1 10*3/uL (ref 0.0–0.1)
Basophils Relative: 0 %
Eosinophils Absolute: 0.1 10*3/uL (ref 0.0–0.5)
Eosinophils Relative: 1 %
HCT: 27.6 % — ABNORMAL LOW (ref 36.0–46.0)
Hemoglobin: 8.3 g/dL — ABNORMAL LOW (ref 12.0–15.0)
Immature Granulocytes: 0 %
Lymphocytes Relative: 32 %
Lymphs Abs: 4 10*3/uL (ref 0.7–4.0)
MCH: 31.8 pg (ref 26.0–34.0)
MCHC: 30.1 g/dL (ref 30.0–36.0)
MCV: 105.7 fL — ABNORMAL HIGH (ref 80.0–100.0)
Monocytes Absolute: 0.9 10*3/uL (ref 0.1–1.0)
Monocytes Relative: 7 %
Neutro Abs: 7.3 10*3/uL (ref 1.7–7.7)
Neutrophils Relative %: 60 %
Platelets: 213 10*3/uL (ref 150–400)
RBC: 2.61 MIL/uL — ABNORMAL LOW (ref 3.87–5.11)
RDW: 14.4 % (ref 11.5–15.5)
WBC: 12.4 10*3/uL — ABNORMAL HIGH (ref 4.0–10.5)
nRBC: 0 % (ref 0.0–0.2)

## 2023-02-28 LAB — I-STAT CHEM 8, ED
BUN: 19 mg/dL (ref 8–23)
Calcium, Ion: 1.18 mmol/L (ref 1.15–1.40)
Chloride: 110 mmol/L (ref 98–111)
Creatinine, Ser: 1.3 mg/dL — ABNORMAL HIGH (ref 0.44–1.00)
Glucose, Bld: 138 mg/dL — ABNORMAL HIGH (ref 70–99)
HCT: 26 % — ABNORMAL LOW (ref 36.0–46.0)
Hemoglobin: 8.8 g/dL — ABNORMAL LOW (ref 12.0–15.0)
Potassium: 3.9 mmol/L (ref 3.5–5.1)
Sodium: 141 mmol/L (ref 135–145)
TCO2: 19 mmol/L — ABNORMAL LOW (ref 22–32)

## 2023-02-28 LAB — PROTIME-INR
INR: 1 (ref 0.8–1.2)
Prothrombin Time: 13.4 s (ref 11.4–15.2)

## 2023-02-28 LAB — HEMOGLOBIN AND HEMATOCRIT, BLOOD
HCT: 25.7 % — ABNORMAL LOW (ref 36.0–46.0)
HCT: 23.5 % — ABNORMAL LOW (ref 36.0–46.0)
Hemoglobin: 8 g/dL — ABNORMAL LOW (ref 12.0–15.0)
Hemoglobin: 7.1 g/dL — ABNORMAL LOW (ref 12.0–15.0)

## 2023-02-28 LAB — HIV ANTIBODY (ROUTINE TESTING W REFLEX): HIV Screen 4th Generation wRfx: NONREACTIVE

## 2023-02-28 MED ORDER — SODIUM CHLORIDE 0.9 % IV SOLN
2.0000 g | INTRAVENOUS | Status: DC
  Administered 2023-02-28: 2 g via INTRAVENOUS
  Filled 2023-02-28: qty 20

## 2023-02-28 MED ORDER — ONDANSETRON HCL 4 MG/2ML IJ SOLN: 4.0000 mg | Freq: Four times a day (QID) | INTRAMUSCULAR | Status: DC | PRN

## 2023-02-28 MED ORDER — IOHEXOL 350 MG/ML SOLN
100.0000 mL | Freq: Once | INTRAVENOUS | Status: AC | PRN
  Administered 2023-02-28: 100 mL via INTRAVENOUS

## 2023-02-28 MED ORDER — GABAPENTIN 100 MG PO CAPS
200.0000 mg | ORAL_CAPSULE | Freq: Every day | ORAL | Status: DC
  Administered 2023-03-01: 200 mg via ORAL
  Filled 2023-02-28: qty 2

## 2023-02-28 MED ORDER — SODIUM CHLORIDE 0.9 % IV BOLUS
500.0000 mL | Freq: Once | INTRAVENOUS | Status: AC
Start: 2023-02-28 — End: 2023-02-28
  Administered 2023-02-28: 500 mL via INTRAVENOUS

## 2023-02-28 MED ORDER — TRAZODONE HCL 50 MG PO TABS
50.0000 mg | ORAL_TABLET | Freq: Every evening | ORAL | Status: DC | PRN
  Filled 2023-02-28: qty 1

## 2023-02-28 MED ORDER — ALBUTEROL SULFATE (2.5 MG/3ML) 0.083% IN NEBU: 2.5000 mg | INHALATION_SOLUTION | RESPIRATORY_TRACT | Status: DC | PRN

## 2023-02-28 MED ORDER — PANTOPRAZOLE SODIUM 40 MG IV SOLR
40.0000 mg | Freq: Two times a day (BID) | INTRAVENOUS | Status: DC
  Administered 2023-02-28 – 2023-03-01 (×3): 40 mg via INTRAVENOUS
  Filled 2023-02-28 (×3): qty 10

## 2023-02-28 MED ORDER — ONDANSETRON HCL 4 MG PO TABS: 4.0000 mg | ORAL_TABLET | Freq: Four times a day (QID) | ORAL | Status: DC | PRN

## 2023-02-28 MED ORDER — GABAPENTIN 400 MG PO CAPS
400.0000 mg | ORAL_CAPSULE | Freq: Every day | ORAL | Status: DC
  Administered 2023-02-28: 400 mg via ORAL
  Filled 2023-02-28: qty 1

## 2023-02-28 MED ORDER — ACETAMINOPHEN 650 MG RE SUPP: 650.0000 mg | Freq: Four times a day (QID) | RECTAL | Status: DC | PRN

## 2023-02-28 MED ORDER — ACETAMINOPHEN 325 MG PO TABS
650.0000 mg | ORAL_TABLET | Freq: Four times a day (QID) | ORAL | Status: DC | PRN
  Administered 2023-03-01: 650 mg via ORAL
  Filled 2023-02-28: qty 2

## 2023-02-28 MED ORDER — GABAPENTIN 100 MG PO CAPS: 200.0000 mg | ORAL_CAPSULE | Freq: Two times a day (BID) | ORAL | Status: DC

## 2023-02-28 NOTE — ED Provider Notes (Signed)
 [facility] [facility]PARTMENT AT [facility] Provider Note   CSN: [id number] Arrival date & time: 02/28/23  1204     History  Chief Complaint  Patient presents with   Hematochezia   Dizziness    Debra Jacobson is a 63 y.o. female.   Dizziness    Patient has a history of acid reflux, hypertension, edema, hemorrhoids who presents to the ED for evaluation of rectal bleeding.  Patient states she noticed a couple episodes last week.  It stopped so she did not think too much of it.  Patient states today she had several episodes of rectal bleeding and she started to feel lightheaded and dizzy.  Patient does regularly take ibuprofen for orthopedic pain.  She has not had any vomiting.  No hematemesis.  No melena. Patient is a Scientist, product/process development.  She does not want any blood transfusions. Home Medications Prior to Admission medications   Medication Sig Start Date End Date Taking? Authorizing Provider  DODEX 1000 MCG/ML injection INJECT 1 MLS EVERY WEEK FOR 4 WEEKS THEN 1 ML MONTHLY THEREAFTER 04/10/22   Johney Maine, MD  DUPIXENT 300 MG/2ML prefilled syringe Inject 300 mg into the skin every 14 (fourteen) days. 04/09/19   [provider]  furosemide (LASIX) 40 MG tablet Take 40 mg by mouth as needed.    [provider]  gabapentin (NEURONTIN) 100 MG capsule Take 200mg  (2 caps) in the morning and 400mg  (4 caps) at bedtime 09/20/22   Johney Maine, MD  meloxicam (MOBIC) 15 MG tablet Take 15 mg by mouth daily.    [provider]  methocarbamol (ROBAXIN) 500 MG tablet TAKE 1 TABLET BY MOUTH FOUR TIMES DAILY AS NEEDED FOR MUSCLE SPASM    [provider]  omeprazole (PRILOSEC) 40 MG capsule TAKE 1 CAPSULE(40 MG) BY MOUTH DAILY 02/07/22   Pyrtle, Carie Caddy, MD  Pancrelipase, Lip-Prot-Amyl, (CREON) 24000-76000 units CPEP Take 2 capsules (48,000 Units total) by mouth with breakfast, with lunch, and with evening meal AND 1 capsule (24,000 Units  total) with snacks. 06/21/21   Meredith Pel, NP  Vitamin D, Ergocalciferol, (DRISDOL) 1.25 MG (50000 UNIT) CAPS capsule TAKE 1 CAPSULE BY MOUTH WEEKLY 12/07/22   Johney Maine, MD      Allergies    Patient has no known allergies.    Review of Systems   Review of Systems  Neurological:  Positive for dizziness.    Physical Exam Updated Vital Signs BP 106/63   Pulse 68   Temp 98.7 F (37.1 C) (Oral)   Resp 16   Ht 1.702 m (5\' 7" )   Wt 78 kg   LMP 04/14/2012   SpO2 100%   BMI 26.93 kg/m  Physical Exam Vitals and nursing note reviewed.  Constitutional:      Appearance: She is well-developed. She is diaphoretic.  HENT:     Head: Normocephalic and atraumatic.     Right Ear: External ear normal.     Left Ear: External ear normal.  Eyes:     General: No scleral icterus.       Right eye: No discharge.        Left eye: No discharge.     Conjunctiva/sclera: Conjunctivae normal.  Neck:     Trachea: No tracheal deviation.  Cardiovascular:     Rate and Rhythm: Normal rate.  Pulmonary:     Effort: Pulmonary effort is normal. No respiratory distress.     Breath sounds: No stridor.  Abdominal:     General: There is no distension.  Genitourinary:    Comments: No mass appreciated on rectal exam, bright red blood noted, no large clots Musculoskeletal:        General: No swelling or deformity.     Cervical back: Neck supple.  Skin:    General: Skin is warm.     Findings: No rash.  Neurological:     Mental Status: She is alert. Mental status is at baseline.     Cranial Nerves: No dysarthria or facial asymmetry.     Motor: No seizure activity.     ED Results / Procedures / Treatments   Labs (all labs ordered are listed, but only abnormal results are displayed) Labs Reviewed  COMPREHENSIVE METABOLIC PANEL - Abnormal; Notable for the following components:      Result Value   CO2 19 (*)    Glucose, Bld 141 (*)    Creatinine, Ser 1.21 (*)    Calcium 8.4 (*)     Albumin 3.1 (*)    GFR, Estimated 50 (*)    All other components within normal limits  CBC WITH DIFFERENTIAL/PLATELET - Abnormal; Notable for the following components:   WBC 12.4 (*)    RBC 2.61 (*)    Hemoglobin 8.3 (*)    HCT 27.6 (*)    MCV 105.7 (*)    All other components within normal limits  I-STAT CHEM 8, ED - Abnormal; Notable for the following components:   Creatinine, Ser 1.30 (*)    Glucose, Bld 138 (*)    TCO2 19 (*)    Hemoglobin 8.8 (*)    HCT 26.0 (*)    All other components within normal limits  POC OCCULT BLOOD, ED - Abnormal; Notable for the following components:   Fecal Occult Bld POSITIVE (*)    All other components within normal limits  PROTIME-INR    EKG None  Radiology CT angio pending  Procedures .Critical Care  Performed by: Linwood Dibbles, MD Authorized by: Linwood Dibbles, MD   Critical care provider statement:    Critical care time (minutes):  30   Critical care was time spent personally by me on the following activities:  Development of treatment plan with patient or surrogate, discussions with consultants, evaluation of patient's response to treatment, examination of patient, ordering and review of laboratory studies, ordering and review of radiographic studies, ordering and performing treatments and interventions, pulse oximetry, re-evaluation of patient's condition and review of old charts     Medications Ordered in ED Medications  sodium chloride 0.9 % bolus 500 mL (0 mLs Intravenous Stopped 02/28/23 1329)  iohexol (OMNIPAQUE) 350 MG/ML injection 100 mL (100 mLs Intravenous Contrast Given 02/28/23 1332)    ED Course/ Medical Decision Making/ A&P Clinical Course as of 02/28/23 1435  Thu Feb 28, 2023  1223 BP now over 100 systolic with patient lying down [JK]  1257 Hemoccult positive [JK]  1258 CBC with Differential(!) Hemoglobin decreased at 8.3 [JK]  1346 CAse discussed with Gunnar Fusi, [facility] [JK]  1435 Case discussed with Dr. Erenest Blank [JK]     Clinical Course User Index [JK] Linwood Dibbles, MD                                 Medical Decision Making Problems Addressed: Lower GI bleed: acute illness or injury that poses a threat to life or bodily functions  Amount and/or Complexity of  Data Reviewed Labs: ordered. Decision-making details documented in ED Course. Radiology: ordered.  Risk Prescription drug management. Decision regarding hospitalization.   Patient presented to the ED for evaluation of rectal bleeding.  Patient states she does take ibuprofen.  She has not had any melena.  No hematemesis.  She noticed a couple episodes of blood in her stool last week but it resolved.  Today she noted several episodes of rectal bleeding.  Patient was initially hypotensive on arrival.  That improved when she was placed back in the gurney without intervention.  Patient's blood pressures have been in the low range but stable.  She is not having abdominal pain.  She did have gross blood on rectal exam but is not passing any large clots.  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  Fortunately at this time she does not require transfusion.  Discussed case with  GI.  They will see patient in consultation.  I will consult the medical service for admission.  CT GI bleed study ordered.  Currently pending        Final Clinical Impression(s) / ED Diagnoses Final diagnoses:  Lower GI bleed    Rx / DC Orders ED Discharge Orders     None         Linwood Dibbles, MD 02/28/23 1436

## 2023-02-28 NOTE — ED Triage Notes (Signed)
 Pt reports that she had two episodes of bright red blood from her rectum approximately two weeks ago. Pt states that she dismissed it because she has known internal hemorrhoids. Pt states that while at the eye doctor today she had three episodes of profuse bleeding from her rectum while using the bathroom and became lightheaded. Pt endorses using 1200-1600 mg of ibuprofen daily for "a long time". Pt is a Jehovah's Witness and refuses blood products.

## 2023-02-28 NOTE — ED Notes (Signed)
 Patient transported to CT

## 2023-02-28 NOTE — Consult Note (Addendum)
 Consultation Note   Referring Provider:  [facility] PCP: Ollen Bowl, MD Primary Gastroenterologist: Erick Blinks, MD     Reason for Consultation: Lower GI bleed DOA: 02/28/2023         Hospital Day: 1   ASSESSMENT    Brief Narrative:  63 y.o. 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  Sounds like she had a vagal response after 3rd BM with blood.  Suspect she is having a diverticular hemorrhage. However, she has been consuming a large amount of NSAIDs + Meloxicam so brisk upper GI bleed ( PUD or anastomotic bleed)  a possibility though normal BUN speaks against that. Has history of cirrhosis but appears compensated without evidence for portal HTN BP has stabilized. DRE remarkable for flecks of blood in vault.   Cirrhosis based on electrography July 2023. Compensated. Probably Etoh related with negative prior workup.  No varices on screening EGD Aug 2023. No evidence for decompensation though does have a history of mild ascites on [place] in 2023. Normal platelet count and INR. Abstinent from Etoh for > 1 year  Acute on chronic macrocytic anemia  related to GI bleed Baseline hemoglobin 10.8, presents to ED at 8.3.   History of remote gastric bypass  Jehovah's Witness     PLAN:   --CT angio is pending --Empiric BID IV PPI --Monitor H+H --Keep NPO for now.  --Given history of suspected cirrhosis give start Rocephin   HPI   Brief GI history: Patient last seen by [place] 07/20/2021,  at that time it was for evaluation of newly diagnosed cirrhosis.  Based on that note patient had previously been evaluated by another GI group but did not want to sign record release form to allow [place] to get records.  Additionally,  she was not interested in further evaluation or management of cirrhosis.  A later date she did get in contact with [place].  Hepatic serologic workup was done and unremarkable except for expected positive hepatitis C antibody and also low IgA making TTG unreliable.   Interval history Patient presented to the ED with complaints of rectal bleeding with associated lightheadedness . Had a couple of episodes of rectal bleeding 2 weeks ago thought probably hemorrhoidal. Bleeding ceased. Then today she has had three episodes of painless hematochezia with a nearly 2 gram decline in hgb. With each of the 3 episodes she passed formed stool. She was hypotensive in ED.   Legacie has been taking ~ 1000mg  of Ibuprofen and also Mobic for a month or so for leg pain. She hasn't had any black stools. No abdominal pain. No N/V or other GI symptoms.    Unrelated, Ellayna no longer has problems with diarrhea ( since she stopped Etoh). Doesn't take Creon on regular basis. Weight is stable.   Previous GI Evaluations   EGD August 2023 -varices screening - LA Grade C reflux esophagitis with no bleeding. - No esophageal or gastric varices. - Roux-en-Y gastrojejunostomy with gastrojejunal anastomosis characterized by healthy appearing mucosa. Mild gastritis, biopsied. - Normal examined jejunum. - No specimens collected. Surgical [  P], gastric antrum and body - REACTIVE GASTROPATHY. - NO INFLAMMATORY PATTERN PREDICTIVE OF HELICOBACTER PYLORI INFECTION IDENTIFIED. - NEGATIVE FOR INTESTINAL METAPLASIA AND MALIGNANCY  Colonoscopy May 2023  for evaluation of weight loss and diarrhea -- Bowel prep fair to good with the exception of the cecum which could not be cleared of stool - Diverticulosis in the descending colon and in the ascending colon. Internal hemorrhoids. The examination was otherwise normal. Biopsies were taken with a cold forceps from the right colon and left colon for evaluation of microscopic colitis *Path-colonic  mucosa within normal limits   Recent Labs    02/28/23 1225 02/28/23 1232  WBC 12.4*  --   HGB 8.3* 8.8*  HCT 27.6* 26.0*  PLT 213  --    Recent Labs    02/28/23 1225 02/28/23 1232  NA 136 141  K 3.8 3.9  CL 109 110  CO2 19*  --   GLUCOSE 141* 138*  BUN 22 19  CREATININE 1.21* 1.30*  CALCIUM 8.4*  --    Recent Labs    02/28/23 1225  PROT 7.2  ALBUMIN 3.1*  AST 29  ALT 28  ALKPHOS 123  BILITOT 0.4   Recent Labs    02/28/23 1225  LABPROT 13.4  INR 1.0    Past Medical History:  Diagnosis Date   Allergy    all rhinitis   Dependent edema    Dermatitis    GERD (gastroesophageal reflux disease)    Hypertension     Past Surgical History:  Procedure Laterality Date   CESAREAN SECTION     x2   TUBAL LIGATION      Family History  Problem Relation Age of Onset   Hyperlipidemia Mother    Non-Hodgkin's lymphoma Father    Diabetes Maternal Grandmother    Colon cancer Neg Hx    Stomach cancer Neg Hx    Esophageal cancer Neg Hx    Colon polyps Neg Hx    Pancreatic cancer Neg Hx    Breast cancer Neg Hx     Prior to Admission medications   Medication Sig Start Date End Date Taking? Authorizing Provider  DODEX 1000 MCG/ML injection INJECT 1 MLS EVERY WEEK FOR 4 WEEKS THEN 1 ML MONTHLY THEREAFTER 04/10/22   Johney Maine, MD  DUPIXENT 300 MG/2ML prefilled syringe Inject 300 mg into the skin every 14 (fourteen) days. 04/09/19   [provider]  furosemide (LASIX) 40 MG tablet Take 40 mg by mouth as needed.    [provider]  gabapentin (NEURONTIN) 100 MG capsule Take 200mg  (2 caps) in the morning and 400mg  (4 caps) at bedtime 09/20/22   Johney Maine, MD  meloxicam (MOBIC) 15 MG tablet Take 15 mg by mouth daily.    [provider]  methocarbamol (ROBAXIN) 500 MG tablet TAKE 1 TABLET BY MOUTH FOUR TIMES DAILY AS NEEDED FOR MUSCLE SPASM    [provider]  omeprazole (PRILOSEC) 40 MG capsule TAKE 1 CAPSULE(40 MG) BY  MOUTH DAILY 02/07/22   Pyrtle, Carie Caddy, MD  Pancrelipase, Lip-Prot-Amyl, (CREON) 24000-76000 units CPEP Take 2 capsules (48,000 Units total) by mouth with breakfast, with lunch, and with evening meal AND 1 capsule (24,000 Units total) with snacks. 06/21/21   Meredith Pel, NP  Vitamin D, Ergocalciferol, (DRISDOL) 1.25 MG (50000 UNIT) CAPS capsule TAKE 1 CAPSULE BY MOUTH WEEKLY 12/07/22   Johney Maine, MD    No current facility-administered medications for this encounter.   Current  Outpatient Medications  Medication Sig Dispense Refill   DODEX 1000 MCG/ML injection INJECT 1 MLS EVERY WEEK FOR 4 WEEKS THEN 1 ML MONTHLY THEREAFTER 6 mL 1   DUPIXENT 300 MG/2ML prefilled syringe Inject 300 mg into the skin every 14 (fourteen) days.     furosemide (LASIX) 40 MG tablet Take 40 mg by mouth as needed.     gabapentin (NEURONTIN) 100 MG capsule Take 200mg  (2 caps) in the morning and 400mg  (4 caps) at bedtime 180 capsule 2   meloxicam (MOBIC) 15 MG tablet Take 15 mg by mouth daily.     methocarbamol (ROBAXIN) 500 MG tablet TAKE 1 TABLET BY MOUTH FOUR TIMES DAILY AS NEEDED FOR MUSCLE SPASM     omeprazole (PRILOSEC) 40 MG capsule TAKE 1 CAPSULE(40 MG) BY MOUTH DAILY 30 capsule 5   Pancrelipase, Lip-Prot-Amyl, (CREON) 24000-76000 units CPEP Take 2 capsules (48,000 Units total) by mouth with breakfast, with lunch, and with evening meal AND 1 capsule (24,000 Units total) with snacks. 180 capsule 3   Vitamin D, Ergocalciferol, (DRISDOL) 1.25 MG (50000 UNIT) CAPS capsule TAKE 1 CAPSULE BY MOUTH WEEKLY 12 capsule 4    Allergies as of 02/28/2023   (No Known Allergies)    Social History   Socioeconomic History   Marital status: Legally Separated    Spouse name: Not on file   Number of children: 3   Years of education: Not on file   Highest education level: Not on file  Occupational History   Occupation: Travel nurse  Tobacco Use   Smoking status: Former    Current packs/day: 0.00    Types:  Cigarettes    Quit date: 08/29/1991    Years since quitting: 31.5   Smokeless tobacco: Never  Vaping Use   Vaping status: Never Used  Substance and Sexual Activity   Alcohol use: Not Currently    Comment: Patient drinks 2 40oz beers multiple days a week   Drug use: No   Sexual activity: Not on file  Other Topics Concern   Not on file  Social History Narrative   Not on file   Social Drivers of Health   Financial Resource Strain: Not on file  Food Insecurity: Not on file  Transportation Needs: Not on file  Physical Activity: Not on file  Stress: Not on file  Social Connections: Unknown (05/11/2021)   [facility]   Social Network    Social Network: Not on file  Intimate Partner Violence: Unknown (04/06/2021)   Received from [facility]   HITS    Physically Hurt: Not on file    Insult or Talk Down To: Not on file    Threaten Physical Harm: Not on file    Scream or Curse: Not on file     Code Status   Code Status: Prior  Review of Systems: All systems reviewed and negative except where noted in HPI.  Physical Exam: Vital signs in last 24 hours: Temp:  [98.7 F (37.1 C)] 98.7 F (37.1 C) (02/27 1213) Pulse Rate:  [66-87] 71 (02/27 1245) Resp:  [12-18] 12 (02/27 1245) BP: (74-99)/(52-67) 99/67 (02/27 1245) SpO2:  [100 %] 100 % (02/27 1245) Weight:  [78 kg] 78 kg (02/27 1230)    General:  Pleasant female in NAD Psych:  Cooperative. Normal mood and affect Eyes: Pupils equal Ears:  Normal auditory acuity Nose: No deformity, discharge or lesions Neck:  Supple, no masses felt Lungs:  Clear to auscultation.  Heart:  Regular rate, regular rhythm.  Abdomen:  Soft, nondistended, nontender, active bowel sounds, no masses felt Rectal :  Deferred Msk: Symmetrical without gross deformities.  Neurologic:  Alert, oriented, grossly normal neurologically Rectal: flecks of blood in vault. No stool in vault. No masses  felt Extremities : No edema Skin:  Intact without significant lesions.    Intake/Output from previous day: No intake/output data recorded. Intake/Output this shift:  Total I/O In: 500 [IV Piggyback:500] Out: -    Willette Cluster, NP-C   02/28/2023, 2:06 PM  GI ATTENDING  History, laboratories, x-rays, prior colonoscopy and upper endoscopy reports all personally reviewed.  Patient personally seen and examined at bedside in ER.  Family member at bedside.  Agree with comprehensive consultation note as outlined above.  Patient presents with moderate volume painless hematochezia associated with lightheadedness.  Now stable.  Felt most likely diverticular bleed.  Does have internal hemorrhoids.  No concern for upper GI bleeding.  She is status post gastric bypass.  Also has myeloma with associated chronic anemia.  Hemoglobin down 2 g from baseline.  Follow-up CBC pending.  CTA completed.  Results pending.  Continue with supportive measures.  If she were to have significant recurrent bleeding (and this CTA negative), repeat CTA, as she may need angiography with embolization.  Her issues are complicated by not accepting blood products due to religious believes.  Will follow.  Wilhemina Bonito. Eda Keys., M.D. [facility] Division of Gastroenterology

## 2023-02-28 NOTE — H&P (Signed)
 History and Physical  SAROYA RICCOBONO ZOX:[id number] DOB: [birth year] DOA: 02/28/2023  PCP: Ollen Bowl, MD   Chief Complaint: BRBPR   HPI: Debra Jacobson is a 63 y.o. female Jehov[employer]'s Witness with medical history significant for hypertension, GERD, osteoarthritis knee admitted to the hospital with lower GI bleed.  Patient states that she has never had a prior GI bleed before, has known diverticulosis and internal hemorrhoids.  She does take meloxicam and ibuprofen for musculoskeletal pain. She had a couple of episodes of bright red blood per rectum last week, this resolved spontaneously, however recurred multiple times in the last 24 hours.  This morning while she was at the doctor's office, as well as later.  In the emergency department, she became very nauseous, diaphoretic, and dizzy.  No vomiting, no abdominal or rectal pain.  Review of Systems: Please see HPI for pertinent positives and negatives. A complete 10 system review of systems are otherwise negative.  Past Medical History:  Diagnosis Date   Allergy    all rhinitis   Dependent edema    Dermatitis    GERD (gastroesophageal reflux disease)    Hypertension    Past Surgical History:  Procedure Laterality Date   CESAREAN SECTION     x2   TUBAL LIGATION     Social History:  reports that she quit smoking about 31 years ago. Her smoking use included cigarettes. She has never used smokeless tobacco. She reports that she does not currently use alcohol. She reports that she does not use drugs.  No Known Allergies  Family History  Problem Relation Age of Onset   Hyperlipidemia Mother    Non-Hodgkin's lymphoma Father    Diabetes Maternal Grandmother    Colon cancer Neg Hx    Stomach cancer Neg Hx    Esophageal cancer Neg Hx    Colon polyps Neg Hx    Pancreatic cancer Neg Hx    Breast cancer Neg Hx      Prior to Admission medications   Medication Sig Start Date End Date Taking? Authorizing Provider  DODEX 1000  MCG/ML injection INJECT 1 MLS EVERY WEEK FOR 4 WEEKS THEN 1 ML MONTHLY THEREAFTER 04/10/22   Johney Maine, MD  DUPIXENT 300 MG/2ML prefilled syringe Inject 300 mg into the skin every 14 (fourteen) days. 04/09/19   [provider]  furosemide (LASIX) 40 MG tablet Take 40 mg by mouth as needed.    [provider]  gabapentin (NEURONTIN) 100 MG capsule Take 200mg  (2 caps) in the morning and 400mg  (4 caps) at bedtime 09/20/22   Johney Maine, MD  meloxicam (MOBIC) 15 MG tablet Take 15 mg by mouth daily.    [provider]  methocarbamol (ROBAXIN) 500 MG tablet TAKE 1 TABLET BY MOUTH FOUR TIMES DAILY AS NEEDED FOR MUSCLE SPASM    [provider]  omeprazole (PRILOSEC) 40 MG capsule TAKE 1 CAPSULE(40 MG) BY MOUTH DAILY 02/07/22   Pyrtle, Carie Caddy, MD  Pancrelipase, Lip-Prot-Amyl, (CREON) 24000-76000 units CPEP Take 2 capsules (48,000 Units total) by mouth with breakfast, with lunch, and with evening meal AND 1 capsule (24,000 Units total) with snacks. 06/21/21   Meredith Pel, NP  Vitamin D, Ergocalciferol, (DRISDOL) 1.25 MG (50000 UNIT) CAPS capsule TAKE 1 CAPSULE BY MOUTH WEEKLY 12/07/22   Johney Maine, MD    Physical Exam: BP 106/63   Pulse 68   Temp 98.7 F (37.1 C) (Oral)   Resp 16   Ht  5\' 7"  (1.702 m)   Wt 78 kg   LMP 04/14/2012   SpO2 100%   BMI 26.93 kg/m  General:  Alert, oriented, calm, in no acute distress Cardiovascular: RRR, no murmurs or rubs, no peripheral edema  Respiratory: clear to auscultation bilaterally, no wheezes, no crackles  Abdomen: soft, nontender, nondistended, normal bowel tones heard  Skin: dry, no rashes  Musculoskeletal: no joint effusions, normal range of motion  Psychiatric: appropriate affect, normal speech  Neurologic: extraocular muscles intact, clear speech, moving all extremities with intact sensorium         Labs on Admission:  Basic Metabolic Panel: Recent Labs  Lab 02/28/23 1225 02/28/23 1232   NA 136 141  K 3.8 3.9  CL 109 110  CO2 19*  --   GLUCOSE 141* 138*  BUN 22 19  CREATININE 1.21* 1.30*  CALCIUM 8.4*  --    Liver Function Tests: Recent Labs  Lab 02/28/23 1225  AST 29  ALT 28  ALKPHOS 123  BILITOT 0.4  PROT 7.2  ALBUMIN 3.1*   No results for input(s): "LIPASE", "AMYLASE" in the last 168 hours. No results for input(s): "AMMONIA" in the last 168 hours. CBC: Recent Labs  Lab 02/28/23 1225 02/28/23 1232  WBC 12.4*  --   NEUTROABS 7.3  --   HGB 8.3* 8.8*  HCT 27.6* 26.0*  MCV 105.7*  --   PLT 213  --    Cardiac Enzymes: No results for input(s): "CKTOTAL", "CKMB", "CKMBINDEX", "TROPONINI" in the last 168 hours. BNP (last 3 results) No results for input(s): "BNP" in the last 8760 hours.  ProBNP (last 3 results) No results for input(s): "PROBNP" in the last 8760 hours.  CBG: No results for input(s): "GLUCAP" in the last 168 hours.  Radiological Exams on Admission: No results found.  Assessment/Plan Debra Jacobson is a 63 y.o. female Jehovah's Witness with medical history significant for hypertension, GERD, osteoarthritis knee admitted to the hospital with lower GI bleed. Etiology unclear could be related to internal hemorrhoids, diverticulosis or other. Significant NSAID use noted.  Lower GI bleed - observation admission - monitor on telemetry - avoid blood thinners - Trend Hb Q6 hours - follow up GI bleeding scan study - discussed with [facility], their input is appreciated - Keep NPO for now per GI - patient confirms she declines blood products due to being Jehovah's Witness  GERD - PO PPI  Chronic pain - hold NSAIDs, continue home gabapentin  AKI - baseline renal function is normal. This is likely ATN from hypotension. Resolved after 500 cc bolus. - avoid hypotension - avoid nephrotoxins - recheck renal function in AM  DVT prophylaxis: SCDs only     Code Status: Full Code  Consults called: [facility]  Admission status: Observation    Time spent: 48 minutes  Armando Lauman Sharlette Dense MD [facility] Pager [phone]  If 7PM-7AM, please contact night-coverage [email or web address] Password [facility]  02/28/2023, 2:38 PM

## 2023-03-01 ENCOUNTER — Other Ambulatory Visit: Payer: Self-pay

## 2023-03-01 DIAGNOSIS — D472 Monoclonal gammopathy: Secondary | ICD-10-CM

## 2023-03-01 DIAGNOSIS — K5731 Diverticulosis of large intestine without perforation or abscess with bleeding: Secondary | ICD-10-CM | POA: Diagnosis not present

## 2023-03-01 DIAGNOSIS — D649 Anemia, unspecified: Secondary | ICD-10-CM

## 2023-03-01 DIAGNOSIS — K922 Gastrointestinal hemorrhage, unspecified: Secondary | ICD-10-CM | POA: Diagnosis not present

## 2023-03-01 DIAGNOSIS — R933 Abnormal findings on diagnostic imaging of other parts of digestive tract: Secondary | ICD-10-CM

## 2023-03-01 DIAGNOSIS — K921 Melena: Secondary | ICD-10-CM | POA: Diagnosis not present

## 2023-03-01 DIAGNOSIS — D62 Acute posthemorrhagic anemia: Secondary | ICD-10-CM | POA: Diagnosis not present

## 2023-03-01 LAB — BASIC METABOLIC PANEL
Anion gap: 6 (ref 5–15)
BUN: 17 mg/dL (ref 8–23)
CO2: 21 mmol/L — ABNORMAL LOW (ref 22–32)
Calcium: 8.4 mg/dL — ABNORMAL LOW (ref 8.9–10.3)
Chloride: 109 mmol/L (ref 98–111)
Creatinine, Ser: 0.88 mg/dL (ref 0.44–1.00)
GFR, Estimated: >60 mL/min (ref >60)
Glucose, Bld: 99 mg/dL (ref 70–99)
Potassium: 4 mmol/L (ref 3.5–5.1)
Sodium: 136 mmol/L (ref 135–145)

## 2023-03-01 LAB — CBC
HCT: 24.6 % — ABNORMAL LOW (ref 36.0–46.0)
Hemoglobin: 7.3 g/dL — ABNORMAL LOW (ref 12.0–15.0)
MCH: 31.3 pg (ref 26.0–34.0)
MCHC: 29.7 g/dL — ABNORMAL LOW (ref 30.0–36.0)
MCV: 105.6 fL — ABNORMAL HIGH (ref 80.0–100.0)
Platelets: 181 10*3/uL (ref 150–400)
RBC: 2.33 MIL/uL — ABNORMAL LOW (ref 3.87–5.11)
RDW: 14.5 % (ref 11.5–15.5)
WBC: 8.6 10*3/uL (ref 4.0–10.5)
nRBC: 0 % (ref 0.0–0.2)

## 2023-03-01 NOTE — Discharge Summary (Signed)
 Physician Discharge Summary  Debra Jacobson ZOX:[id number] DOB: 12-05-1960 DOA: 02/28/2023  PCP: Ollen Bowl, MD  Admit date: 02/28/2023 Discharge date: 03/01/2023  Admitted From: Home Disposition: Home  Recommendations for Outpatient Follow-up:  Follow up with PCP in 1-2 weeks Please obtain BMP/CBC in one week your next doctors visit.  Advised to avoid NSAIDs Needs to follow-up outpatient GI next week for repeat blood work and further workup   Discharge Condition: Stable CODE STATUS: Full Diet recommendation: Regular  Brief/Interim Summary: Brief Narrative:   63 year old with history of HTN, GERD, OA admitted to the hospital for lower GI bleed.  She is known to have prior history of diverticulosis and internal hemorrhoids.  Has been taking NSAIDs for musculoskeletal pain.  During the hospitalization hemoglobin drifted down but rectal bleeding mostly subsided.  CTA was negative for active bleeding.  Patient declined any further inpatient procedures.  She would like to be discharged.  Seen by GI, cleared for discharge for now with outpatient follow-up  Assessment & Plan:  Principal Problem:   Acute lower GI hemorrhage Active Problems:   Hematochezia   Acute blood loss anemia   Diverticulosis of colon with hemorrhage    Hematochezia Hypotension secondary to acute blood loss History of prior cirrhosis, alcohol? -Patient is a Jehovah's Witness therefore does not accept any blood products. - [facility] has been consulted.  Baseline hemoglobin 11.0, admission hemoglobin 8.3 > 7.3 -CTA-possible ascending colon mass, distended gallbladder likely hydrops.  No obvious GI bleeding - Patient has been seen by [facility].  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    GERD  -PPI   Chronic pain due to  osteoarthritis -Avoid NSAIDs.  Continue gabapentin   AKI, resolved - Baseline creatinine 0.8, admission creatinine 1.2, peaked at 1.3.  Now resolved  DVT prophylaxis: SCDs Start: 02/28/23 1437    Code Status: Full Code Family Communication:   Discharge    Subjective: Patient states her bleeding has subsided.  Adamant about being discharged today   Examination:  General exam: Appears calm and comfortable  Respiratory system: Clear to auscultation. Respiratory effort normal. Cardiovascular system: S1 & S2 heard, RRR. No JVD, murmurs, rubs, gallops or clicks. No pedal edema. Gastrointestinal system: Abdomen is nondistended, soft and nontender. No organomegaly or masses felt. Normal bowel sounds heard. Central nervous system: Alert and oriented. No focal neurological deficits. Extremities: Symmetric 5 x 5 power. Skin: No rashes, lesions or ulcers Psychiatry: Judgement and insight appear normal. Mood & affect appropriate.    Discharge Diagnoses:  Principal Problem:   Acute lower GI hemorrhage Active Problems:   Hematochezia   Acute blood loss anemia   Diverticulosis of colon with hemorrhage      Discharge Exam: Vitals:   03/01/23 0226 03/01/23 0629  BP: 110/73 107/67  Pulse: 76 76  Resp: 20 14  Temp: 98.4 F (36.9 C) 98.7 F (37.1 C)  SpO2: 97% 100%   Vitals:   02/28/23 1848 02/28/23 2253 03/01/23 0226 03/01/23 0629  BP: 102/76 108/71 110/73 107/67  Pulse: 77 79 76 76  Resp: 18 16 20 14   Temp: 99.3 F (37.4 C) 98.4 F (36.9 C) 98.4 F (36.9 C) 98.7 F (37.1 C)  TempSrc: Oral Oral Oral Oral  SpO2: 98% 99% 97% 100%  Weight:      Height:  Discharge Instructions   Allergies as of 03/01/2023   No Known Allergies      Medication List     STOP taking these medications    meloxicam 15 MG tablet Commonly known as: MOBIC       TAKE these medications    Airsupra 90-80 MCG/ACT Aero Generic drug: Albuterol-Budesonide Inhale 2 puffs  into the lungs every 4 (four) hours as needed (SOB/Wheezing).   furosemide 40 MG tablet Commonly known as: LASIX Take 40 mg by mouth as needed for edema.   gabapentin 100 MG capsule Commonly known as: Neurontin Take 200mg  (2 caps) in the morning and 400mg  (4 caps) at bedtime What changed:  how much to take how to take this when to take this additional instructions   methocarbamol 500 MG tablet Commonly known as: ROBAXIN Take 500 mg by mouth 3 (three) times daily as needed for muscle spasms.   omeprazole 40 MG capsule Commonly known as: PRILOSEC TAKE 1 CAPSULE(40 MG) BY MOUTH DAILY What changed: See the new instructions.        No Known Allergies  You were cared for by a hospitalist during your hospital stay. If you have any questions about your discharge medications or the care you received while you were in the hospital after you are discharged, you can call the unit and asked to speak with the hospitalist on call if the hospitalist that took care of you is not available. Once you are discharged, your primary care physician will handle any further medical issues. Please note that no refills for any discharge medications will be authorized once you are discharged, as it is imperative that you return to your primary care physician (or establish a relationship with a primary care physician if you do not have one) for your aftercare needs so that they can reassess your need for medications and monitor your lab values.  You were cared for by a hospitalist during your hospital stay. If you have any questions about your discharge medications or the care you received while you were in the hospital after you are discharged, you can call the unit and asked to speak with the hospitalist on call if the hospitalist that took care of you is not available. Once you are discharged, your primary care physician will handle any further medical issues. Please note that NO REFILLS for any discharge  medications will be authorized once you are discharged, as it is imperative that you return to your primary care physician (or establish a relationship with a primary care physician if you do not have one) for your aftercare needs so that they can reassess your need for medications and monitor your lab values.  Please request your Prim.MD to go over all Hospital Tests and Procedure/Radiological results at the follow up, please get all Hospital records sent to your Prim MD by signing hospital release before you go home.  Get CBC, CMP, 2 view Chest X ray checked  by Primary MD during your next visit or SNF MD in 5-7 days ( we routinely change or add medications that can affect your baseline labs and fluid status, therefore we recommend that you get the mentioned basic workup next visit with your PCP, your PCP may decide not to get them or add new tests based on their clinical decision)  On your next visit with your primary care physician please Get Medicines reviewed and adjusted.  If you experience worsening of your admission symptoms, develop shortness of breath, life threatening  emergency, suicidal or homicidal thoughts you must seek medical attention immediately by calling 911 or calling your MD immediately  if symptoms less severe.  You Must read complete instructions/literature along with all the possible adverse reactions/side effects for all the Medicines you take and that have been prescribed to you. Take any new Medicines after you have completely understood and accpet all the possible adverse reactions/side effects.   Do not drive, operate heavy machinery, perform activities at heights, swimming or participation in water activities or provide baby sitting services if your were admitted for syncope or siezures until you have seen by Primary MD or a Neurologist and advised to do so again.  Do not drive when taking Pain medications.   Procedures/Studies: CT ANGIO GI BLEED Result Date:  02/28/2023 CLINICAL DATA:  rectal bleeding. Two episodes of bright red blood from rectum. * Tracking Code: BO * EXAM: CTA ABDOMEN AND PELVIS WITHOUT AND WITH CONTRAST TECHNIQUE: Multidetector CT imaging of the abdomen and pelvis was performed using the standard protocol during bolus administration of intravenous contrast. Multiplanar reconstructed images and MIPs were obtained and reviewed to evaluate the vascular anatomy. RADIATION DOSE REDUCTION: This exam was performed according to the departmental dose-optimization program which includes automated exposure control, adjustment of the mA and/or kV according to patient size and/or use of iterative reconstruction technique. CONTRAST:  OMNIPAQUE IOHEXOL 350 MG/ML SOLN COMPARISON:  CT scan abdomen and pelvis from 05/20/2019. FINDINGS: VASCULAR Aorta: Normal caliber aorta without aneurysm, dissection, vasculitis or significant stenosis. Celiac: Patent without evidence of aneurysm, dissection, vasculitis or significant stenosis. SMA: Patent without evidence of aneurysm, dissection, vasculitis or significant stenosis. Renals: Both renal arteries are patent without evidence of aneurysm, dissection, vasculitis, fibromuscular dysplasia or significant stenosis. IMA: Patent without evidence of aneurysm, dissection, vasculitis or significant stenosis. Inflow: Patent without evidence of aneurysm, dissection, vasculitis or significant stenosis. Proximal Outflow: Bilateral common femoral and visualized portions of the superficial and profunda femoral arteries are patent without evidence of aneurysm, dissection, vasculitis or significant stenosis. Veins: No obvious venous abnormality within the limitations of this arterial phase study. Review of the MIP images confirms the above findings. NON-VASCULAR Lower chest: There are irregular, linear opacities in the visualized bilateral lungs with patchy areas of bronchiectasis. Findings may represent fibrosis/scarring and/or  atelectasis. Correlate clinically. No pleural effusion. The heart is normal in size. No pericardial effusion. Hepatobiliary: The liver is normal in size. Non-cirrhotic configuration. No suspicious mass. No intrahepatic or extrahepatic bile duct dilation. There is markedly distended gallbladder without abnormal wall thickening or pericholecystic inflammatory changes. No calcified gallstones. Findings favor gallbladder hydrops. Correlate clinically. Pancreas: Unremarkable. No pancreatic ductal dilatation or surrounding inflammatory changes. Spleen: Within normal limits. No focal lesion. Adrenals/Urinary Tract: Adrenal glands are unremarkable. No suspicious renal mass. No hydronephrosis. No renal or ureteric calculi. Unremarkable urinary bladder. Stomach/Bowel: No disproportionate dilation of the small or large bowel loops. There is irregular moderate enhancing soft tissue attenuation area in the proximal ascending colon which is concerning for neoplastic process. Further evaluation with colonoscopy and tissue sampling is recommended. 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 There is no active extravasation of contrast to suggest active GI bleeding. Vascular/Lymphatic: No ascites or pneumoperitoneum. No abdominal or pelvic lymphadenopathy, by size criteria. No aneurysmal dilation of the major abdominal arteries. There are mild peripheral atherosclerotic vascular calcifications of the aorta and its major branches. Reproductive: Bulky lobulated uterus noted, likely secondary to underlying leiomyomas.  Please note reported organs are not well evaluated on the CT scan exam. No large adnexal mass seen. Other: The visualized soft tissues and abdominal wall are unremarkable. Musculoskeletal: No suspicious osseous lesions. There are mild - moderate multilevel degenerative changes in the visualized spine, with asymmetric moderate-to-severe involvement of  L4-5 intervertebral disc. IMPRESSION: 1. Irregular moderate enhancing soft tissue in the proximal ascending colon is concerning for neoplastic process. Further evaluation with colonoscopy and tissue sampling is recommended. 2. No active extravasation of contrast noted to suggest active GI bleeding. 3. Markedly distended gallbladder without abnormal wall thickening or pericholecystic inflammatory changes. Findings favor gallbladder hydrops. 4. No metastatic disease identified within the abdomen or pelvis. 5. Multiple other nonacute observations, as described above. 6. Aortic atherosclerosis. Aortic Atherosclerosis (ICD10-I70.0). Electronically Signed   By: Jules Schick M.D.   On: 02/28/2023 16:32     The results of significant diagnostics from this hospitalization (including imaging, microbiology, ancillary and laboratory) are listed below for reference.     Microbiology: No results found for this or any previous visit (from the past 240 hours).   Labs: BNP (last 3 results) No results for input(s): "BNP" in the last 8760 hours. Basic Metabolic Panel: Recent Labs  Lab 02/28/23 1225 02/28/23 1232 03/01/23 0442  NA 136 141 136  K 3.8 3.9 4.0  CL 109 110 109  CO2 19*  --  21*  GLUCOSE 141* 138* 99  BUN 22 19 17   CREATININE 1.21* 1.30* 0.88  CALCIUM 8.4*  --  8.4*   Liver Function Tests: Recent Labs  Lab 02/28/23 1225  AST 29  ALT 28  ALKPHOS 123  BILITOT 0.4  PROT 7.2  ALBUMIN 3.1*   No results for input(s): "LIPASE", "AMYLASE" in the last 168 hours. No results for input(s): "AMMONIA" in the last 168 hours. CBC: Recent Labs  Lab 02/28/23 1225 02/28/23 1232 02/28/23 1500 02/28/23 2218 03/01/23 0442  WBC 12.4*  --   --   --  8.6  NEUTROABS 7.3  --   --   --   --   HGB 8.3* 8.8* 8.0* 7.1* 7.3*  HCT 27.6* 26.0* 25.7* 23.5* 24.6*  MCV 105.7*  --   --   --  105.6*  PLT 213  --   --   --  181   Cardiac Enzymes: No results for input(s): "CKTOTAL", "CKMB", "CKMBINDEX",  "TROPONINI" in the last 168 hours. BNP: Invalid input(s): "POCBNP" CBG: No results for input(s): "GLUCAP" in the last 168 hours. D-Dimer No results for input(s): "DDIMER" in the last 72 hours. Hgb A1c No results for input(s): "HGBA1C" in the last 72 hours. Lipid Profile No results for input(s): "CHOL", "HDL", "LDLCALC", "TRIG", "CHOLHDL", "LDLDIRECT" in the last 72 hours. Thyroid function studies No results for input(s): "TSH", "T4TOTAL", "T3FREE", "THYROIDAB" in the last 72 hours.  Invalid input(s): "FREET3" Anemia work up No results for input(s): "VITAMINB12", "FOLATE", "FERRITIN", "TIBC", "IRON", "RETICCTPCT" in the last 72 hours. Urinalysis    Component Value Date/Time   BILIRUBINUR neg 03/01/2011 1108   PROTEINUR neg 03/01/2011 1108   UROBILINOGEN negative 03/01/2011 1108   NITRITE neg 03/01/2011 1108   LEUKOCYTESUR Negative 03/01/2011 1108   Sepsis Labs Recent Labs  Lab 02/28/23 1225 03/01/23 0442  WBC 12.4* 8.6   Microbiology No results found for this or any previous visit (from the past 240 hours).   Time coordinating discharge:  I have spent 35 minutes face to face with the patient and on the ward discussing  the patients care, assessment, plan and disposition with other care givers. >50% of the time was devoted counseling the patient about the risks and benefits of treatment/Discharge disposition and coordinating care.   SIGNED:   Miguel Rota, MD  [facility] 03/01/2023, 11:58 AM   If 7PM-7AM, please contact night-coverage

## 2023-03-01 NOTE — Progress Notes (Addendum)
 Pt is insistent on DC today. Pt stated she is not willing to stay overnight, d/t needing to work tomorrow. Dr Nelson Chimes aware.

## 2023-03-01 NOTE — Hospital Course (Addendum)
 Brief Narrative:   63 year old with history of HTN, GERD, OA admitted to the hospital for lower GI bleed.  She is known to have prior history of diverticulosis and internal hemorrhoids.  Has been taking NSAIDs for musculoskeletal pain.  During the hospitalization hemoglobin drifted down but rectal bleeding mostly subsided.  CTA was negative for active bleeding.  Patient declined any further inpatient procedures.  She would like to be discharged.  Seen by GI, cleared for discharge for now with outpatient follow-up  Assessment & Plan:  Principal Problem:   Acute lower GI hemorrhage Active Problems:   Hematochezia   Acute blood loss anemia   Diverticulosis of colon with hemorrhage    Hematochezia Hypotension secondary to acute blood loss History of prior cirrhosis, alcohol? -Patient is a Jehovah's Witness therefore does not accept any blood products. - [facility] has been consulted.  Baseline hemoglobin 11.0, admission hemoglobin 8.3 > 7.3 -CTA-possible ascending colon mass, distended gallbladder likely hydrops.  No obvious GI bleeding - Patient has been seen by [facility].  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    GERD  -PPI   Chronic pain due to osteoarthritis -Avoid NSAIDs.  Continue gabapentin   AKI, resolved - Baseline creatinine 0.8, admission creatinine 1.2, peaked at 1.3.  Now resolved  DVT prophylaxis: SCDs Start: 02/28/23 1437    Code Status: Full Code Family Communication:   Discharge    Subjective: Patient states her bleeding has subsided.  Adamant about being discharged today   Examination:  General exam: Appears calm and comfortable  Respiratory system: Clear to auscultation. Respiratory effort normal. Cardiovascular system: S1 & S2 heard, RRR. No JVD, murmurs, rubs, gallops or clicks. No  pedal edema. Gastrointestinal system: Abdomen is nondistended, soft and nontender. No organomegaly or masses felt. Normal bowel sounds heard. Central nervous system: Alert and oriented. No focal neurological deficits. Extremities: Symmetric 5 x 5 power. Skin: No rashes, lesions or ulcers Psychiatry: Judgement and insight appear normal. Mood & affect appropriate.

## 2023-03-01 NOTE — TOC Transition Note (Signed)
 Transition of Care [facility]) - Discharge Note   Patient Details  Name: Debra Jacobson MRN: [id number] Date of Birth: [birth year]  Transition of Care [facility]) CM/SW Contact:  Lanier Clam, RN Phone Number: 03/01/2023, 12:03 PM   Clinical Narrative:d/c plan home.       Final next level of care: Home/Self Care Barriers to Discharge: No Barriers Identified   Patient Goals and CMS Choice Patient states their goals for this hospitalization and ongoing recovery are:: Home CMS Medicare.gov Compare Post Acute Care list provided to:: Patient Choice offered to / list presented to : Patient [place] ownership interest in [facility].provided to:: Patient    Discharge Placement                       Discharge Plan and Services Additional resources added to the After Visit Summary for                                       Social Drivers of Health (SDOH) Interventions SDOH Screenings   Food Insecurity: No Food Insecurity (03/01/2023)  Housing: Low Risk  (03/01/2023)  Transportation Needs: Unknown (03/01/2023)  Utilities: Not At Risk (03/01/2023)  Social Connections: Unknown (05/11/2021)   Received from [facility], [facility]  Tobacco Use: Medium Risk (02/28/2023)     Readmission Risk Interventions     No data to display

## 2023-03-01 NOTE — Progress Notes (Addendum)
 Daily Progress Note  DOA: 02/28/2023 Hospital Day: 2   Chief Complaint: lower GI bleed  ASSESSMENT    Brief Narrative:  Debra Jacobson is a 63 y.o. 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 Diverticular hemorrhage suspected. More distant possibility is brisk upper GI bleed in setting of large amounts of NSAIDs but BUN has been normal.  TODAY: CT angio last evening negative for active GI bleeding. She had two small BMs this am which contained maroon colored blood ( ? Old blood) but also a couple of stools this am which were normal color and without blood. She really wants to be discharged to return to work.     Acute on chronic macrocytic anemia related to GI bleed Baseline hemoglobin 10.8, presented to ED at 8.3.  TODAY:  Hgb declined with IVF to 7.1 last night but stable overnight being 7.3 today. Feels okay. Jehovah's witness so no blood products.   Abnormal colon on CT scan. Iirregular moderate enhancing soft tissue in the proximal ascending colon is concerning for neoplastic process.  No ascending colon lesions on colonoscopy May 2023 and the . prep was good to fair ( except in cecum).    Cirrhosis based on electrography July 2023. Compensated. Probably Etoh related with negative prior workup.  No varices on screening EGD Aug 2023. No evidence for decompensation though does have a history of mild ascites on [place] in 2023. Normal platelet count and INR. Abstinent from Etoh for > 1 year  History of remote gastric bypass  GERD / history of LA Grade C reflux esophagitis   Jehovah's Witness   Principal Problem:   Acute lower GI hemorrhage Active Problems:   Hematochezia   Acute blood loss anemia   Diverticulosis of colon with hemorrhage   PLAN    --Continue daily Pantoprazole 40 mg --Needs to avoid NSAIDS for right now ( until seen in office for follow up). She can take up to 2 grams of Tylenol daily as needed  --Office follow up on 4/4 at 3pm with Quentin Mulling, PA. --She will need Colonoscopy for evaluation of CT scan findings. Consider doing EGD at same time for 2 year varices screening and also check for mucosal injury from high dose NSAIDS and Mobic --Will have her come by office mid next week to make sure hgb stable. Will also add in iron studies / B12 levels at that time  She agrees to get labs done  Subjective   Feels okay, wants to go home. She had two small BMs this am which contained maroon colored blood ( ? Old blood) but also a couple of stools this am which were normal color and without blood.   Objective    Recent Labs    02/28/23 1225 02/28/23 1232 02/28/23 1500 02/28/23 2218 03/01/23 0442  WBC 12.4*  --   --   --  8.6  HGB 8.3*   < > 8.0* 7.1* 7.3*  HCT 27.6*   < > 25.7* 23.5* 24.6*  PLT 213  --   --   --  181   < > = values in this interval not displayed.   BMET Recent Labs    02/28/23 1225 02/28/23 1232 03/01/23 0442  NA 136 141 136  K 3.8 3.9 4.0  CL 109  110 109  CO2 19*  --  21*  GLUCOSE 141* 138* 99  BUN 22 19 17   CREATININE 1.21* 1.30* 0.88  CALCIUM 8.4*  --  8.4*   LFT Recent Labs    02/28/23 1225  PROT 7.2  ALBUMIN 3.1*  AST 29  ALT 28  ALKPHOS 123  BILITOT 0.4   PT/INR Recent Labs    02/28/23 1225  LABPROT 13.4  INR 1.0     Imaging:  CT ANGIO GI BLEED CLINICAL DATA:  rectal bleeding. Two episodes of bright red blood from rectum. * Tracking Code: BO *  EXAM: CTA ABDOMEN AND PELVIS WITHOUT AND WITH CONTRAST  TECHNIQUE: Multidetector CT imaging of the abdomen and pelvis was performed using the standard protocol during bolus administration of intravenous contrast. Multiplanar reconstructed images and MIPs were obtained and reviewed to evaluate the vascular  anatomy.  RADIATION DOSE REDUCTION: This exam was performed according to the departmental dose-optimization program which includes automated exposure control, adjustment of the mA and/or kV according to patient size and/or use of iterative reconstruction technique.  CONTRAST:  OMNIPAQUE IOHEXOL 350 MG/ML SOLN  COMPARISON:  CT scan abdomen and pelvis from 05/20/2019.  FINDINGS: VASCULAR  Aorta: Normal caliber aorta without aneurysm, dissection, vasculitis or significant stenosis.  Celiac: Patent without evidence of aneurysm, dissection, vasculitis or significant stenosis.  SMA: Patent without evidence of aneurysm, dissection, vasculitis or significant stenosis.  Renals: Both renal arteries are patent without evidence of aneurysm, dissection, vasculitis, fibromuscular dysplasia or significant stenosis.  IMA: Patent without evidence of aneurysm, dissection, vasculitis or significant stenosis.  Inflow: Patent without evidence of aneurysm, dissection, vasculitis or significant stenosis.  Proximal Outflow: Bilateral common femoral and visualized portions of the superficial and profunda femoral arteries are patent without evidence of aneurysm, dissection, vasculitis or significant stenosis.  Veins: No obvious venous abnormality within the limitations of this arterial phase study.  Review of the MIP images confirms the above findings.  NON-VASCULAR  Lower chest: There are irregular, linear opacities in the visualized bilateral lungs with patchy areas of bronchiectasis. Findings may represent fibrosis/scarring and/or atelectasis. Correlate clinically. No pleural effusion. The heart is normal in size. No pericardial effusion.  Hepatobiliary: The liver is normal in size. Non-cirrhotic configuration. No suspicious mass. No intrahepatic or extrahepatic bile duct dilation. There is markedly distended gallbladder without abnormal wall thickening or pericholecystic  inflammatory changes. No calcified gallstones. Findings favor gallbladder hydrops. Correlate clinically.  Pancreas: Unremarkable. No pancreatic ductal dilatation or surrounding inflammatory changes.  Spleen: Within normal limits. No focal lesion.  Adrenals/Urinary Tract: Adrenal glands are unremarkable. No suspicious renal mass. No hydronephrosis. No renal or ureteric calculi. Unremarkable urinary bladder.  Stomach/Bowel: No disproportionate dilation of the small or large bowel loops. There is irregular moderate enhancing soft tissue attenuation area in the proximal ascending colon which is concerning for neoplastic process. Further evaluation with colonoscopy and tissue sampling is recommended. 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 There is no active extravasation of contrast to suggest active GI bleeding.  Vascular/Lymphatic: No ascites or pneumoperitoneum. No abdominal or pelvic lymphadenopathy, by size criteria. No aneurysmal dilation of the major abdominal arteries. There are mild peripheral atherosclerotic vascular calcifications of the aorta and its major branches.  Reproductive: Bulky lobulated uterus noted, likely secondary to underlying leiomyomas. Please note reported organs are not well evaluated on the CT scan exam. No large adnexal mass seen.  Other: The visualized soft tissues  and abdominal wall are unremarkable.  Musculoskeletal: No suspicious osseous lesions. There are mild - moderate multilevel degenerative changes in the visualized spine, with asymmetric moderate-to-severe involvement of L4-5 intervertebral disc.  IMPRESSION: 1. Irregular moderate enhancing soft tissue in the proximal ascending colon is concerning for neoplastic process. Further evaluation with colonoscopy and tissue sampling is recommended. 2. No active extravasation of contrast noted to suggest active  GI bleeding. 3. Markedly distended gallbladder without abnormal wall thickening or pericholecystic inflammatory changes. Findings favor gallbladder hydrops. 4. No metastatic disease identified within the abdomen or pelvis. 5. Multiple other nonacute observations, as described above. 6. Aortic atherosclerosis.  Aortic Atherosclerosis (ICD10-I70.0).  Electronically Signed   By: Jules Schick M.D.   On: 02/28/2023 16:32     Scheduled inpatient medications:   gabapentin  200 mg Oral Daily   gabapentin  400 mg Oral QHS   pantoprazole (PROTONIX) IV  40 mg Intravenous Q12H   Continuous inpatient infusions:   cefTRIAXone (ROCEPHIN)  IV Stopped (02/28/23 1559)   PRN inpatient medications: acetaminophen **OR** acetaminophen, albuterol, ondansetron **OR** ondansetron (ZOFRAN) IV, traZODone  Vital signs in last 24 hours: Temp:  [98.4 F (36.9 C)-99.3 F (37.4 C)] 98.7 F (37.1 C) (02/28 0629) Pulse Rate:  [66-87] 76 (02/28 0629) Resp:  [12-20] 14 (02/28 0629) BP: (74-110)/(52-76) 107/67 (02/28 0629) SpO2:  [97 %-100 %] 100 % (02/28 0629) Weight:  [78 kg] 78 kg (02/27 1230)    Intake/Output Summary (Last 24 hours) at 03/01/2023 0916 Last data filed at 02/28/2023 1559 Gross per 24 hour  Intake 600 ml  Output --  Net 600 ml    Intake/Output from previous day: 02/27 0701 - 02/28 0700 In: 600 [IV Piggyback:600] Out: -  Intake/Output this shift: No intake/output data recorded.   Physical Exam:  General: Alert female in NAD Heart:  Regular rate and rhythm.  Pulmonary: Normal respiratory effort Abdomen: Soft, nondistended, nontender. Normal bowel sounds. Extremities: No lower extremity edema  Neurologic: Alert and oriented Psych: Pleasant. Cooperative. Insight appears normal.      LOS: 0 days   Willette Cluster ,NP 03/01/2023, 9:16 AM  GI ATTENDING  Interval history and data reviewed.  Case discussed with GI nurse practitioner.  Agree with interval progress note.   Bleeding has stopped.  CTA negative for bleeding.  Hemoglobin stable.  Felt to have had a self-limited diverticular bleed.  She is firm about wanting to go home.  My preference is for inpatient colonoscopy to clarify right colon abnormality on CT.  She understands but elects for close outpatient follow-up, which is being arranged as outlined above.  Contact the office in the interim if needed.  Will sign off.  Wilhemina Bonito. Eda Keys., M.D. [facility] Division of Gastroenterology

## 2023-03-01 NOTE — TOC Initial Note (Signed)
 Transition of Care [facility]) - Initial/Assessment Note    Patient Details  Name: Debra Jacobson MRN: [id number] Date of Birth: [birth year]  Transition of Care [facility]) CM/SW Contact:    Lanier Clam, RN Phone Number: 03/01/2023, 9:52 AM  Clinical Narrative:d/c plan home.                   Expected Discharge Plan: Home/Self Care Barriers to Discharge: Continued Medical Work up   Patient Goals and CMS Choice Patient states their goals for this hospitalization and ongoing recovery are:: Home CMS Medicare.gov Compare Post Acute Care list provided to:: Patient Choice offered to / list presented to : Patient [place] ownership interest in [facility].provided to:: Patient    Expected Discharge Plan and Services                                              Prior Living Arrangements/Services                       Activities of Daily Living      Permission Sought/Granted                  Emotional Assessment              Admission diagnosis:  Acute lower GI hemorrhage [K92.2] Lower GI bleed [K92.2] Patient Active Problem List   Diagnosis Date Noted   Acute lower GI hemorrhage 02/28/2023   Hematochezia 02/28/2023   Acute blood loss anemia 02/28/2023   Diverticulosis of colon with hemorrhage 02/28/2023   Chronic pain 02/09/2022   Pain in left knee 01/30/2022   Acute alcoholic pancreatitis 05/20/2019   Alcohol dependence (HCC) 05/20/2019   Iron deficiency anemia 09/10/2018   Abnormal cervical Papanicolaou smear 08/15/2018   Anemia 08/11/2018   Chronic hepatitis C (HCC) 07/27/2013   Elevated liver function tests 07/27/2013   Overweight (BMI 25.0-29.9) 07/27/2013   Eczema 12/09/2011   Status post gastric bypass for obesity 05/30/2011   Status post bariatric surgery 05/30/2011   PCP:  Ollen Bowl, MD Pharmacy:   [facility] [phone] - [place], [place] - [address] AT [address] [place]  [address] Phone: [phone] Fax: [phone]     Social Drivers of Health (SDOH) Social History: SDOH Screenings   Social Connections: Unknown (05/11/2021)   Received from [facility]  Tobacco Use: Medium Risk (02/28/2023)   SDOH Interventions:     Readmission Risk Interventions     No data to display

## 2023-03-04 ENCOUNTER — Inpatient Hospital Stay: Payer: No Typology Code available for payment source | Attending: Hematology

## 2023-03-04 DIAGNOSIS — E538 Deficiency of other specified B group vitamins: Secondary | ICD-10-CM | POA: Diagnosis present

## 2023-03-04 DIAGNOSIS — Z87891 Personal history of nicotine dependence: Secondary | ICD-10-CM | POA: Diagnosis not present

## 2023-03-04 DIAGNOSIS — D649 Anemia, unspecified: Secondary | ICD-10-CM | POA: Diagnosis present

## 2023-03-04 DIAGNOSIS — D472 Monoclonal gammopathy: Secondary | ICD-10-CM | POA: Diagnosis present

## 2023-03-04 DIAGNOSIS — Z9884 Bariatric surgery status: Secondary | ICD-10-CM | POA: Diagnosis not present

## 2023-03-04 LAB — CMP (CANCER CENTER ONLY)
ALT: 28 U/L (ref 0–44)
AST: 40 U/L (ref 15–41)
Albumin: 3.7 g/dL (ref 3.5–5.0)
Alkaline Phosphatase: 135 U/L — ABNORMAL HIGH (ref 38–126)
Anion gap: 4 — ABNORMAL LOW (ref 5–15)
BUN: 18 mg/dL (ref 8–23)
CO2: 25 mmol/L (ref 22–32)
Calcium: 8.9 mg/dL (ref 8.9–10.3)
Chloride: 107 mmol/L (ref 98–111)
Creatinine: 0.9 mg/dL (ref 0.44–1.00)
GFR, Estimated: >60 mL/min (ref >60)
Glucose, Bld: 93 mg/dL (ref 70–99)
Potassium: 3.8 mmol/L (ref 3.5–5.1)
Sodium: 136 mmol/L (ref 135–145)
Total Bilirubin: 0.3 mg/dL (ref 0.0–1.2)
Total Protein: 7.4 g/dL (ref 6.5–8.1)

## 2023-03-04 LAB — CBC WITH DIFFERENTIAL (CANCER CENTER ONLY)
Abs Immature Granulocytes: 0.02 10*3/uL (ref 0.00–0.07)
Basophils Absolute: 0 10*3/uL (ref 0.0–0.1)
Basophils Relative: 0 %
Eosinophils Absolute: 0.1 10*3/uL (ref 0.0–0.5)
Eosinophils Relative: 1 %
HCT: 23 % — ABNORMAL LOW (ref 36.0–46.0)
Hemoglobin: 7.2 g/dL — ABNORMAL LOW (ref 12.0–15.0)
Immature Granulocytes: 0 %
Lymphocytes Relative: 28 %
Lymphs Abs: 2.4 10*3/uL (ref 0.7–4.0)
MCH: 31.6 pg (ref 26.0–34.0)
MCHC: 31.3 g/dL (ref 30.0–36.0)
MCV: 100.9 fL — ABNORMAL HIGH (ref 80.0–100.0)
Monocytes Absolute: 0.5 10*3/uL (ref 0.1–1.0)
Monocytes Relative: 6 %
Neutro Abs: 5.4 10*3/uL (ref 1.7–7.7)
Neutrophils Relative %: 65 %
Platelet Count: 196 10*3/uL (ref 150–400)
RBC: 2.28 MIL/uL — ABNORMAL LOW (ref 3.87–5.11)
RDW: 15.1 % (ref 11.5–15.5)
WBC Count: 8.4 10*3/uL (ref 4.0–10.5)
nRBC: 0 % (ref 0.0–0.2)

## 2023-03-04 LAB — IRON AND IRON BINDING CAPACITY (CC-WL,HP ONLY)
Iron: 33 ug/dL (ref 28–170)
Saturation Ratios: 9 % — ABNORMAL LOW (ref 10.4–31.8)
TIBC: 370 ug/dL (ref 250–450)
UIBC: 337 ug/dL (ref 148–442)

## 2023-03-04 LAB — RETICULOCYTES
Immature Retic Fract: 31.3 % — ABNORMAL HIGH (ref 2.3–15.9)
RBC.: 2.28 MIL/uL — ABNORMAL LOW (ref 3.87–5.11)
Retic Count, Absolute: 99.4 10*3/uL (ref 19.0–186.0)
Retic Ct Pct: 4.4 % — ABNORMAL HIGH (ref 0.4–3.1)

## 2023-03-04 LAB — VITAMIN B12: Vitamin B-12: 3226 pg/mL — ABNORMAL HIGH (ref 180–914)

## 2023-03-04 LAB — FERRITIN: Ferritin: 23 ng/mL (ref 11–307)

## 2023-03-05 ENCOUNTER — Telehealth: Payer: Self-pay | Admitting: *Deleted

## 2023-03-05 ENCOUNTER — Encounter: Payer: Self-pay | Admitting: Hematology

## 2023-03-05 DIAGNOSIS — K7460 Unspecified cirrhosis of liver: Secondary | ICD-10-CM

## 2023-03-05 LAB — KAPPA/LAMBDA LIGHT CHAINS
Kappa free light chain: 61.4 mg/L — ABNORMAL HIGH (ref 3.3–19.4)
Kappa, lambda light chain ratio: 3.34 — ABNORMAL HIGH (ref 0.26–1.65)
Lambda free light chains: 18.4 mg/L (ref 5.7–26.3)

## 2023-03-05 NOTE — Telephone Encounter (Signed)
-----   Message from Willette Cluster sent at 03/01/2023 10:27 AM EST ----- Good morning,   Bonita Quin can you please place order for CBC, ferritin, TIBC, B12 and folate under Dr. Lauro Franklin name for this patient to be done next Wed 3/5?   Vonna Kotyk, please see my hospital notes on her. Incidental findings of ascending colon on CT angio but had colonoscopy about 2 years ago with you. Needs another colonoscopy but also  due for EGD for varices screening . Offered colonoscopy inpatient but she wanted to go home.    No hospital follow up appts available until April which is not ideal at all.   I got her in with Marchelle Folks early April  Thanks, pg

## 2023-03-05 NOTE — Telephone Encounter (Signed)
 Orders placed for 03/06/23.

## 2023-03-06 ENCOUNTER — Ambulatory Visit: Payer: Commercial Managed Care - HMO | Admitting: Dermatology

## 2023-03-06 ENCOUNTER — Telehealth: Payer: Self-pay

## 2023-03-06 ENCOUNTER — Other Ambulatory Visit (INDEPENDENT_AMBULATORY_CARE_PROVIDER_SITE_OTHER)

## 2023-03-06 DIAGNOSIS — K7460 Unspecified cirrhosis of liver: Secondary | ICD-10-CM

## 2023-03-06 LAB — IBC + FERRITIN
Ferritin: 14.6 ng/mL (ref 10.0–291.0)
Iron: 36 ug/dL — ABNORMAL LOW (ref 42–145)
Saturation Ratios: 9.4 % — ABNORMAL LOW (ref 20.0–50.0)
TIBC: 382.2 ug/dL (ref 250.0–450.0)
Transferrin: 273 mg/dL (ref 212.0–360.0)

## 2023-03-06 LAB — CBC WITH DIFFERENTIAL/PLATELET
Basophils Absolute: 0 10*3/uL (ref 0.0–0.1)
Basophils Relative: 0.5 % (ref 0.0–3.0)
Eosinophils Absolute: 0.1 10*3/uL (ref 0.0–0.7)
Eosinophils Relative: 1.3 % (ref 0.0–5.0)
HCT: 24.2 % — ABNORMAL LOW (ref 36.0–46.0)
Hemoglobin: 7.9 g/dL — CL (ref 12.0–15.0)
Lymphocytes Relative: 29.1 % (ref 12.0–46.0)
Lymphs Abs: 2.2 10*3/uL (ref 0.7–4.0)
MCHC: 32.7 g/dL (ref 30.0–36.0)
MCV: 100.6 fl — ABNORMAL HIGH (ref 78.0–100.0)
Monocytes Absolute: 0.6 10*3/uL (ref 0.1–1.0)
Monocytes Relative: 7.3 % (ref 3.0–12.0)
Neutro Abs: 4.7 10*3/uL (ref 1.4–7.7)
Neutrophils Relative %: 61.8 % (ref 43.0–77.0)
Platelets: 224 10*3/uL (ref 150.0–400.0)
RBC: 2.41 Mil/uL — ABNORMAL LOW (ref 3.87–5.11)
RDW: 15.8 % — ABNORMAL HIGH (ref 11.5–15.5)
WBC: 7.5 10*3/uL (ref 4.0–10.5)

## 2023-03-06 LAB — B12 AND FOLATE PANEL
Folate: 7.7 ng/mL (ref >5.9)
Vitamin B-12: >1537 pg/mL — ABNORMAL HIGH (ref 211–911)

## 2023-03-06 NOTE — Telephone Encounter (Signed)
 Received a call from Clydie Braun in the lab of a critical Hgb of 7.9. It will be resulted in your in-box soon.

## 2023-03-07 ENCOUNTER — Encounter: Payer: Self-pay | Admitting: Orthopedic Surgery

## 2023-03-07 ENCOUNTER — Ambulatory Visit: Admitting: Orthopedic Surgery

## 2023-03-07 DIAGNOSIS — M25511 Pain in right shoulder: Secondary | ICD-10-CM

## 2023-03-07 DIAGNOSIS — G8929 Other chronic pain: Secondary | ICD-10-CM | POA: Diagnosis not present

## 2023-03-07 LAB — MULTIPLE MYELOMA PANEL, SERUM
Albumin SerPl Elph-Mcnc: 3.4 g/dL (ref 2.9–4.4)
Albumin/Glob SerPl: 1 (ref 0.7–1.7)
Alpha 1: 0.3 g/dL (ref 0.0–0.4)
Alpha2 Glob SerPl Elph-Mcnc: 0.7 g/dL (ref 0.4–1.0)
B-Globulin SerPl Elph-Mcnc: 0.8 g/dL (ref 0.7–1.3)
Gamma Glob SerPl Elph-Mcnc: 1.7 g/dL (ref 0.4–1.8)
Globulin, Total: 3.5 g/dL (ref 2.2–3.9)
IgA: 28 mg/dL — ABNORMAL LOW (ref 87–352)
IgG (Immunoglobin G), Serum: 1689 mg/dL — ABNORMAL HIGH (ref 586–1602)
IgM (Immunoglobulin M), Srm: 313 mg/dL — ABNORMAL HIGH (ref 26–217)
M Protein SerPl Elph-Mcnc: 0.8 g/dL — ABNORMAL HIGH
Total Protein ELP: 6.9 g/dL (ref 6.0–8.5)

## 2023-03-07 MED ORDER — LIDOCAINE HCL 1 % IJ SOLN
5.0000 mL | INTRAMUSCULAR | Status: AC | PRN
  Administered 2023-03-07: 5 mL

## 2023-03-07 MED ORDER — METHYLPREDNISOLONE ACETATE 40 MG/ML IJ SUSP
40.0000 mg | INTRAMUSCULAR | Status: AC | PRN
  Administered 2023-03-07: 40 mg via INTRA_ARTICULAR

## 2023-03-07 MED ORDER — PREDNISONE 10 MG PO TABS: 10.0000 mg | ORAL_TABLET | Freq: Every day | ORAL | 0 refills | Status: DC

## 2023-03-07 NOTE — Progress Notes (Signed)
 Office Visit Note   Patient: Debra Jacobson           Date of Birth: 12/19/1960           MRN: [id number] Visit Date: 03/07/2023              Requested by: Ollen Bowl, MD [address] [place],  [place] [address] PCP: Ollen Bowl, MD  Chief Complaint  Patient presents with   Right Shoulder - Pain    Wants right shoulder injection      HPI: Patient is a 63 year old woman who presents for evaluation of her right shoulder.  Patient complains of arthritic pain in most of her joints but is having most difficulty with the right shoulder.  Patient has multiple medical problems including multiple myeloma.  Her most recent hemoglobin was 7.9.  Assessment & Plan: Visit Diagnoses:  1. Chronic right shoulder pain     Plan: Right shoulder was injected she tolerated this well.  Patient is not a good candidate to continue nonsteroidals.  Will try a short course of prednisone to help with her arthritic symptoms  Follow-Up Instructions: Return if symptoms worsen or fail to improve.   Ortho Exam  Patient is alert, oriented, no adenopathy, well-dressed, normal affect, normal respiratory effort. Examination patient has abduction and flexion to 70 degrees of the right shoulder she has pain with Neer and Hawkins impingement test.  No radicular symptoms.  Imaging: No results found. No images are attached to the encounter.  Labs: Lab Results  Component Value Date   HGBA1C 5.5 10/26/2011   HGBA1C 5.4 09/10/2011   ESRSEDRATE 14 03/28/2021   ESRSEDRATE 37 (H) 09/01/2018   LABURIC 9.3 (H) 03/28/2021     Lab Results  Component Value Date   ALBUMIN 3.7 03/04/2023   ALBUMIN 3.1 (L) 02/28/2023   ALBUMIN 3.9 09/17/2022   PREALBUMIN 9.1 (L) 10/26/2011    Lab Results  Component Value Date   MG 2.1 01/31/2021   MG 1.7 05/20/2019   Lab Results  Component Value Date   VD25OH 27 (L) 03/28/2021   VD25OH 45 03/01/2011    Lab Results  Component Value Date    PREALBUMIN 9.1 (L) 10/26/2011      Latest Ref Rng & Units 03/06/2023    2:37 PM 03/04/2023   10:01 AM 03/04/2023   10:00 AM  CBC EXTENDED  WBC 4.0 - 10.5 K/uL 7.5   8.4   RBC 3.87 - 5.11 Mil/uL 2.41  2.28  2.28   Hemoglobin 12.0 - 15.0 g/dL 7.9 Repeated and verified X2.   7.2   HCT 36.0 - 46.0 % 24.2   23.0   Platelets 150.0 - 400.0 K/uL 224.0   196   NEUT# 1.4 - 7.7 K/uL 4.7   5.4   Lymph# 0.7 - 4.0 K/uL 2.2   2.4      There is no height or weight on file to calculate BMI.  Orders:  No orders of the defined types were placed in this encounter.  Meds ordered this encounter  Medications   predniSONE (DELTASONE) 10 MG tablet    Sig: Take 1 tablet (10 mg total) by mouth daily with breakfast.    Dispense:  30 tablet    Refill:  0     Procedures: Large Joint Inj: R subacromial bursa on 03/07/2023 11:38 AM Indications: diagnostic evaluation and pain Details: 22 G 1.5 in needle, posterior approach  Arthrogram: No  Medications: 5  mL lidocaine 1 %; 40 mg methylPREDNISolone acetate 40 MG/ML Outcome: tolerated well, no immediate complications Procedure, treatment alternatives, risks and benefits explained, specific risks discussed. Consent was given by the patient. Immediately prior to procedure a time out was called to verify the correct patient, procedure, equipment, support staff and site/side marked as required. Patient was prepped and draped in the usual sterile fashion.      Clinical Data: No additional findings.  ROS:  All other systems negative, except as noted in the HPI. Review of Systems  Objective: Vital Signs: LMP 04/14/2012   Specialty Comments:  No specialty comments available.  PMFS History: Patient Active Problem List   Diagnosis Date Noted   Acute lower GI hemorrhage 02/28/2023   Hematochezia 02/28/2023   Acute blood loss anemia 02/28/2023   Diverticulosis of colon with hemorrhage 02/28/2023   Chronic pain 02/09/2022   Pain in left knee 01/30/2022    Acute alcoholic pancreatitis 05/20/2019   Alcohol dependence (HCC) 05/20/2019   Iron deficiency anemia 09/10/2018   Abnormal cervical Papanicolaou smear 08/15/2018   Anemia 08/11/2018   Chronic hepatitis C (HCC) 07/27/2013   Elevated liver function tests 07/27/2013   Overweight (BMI 25.0-29.9) 07/27/2013   Eczema 12/09/2011   Status post gastric bypass for obesity 05/30/2011   Status post bariatric surgery 05/30/2011   Past Medical History:  Diagnosis Date   Allergy    all rhinitis   Dependent edema    Dermatitis    GERD (gastroesophageal reflux disease)    Hypertension     Family History  Problem Relation Age of Onset   Hyperlipidemia Mother    Non-Hodgkin's lymphoma Father    Diabetes Maternal Grandmother    Colon cancer Neg Hx    Stomach cancer Neg Hx    Esophageal cancer Neg Hx    Colon polyps Neg Hx    Pancreatic cancer Neg Hx    Breast cancer Neg Hx     Past Surgical History:  Procedure Laterality Date   CESAREAN SECTION     x2   TUBAL LIGATION     Social History   Occupational History   Occupation: Arts administrator  Tobacco Use   Smoking status: Former    Current packs/day: 0.00    Types: Cigarettes    Quit date: 08/29/1991    Years since quitting: 31.5   Smokeless tobacco: Never  Vaping Use   Vaping status: Never Used  Substance and Sexual Activity   Alcohol use: Not Currently    Comment: Patient drinks 2 40oz beers multiple days a week   Drug use: No   Sexual activity: Not on file

## 2023-03-08 ENCOUNTER — Other Ambulatory Visit: Payer: Self-pay | Admitting: *Deleted

## 2023-03-08 DIAGNOSIS — D649 Anemia, unspecified: Secondary | ICD-10-CM

## 2023-03-11 ENCOUNTER — Inpatient Hospital Stay (HOSPITAL_BASED_OUTPATIENT_CLINIC_OR_DEPARTMENT_OTHER): Payer: Commercial Managed Care - HMO | Admitting: Hematology

## 2023-03-11 ENCOUNTER — Other Ambulatory Visit: Payer: Self-pay | Admitting: *Deleted

## 2023-03-11 DIAGNOSIS — D472 Monoclonal gammopathy: Secondary | ICD-10-CM

## 2023-03-11 DIAGNOSIS — D508 Other iron deficiency anemias: Secondary | ICD-10-CM | POA: Diagnosis not present

## 2023-03-11 DIAGNOSIS — K922 Gastrointestinal hemorrhage, unspecified: Secondary | ICD-10-CM

## 2023-03-11 DIAGNOSIS — D649 Anemia, unspecified: Secondary | ICD-10-CM

## 2023-03-11 MED ORDER — SUFLAVE 178.7 G PO SOLR: 1.0000 | Freq: Once | ORAL | 0 refills | Status: AC

## 2023-03-11 NOTE — Progress Notes (Signed)
 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 female who has been referred to [place] by Dr Belva Agee for evaluation and management of anemia. The pt reports that she is doing well overall.  The pt reports that she was first given the diagnoses of anemia about 10 years ago. Pt is currently taking 1 iron pill every other day. Pt had a gastric bypass surgery in 2014. She initially weighed about 400 lbs and is currently 164 lbs. She has been experiencing a severe lack of appetite and weight loss. When she does attempt to eat she can only take a couple of bites and then has a bowel movement soon after that. She also has abdominal pain after she eats. Pt has seasonal allergies and eczema, for which she has been taking Dupixent for 3 months. Pt has never had a skin biopsy to confirm her eczema diagnosis. It was dormant for about 10 years and came back 2 years ago after an interaction with a cat and has persisted. Pt also takes Vistaril to help with the itch. Pt has never had an allergy test or immune therapy testing. Pt took steroid shots for her carpal tunnel. There were pre-cancerous cells found in pt's pap smear and her Gynecologist is planning on doing a resection. Pt was experiencing black stools a couple of months ago, that were not painful and did not speak with her PCP about it. She had a colonoscopy in 2014 and has never had an endoscopy. Pt believes her ankle swelling is from work and wears compression socks which helps.   Most recent lab results (08/12/2018) of CBC is as follows: WBC at 3.8K, RBC at 2.49, Hgb at 7.8, HCT at 24.3, MCV at 98, MCH at 31.3, MCHC at 32.1, RDW at 15.9, Platelets at 182K.  08/12/2018 Transferrin is  286 08/12/2018 Ferritin is 30  08/12/2018 Iron and TIBC shows Iron Bind, Cap, (TIBC) at 349, UIBC at 303, Iron at 46, Iron Sat at 13.   On review of systems, pt reports pain in her left knee, weight loss, fatigue, enlarged cervical lymph nodes, black/bloody stools, abdominal pain and denies vaginal bleeds, nose bleeds gum bleeds, chest pain, SOB, fevers, chills and any other symptoms.   On PMHx the pt reports gastric bypass surgery, two cesarean sections, tubal ligation.  On Social Hx the pt reports that she has quit smoking, no alcohol use.   INTERVAL HISTORY:  Debra Jacobson is a 63 y.o. female who is here for continued evaluation and management of smoldering myeloma and multifactorial anemia.  .I connected with [name] on 03/11/2023 at  8:40 AM EDT by telephone visit and verified that I am speaking with the correct person using two identifiers.   Patient was last seen by me on 09/17/2022 and sh complained of joint pain, but was doing well overall.   Patient notes she has been doing fairly well since our last visit. She complains of chronic joint pain. She was taking OTC NSAIDs for a month to help her symptoms. She was admitted to the hospital last month due to lower GI bleeding. She was discharged with recommendation to discontinue NSAID and follow-up with [facility]. Patient notes she has been scheduled for endoscopy and  colonoscopy with her Gastroenterologist. She had CT angio scan at the hospital, which showed abnormal soft tissue mass in the ascending colon.    Patient denies PRBC transfusion at the hospital since she is jehovah's witness. She did not have IV Iron infusion when she was hospitalized.   Patient notes that she had a Erythropoietin shot around 7-8 days ago at home. She had a old prescription of the Erythropoietin injection prescription. Repeat lab on 03/06/2023 did show improved Hgb from 7.2 to 7.9 g/dL.   During this tele-med visit, she complains of  fatigue. She denies any new infection issues, fever, chills, unexpected weight loss, back pain, chest pain, SOB, GI bleeding, or leg swelling.   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   I discussed the limitations, risks, security and privacy concerns of performing an evaluation and management service by telemedicine and the availability of in-person appointments. I also discussed with the patient that there may be a patient responsible charge related to this service. The patient expressed understanding and agreed to proceed.   Other persons participating in the visit and their role in the encounter: None   Patient's location: Home  Provider's location: [facility]   Chief Complaint:  smoldering myeloma and multifactorial anemia.    MEDICAL HISTORY:  Past Medical History:  Diagnosis Date   Allergy    all rhinitis   Dependent edema    Dermatitis    GERD (gastroesophageal reflux disease)    Hypertension     SURGICAL HISTORY: Past Surgical History:  Procedure Laterality Date   CESAREAN SECTION     x2   TUBAL LIGATION      SOCIAL HISTORY: Social History   Socioeconomic History   Marital status: Legally Separated    Spouse name: Not on file   Number of children: 3   Years of education: Not on file   Highest education level: Not on file  Occupational History   Occupation: Travel nurse  Tobacco Use   Smoking status: Former    Current packs/day: 0.00    Types: Cigarettes    Quit date: 08/29/1991    Years since quitting: 31.5   Smokeless tobacco: Never  Vaping Use   Vaping status: Never Used  Substance and Sexual Activity   Alcohol use: Not Currently    Comment: Patient drinks 2 40oz beers multiple days a week   Drug use: No   Sexual activity: Not on file  Other Topics Concern   Not on file  Social History Narrative   Not on file   Social Drivers of Health   Financial Resource Strain: Not on file  Food Insecurity:  No Food Insecurity (03/01/2023)   Hunger Vital Sign    Worried About Running Out of Food in the Last Year: Never true    Ran Out of Food in the Last Year: Never true  Transportation Needs: Unknown (03/01/2023)   PRAPARE - Transportation    Lack of Transportation (Medical): No    Lack of Transportation (Non-Medical): Patient declined  Physical Activity: Not on file  Stress: Not on file  Social Connections: Unknown (05/11/2021)   [facility]   Social Network    Social Network: Not on file  Intimate Partner Violence: Not At Risk (03/01/2023)   Humiliation, Afraid, Rape, and Kick questionnaire    Fear of Current or Ex-Partner: No    Emotionally Abused: No    Physically  Abused: No    Sexually Abused: No    FAMILY HISTORY: Family History  Problem Relation Age of Onset   Hyperlipidemia Mother    Non-Hodgkin's lymphoma Father    Diabetes Maternal Grandmother    Colon cancer Neg Hx    Stomach cancer Neg Hx    Esophageal cancer Neg Hx    Colon polyps Neg Hx    Pancreatic cancer Neg Hx    Breast cancer Neg Hx     ALLERGIES:  has no known allergies.  MEDICATIONS:  Current Outpatient Medications  Medication Sig Dispense Refill   AIRSUPRA 90-80 MCG/ACT AERO Inhale 2 puffs into the lungs every 4 (four) hours as needed (SOB/Wheezing).     furosemide (LASIX) 40 MG tablet Take 40 mg by mouth as needed for edema.     gabapentin (NEURONTIN) 100 MG capsule Take 200mg  (2 caps) in the morning and 400mg  (4 caps) at bedtime (Patient taking differently: Take 100 mg by mouth See admin instructions. Take 300mg  (3 caps) in the morning and 300mg  (3 caps) at bedtime) 180 capsule 2   methocarbamol (ROBAXIN) 500 MG tablet Take 500 mg by mouth 3 (three) times daily as needed for muscle spasms.     omeprazole (PRILOSEC) 40 MG capsule TAKE 1 CAPSULE(40 MG) BY MOUTH DAILY (Patient taking differently: Take 40 mg by mouth daily.) 30 capsule 5   predniSONE (DELTASONE) 10 MG tablet  Take 1 tablet (10 mg total) by mouth daily with breakfast. 30 tablet 0   No current facility-administered medications for this visit.    REVIEW OF SYSTEMS:   10 Point review of Systems was done is negative except as noted above.  PHYSICAL EXAMINATION: TELE-MED VISIT  LABORATORY DATA:  I have reviewed the data as listed  .    Latest Ref Rng & Units 03/06/2023    2:37 PM 03/04/2023   10:00 AM 03/01/2023    4:42 AM  CBC  WBC 4.0 - 10.5 K/uL 7.5  8.4  8.6   Hemoglobin 12.0 - 15.0 g/dL 7.9 Repeated and verified X2.  7.2  7.3   Hematocrit 36.0 - 46.0 % 24.2  23.0  24.6   Platelets 150.0 - 400.0 K/uL 224.0  196  181    . CBC    Component Value Date/Time   WBC 7.5 03/06/2023 1437   RBC 2.41 (L) 03/06/2023 1437   HGB 7.9 Repeated and verified X2. (LL) 03/06/2023 1437   HGB 7.2 (L) 03/04/2023 1000   HCT 24.2 (L) 03/06/2023 1437   HCT 26.5 (L) 02/13/2021 1258   PLT 224.0 03/06/2023 1437   PLT 196 03/04/2023 1000   MCV 100.6 (H) 03/06/2023 1437   MCH 31.6 03/04/2023 1000   MCHC 32.7 03/06/2023 1437   RDW 15.8 (H) 03/06/2023 1437   LYMPHSABS 2.2 03/06/2023 1437   MONOABS 0.6 03/06/2023 1437   EOSABS 0.1 03/06/2023 1437   BASOSABS 0.0 03/06/2023 1437    .    Latest Ref Rng & Units 03/04/2023   10:00 AM 03/01/2023    4:42 AM 02/28/2023   12:32 PM  CMP  Glucose 70 - 99 mg/dL 93  99  086   BUN 8 - 23 mg/dL 18  17  19    Creatinine 0.44 - 1.00 mg/dL 5.78  4.69  6.29   Sodium 135 - 145 mmol/L 136  136  141   Potassium 3.5 - 5.1 mmol/L 3.8  4.0  3.9   Chloride 98 - 111 mmol/L 107  109  110   CO2 22 - 32 mmol/L 25  21    Calcium 8.9 - 10.3 mg/dL 8.9  8.4    Total Protein 6.5 - 8.1 g/dL 7.4     Total Bilirubin 0.0 - 1.2 mg/dL 0.3     Alkaline Phos 38 - 126 U/L 135     AST 15 - 41 U/L 40     ALT 0 - 44 U/L 28      . Lab Results  Component Value Date   IRON 36 (L) 03/06/2023   TIBC 382.2 03/06/2023   IRONPCTSAT 9.4 (L) 03/06/2023   (Iron and TIBC)  Lab Results  Component  Value Date   FERRITIN 14.6 03/06/2023   09/22/2018 Bone Marrow Report   09/22/2018 Bone Marrow report    09/22/2018 FISH Analysis    09/22/2018 Cytogenetics  Cytogenetics done 07/28/2021 revealed    Molecular pathology done 07/28/2021 revealed   Bone marrow biopsy done 07/28/2021 revealed "DIAGNOSIS:   BONE MARROW, ASPIRATE, CLOT, CORE:  -Variably cellular bone marrow with plasma cell neoplasm  -See comment   PERIPHERAL BLOOD:  - Macrocytic anemia   COMMENT:   The bone marrow is generally normocellular for age with increased number  of plasma cells representing 11% of all cells in the aspirate with lack  of large aggregates or sheets in the clot/biopsy sections.  The plasma  cells display kappa light chain restriction consistent with plasma cell  neoplasm.  The background shows trilineage hematopoiesis with generally  nonspecific changes possibly related to history of liver disease,  alcohol consumption, etc.  Nonetheless, correlation with cytogenetic and  FISH studies is recommended."  RADIOGRAPHIC STUDIES: I have personally reviewed the radiological images as listed and agreed with the findings in the report. CT ANGIO GI BLEED Result Date: 02/28/2023 CLINICAL DATA:  rectal bleeding. Two episodes of bright red blood from rectum. * Tracking Code: BO * EXAM: CTA ABDOMEN AND PELVIS WITHOUT AND WITH CONTRAST TECHNIQUE: Multidetector CT imaging of the abdomen and pelvis was performed using the standard protocol during bolus administration of intravenous contrast. Multiplanar reconstructed images and MIPs were obtained and reviewed to evaluate the vascular anatomy. RADIATION DOSE REDUCTION: This exam was performed according to the departmental dose-optimization program which includes automated exposure control, adjustment of the mA and/or kV according to patient size and/or use of iterative reconstruction technique. CONTRAST:  OMNIPAQUE IOHEXOL 350 MG/ML SOLN COMPARISON:   CT scan abdomen and pelvis from 05/20/2019. FINDINGS: VASCULAR Aorta: Normal caliber aorta without aneurysm, dissection, vasculitis or significant stenosis. Celiac: Patent without evidence of aneurysm, dissection, vasculitis or significant stenosis. SMA: Patent without evidence of aneurysm, dissection, vasculitis or significant stenosis. Renals: Both renal arteries are patent without evidence of aneurysm, dissection, vasculitis, fibromuscular dysplasia or significant stenosis. IMA: Patent without evidence of aneurysm, dissection, vasculitis or significant stenosis. Inflow: Patent without evidence of aneurysm, dissection, vasculitis or significant stenosis. Proximal Outflow: Bilateral common femoral and visualized portions of the superficial and profunda femoral arteries are patent without evidence of aneurysm, dissection, vasculitis or significant stenosis. Veins: No obvious venous abnormality within the limitations of this arterial phase study. Review of the MIP images confirms the above findings. NON-VASCULAR Lower chest: There are irregular, linear opacities in the visualized bilateral lungs with patchy areas of bronchiectasis. Findings may represent fibrosis/scarring and/or atelectasis. Correlate clinically. No pleural effusion. The heart is normal in size. No pericardial effusion. Hepatobiliary: The liver is normal in size. Non-cirrhotic configuration. No suspicious mass. No intrahepatic or extrahepatic bile  duct dilation. There is markedly distended gallbladder without abnormal wall thickening or pericholecystic inflammatory changes. No calcified gallstones. Findings favor gallbladder hydrops. Correlate clinically. Pancreas: Unremarkable. No pancreatic ductal dilatation or surrounding inflammatory changes. Spleen: Within normal limits. No focal lesion. Adrenals/Urinary Tract: Adrenal glands are unremarkable. No suspicious renal mass. No hydronephrosis. No renal or ureteric calculi. Unremarkable urinary  bladder. Stomach/Bowel: No disproportionate dilation of the small or large bowel loops. There is irregular moderate enhancing soft tissue attenuation area in the proximal ascending colon which is concerning for neoplastic process. Further evaluation with colonoscopy and tissue sampling is recommended. 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 There is no active extravasation of contrast to suggest active GI bleeding. Vascular/Lymphatic: No ascites or pneumoperitoneum. No abdominal or pelvic lymphadenopathy, by size criteria. No aneurysmal dilation of the major abdominal arteries. There are mild peripheral atherosclerotic vascular calcifications of the aorta and its major branches. Reproductive: Bulky lobulated uterus noted, likely secondary to underlying leiomyomas. Please note reported organs are not well evaluated on the CT scan exam. No large adnexal mass seen. Other: The visualized soft tissues and abdominal wall are unremarkable. Musculoskeletal: No suspicious osseous lesions. There are mild - moderate multilevel degenerative changes in the visualized spine, with asymmetric moderate-to-severe involvement of L4-5 intervertebral disc. IMPRESSION: 1. Irregular moderate enhancing soft tissue in the proximal ascending colon is concerning for neoplastic process. Further evaluation with colonoscopy and tissue sampling is recommended. 2. No active extravasation of contrast noted to suggest active GI bleeding. 3. Markedly distended gallbladder without abnormal wall thickening or pericholecystic inflammatory changes. Findings favor gallbladder hydrops. 4. No metastatic disease identified within the abdomen or pelvis. 5. Multiple other nonacute observations, as described above. 6. Aortic atherosclerosis. Aortic Atherosclerosis (ICD10-I70.0). Electronically Signed   By: Jules Schick M.D.   On: 02/28/2023 16:32    ASSESSMENT & PLAN:   1)  significant macrocytic anemia likely multifactorial but significant concern for liver cirrhosis and alcohol abuse.  Other factors Vitamin B12 deficiency and other potential nutritional deficiencyes caused by Gastric bypass surgery . Also had dyserythropoiesis on BM Bx.  Currently active significant alcohol abuse and liver disease are also significant contributing factors. 2) Iron deficiency- GI blood loss vs poor absorption related to Gastric bypass surgery. 09/17/2018 Occult blood card to lab is "POSITIVE" x2.  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  Recent admission with confusion and hepatic encephalopathy. 6) noncompliance with medical follow-up 7) Leg swelling -Down with diuresis. -[place] results not suggestive of DVT  PLAN: -Discussed lab results from 03/06/2023 in detail with the patient. CBC shows low hemoglobin of 7.9 g/dL with hematocrit of 16.1%. CMP stable from 03/04/2023. Labs from 03/04/2023: Ferritin level of 23 with iron saturation ratio of 9. Immature retic. Fraction of 31.3%. Vitamin B-12 level elevated at 3,226.  -Multiple myeloma panel results from 03/04/2023 showed elevated but improved M-protein of 0.8 (from 1.8). Immunofixation shows IgG monoclonal protein with kappa light chain specificity. Kappa/lambda light chain ratio elevated at 3.34.  -Recommend the patient to go to the ER ASAP  if she develops GI bleeding and lightheadedness/dizziness.  -Discussed with the  patient that since she is iron-deficient, we do recommend IV Iron infusion. Patient agrees.  -Patient has 1 or 2 Erythropoietin shot at home. She will continue this with IV Iron infusion.   -CT angio scan showed possible concerns for colon cancer. Recommend to follow-up with her Gastroenterologist. -Continue procrit 10,000 units per week, Plan to hold if hgb is above 9.0 g/dL.  -Patient is Erroll Luna witness and she will not receive PRBC transfusion.  -Answered all of patient's questions.   FOLLOW-UP: Weekly Venofer 300mg  x 4 doses with labs Procrit weekly if Hgb<9 (patient is Jehovas witness) F/u with GI as scheduled for colonoscopy for concern for colonic mass RTC with Dr Candise Che in 4 weeks  The total time spent in the appointment was 30 minutes* .  All of the patient's questions were answered with apparent satisfaction. The patient knows to call the clinic with any problems, questions or concerns.   Wyvonnia Lora MD MS AAHIVMS [facility] Hematology/Oncology Physician [facility]  .*Total Encounter Time as defined by the Centers for Medicare and Medicaid Services includes, in addition to the face-to-face time of a patient visit (documented in the note above) non-face-to-face time: obtaining and reviewing outside history, ordering and reviewing medications, tests or procedures, care coordination (communications with other health care professionals or caregivers) and documentation in the medical record.   I,Param Shah,acting as a Neurosurgeon for Wyvonnia Lora, MD.,have documented all relevant documentation on the behalf of Wyvonnia Lora, MD,as directed by  Wyvonnia Lora, MD while in the presence of Wyvonnia Lora, MD.  .I have reviewed the above documentation for accuracy and completeness, and I agree with the above. Johney Maine MD

## 2023-03-16 ENCOUNTER — Encounter: Payer: Self-pay | Admitting: Hematology

## 2023-03-21 ENCOUNTER — Inpatient Hospital Stay

## 2023-03-21 ENCOUNTER — Other Ambulatory Visit: Payer: Self-pay

## 2023-03-21 DIAGNOSIS — D508 Other iron deficiency anemias: Secondary | ICD-10-CM

## 2023-03-21 DIAGNOSIS — D649 Anemia, unspecified: Secondary | ICD-10-CM

## 2023-03-21 DIAGNOSIS — D472 Monoclonal gammopathy: Secondary | ICD-10-CM

## 2023-03-21 DIAGNOSIS — E538 Deficiency of other specified B group vitamins: Secondary | ICD-10-CM

## 2023-03-22 ENCOUNTER — Inpatient Hospital Stay

## 2023-03-22 VITALS — BP 136/94 | HR 69 | Temp 97.7°F | Resp 16

## 2023-03-22 DIAGNOSIS — E538 Deficiency of other specified B group vitamins: Secondary | ICD-10-CM

## 2023-03-22 DIAGNOSIS — D472 Monoclonal gammopathy: Secondary | ICD-10-CM | POA: Diagnosis not present

## 2023-03-22 DIAGNOSIS — D508 Other iron deficiency anemias: Secondary | ICD-10-CM

## 2023-03-22 DIAGNOSIS — D649 Anemia, unspecified: Secondary | ICD-10-CM

## 2023-03-22 DIAGNOSIS — Z9884 Bariatric surgery status: Secondary | ICD-10-CM

## 2023-03-22 LAB — CBC WITH DIFFERENTIAL (CANCER CENTER ONLY)
Abs Immature Granulocytes: 0.05 10*3/uL (ref 0.00–0.07)
Basophils Absolute: 0 10*3/uL (ref 0.0–0.1)
Basophils Relative: 0 %
Eosinophils Absolute: 0 10*3/uL (ref 0.0–0.5)
Eosinophils Relative: 0 %
HCT: 25.4 % — ABNORMAL LOW (ref 36.0–46.0)
Hemoglobin: 7.7 g/dL — ABNORMAL LOW (ref 12.0–15.0)
Immature Granulocytes: 1 %
Lymphocytes Relative: 10 %
Lymphs Abs: 1.1 10*3/uL (ref 0.7–4.0)
MCH: 29.2 pg (ref 26.0–34.0)
MCHC: 30.3 g/dL (ref 30.0–36.0)
MCV: 96.2 fL (ref 80.0–100.0)
Monocytes Absolute: 0.5 10*3/uL (ref 0.1–1.0)
Monocytes Relative: 5 %
Neutro Abs: 8.7 10*3/uL — ABNORMAL HIGH (ref 1.7–7.7)
Neutrophils Relative %: 84 %
Platelet Count: 229 10*3/uL (ref 150–400)
RBC: 2.64 MIL/uL — ABNORMAL LOW (ref 3.87–5.11)
RDW: 14.5 % (ref 11.5–15.5)
WBC Count: 10.3 10*3/uL (ref 4.0–10.5)
nRBC: 0 % (ref 0.0–0.2)

## 2023-03-22 LAB — CMP (CANCER CENTER ONLY)
ALT: 28 U/L (ref 0–44)
AST: 26 U/L (ref 15–41)
Albumin: 3.9 g/dL (ref 3.5–5.0)
Alkaline Phosphatase: 107 U/L (ref 38–126)
Anion gap: 6 (ref 5–15)
BUN: 24 mg/dL — ABNORMAL HIGH (ref 8–23)
CO2: 22 mmol/L (ref 22–32)
Calcium: 8.9 mg/dL (ref 8.9–10.3)
Chloride: 110 mmol/L (ref 98–111)
Creatinine: 0.9 mg/dL (ref 0.44–1.00)
GFR, Estimated: >60 mL/min (ref >60)
Glucose, Bld: 109 mg/dL — ABNORMAL HIGH (ref 70–99)
Potassium: 4.1 mmol/L (ref 3.5–5.1)
Sodium: 138 mmol/L (ref 135–145)
Total Bilirubin: 0.3 mg/dL (ref 0.0–1.2)
Total Protein: 7.9 g/dL (ref 6.5–8.1)

## 2023-03-22 LAB — FERRITIN: Ferritin: 7 ng/mL — ABNORMAL LOW (ref 11–307)

## 2023-03-22 LAB — RETICULOCYTES
Immature Retic Fract: 16.9 % — ABNORMAL HIGH (ref 2.3–15.9)
RBC.: 2.6 MIL/uL — ABNORMAL LOW (ref 3.87–5.11)
Retic Count, Absolute: 59.5 10*3/uL (ref 19.0–186.0)
Retic Ct Pct: 2.3 % (ref 0.4–3.1)

## 2023-03-22 LAB — IRON AND IRON BINDING CAPACITY (CC-WL,HP ONLY)
Iron: 30 ug/dL (ref 28–170)
Saturation Ratios: 7 % — ABNORMAL LOW (ref 10.4–31.8)
TIBC: 437 ug/dL (ref 250–450)
UIBC: 407 ug/dL (ref 148–442)

## 2023-03-22 LAB — VITAMIN B12: Vitamin B-12: 1213 pg/mL — ABNORMAL HIGH (ref 180–914)

## 2023-03-22 MED ORDER — SODIUM CHLORIDE 0.9 % IV SOLN
300.0000 mg | Freq: Once | INTRAVENOUS | Status: AC
  Administered 2023-03-22: 300 mg via INTRAVENOUS
  Filled 2023-03-22: qty 300

## 2023-03-22 MED ORDER — SODIUM CHLORIDE 0.9 % IV SOLN: INTRAVENOUS | Status: DC

## 2023-03-22 MED ORDER — ACETAMINOPHEN 325 MG PO TABS
650.0000 mg | ORAL_TABLET | Freq: Once | ORAL | Status: DC
  Filled 2023-03-22: qty 2

## 2023-03-22 MED ORDER — LORATADINE 10 MG PO TABS
10.0000 mg | ORAL_TABLET | Freq: Once | ORAL | Status: DC
Start: 2023-03-22 — End: 2023-03-22

## 2023-03-22 NOTE — Progress Notes (Signed)
 Patient declined a 30 minute observation- tolerates her iron well. VSS-  BP (!) 136/94 (BP Location: Right Arm, Patient Position: Sitting)   Pulse 69   Temp 97.7 F (36.5 C) (Oral)   Resp 16   LMP 04/14/2012   SpO2 100%

## 2023-03-22 NOTE — Progress Notes (Signed)
 Patient takes extra strength tylenol and claritin every morning for chronic leg pain and seasonal allergies- declined 30 minute wait for iron (premeds in).

## 2023-03-22 NOTE — Patient Instructions (Signed)
Iron Sucrose Injection What is this medication? IRON SUCROSE (EYE ern SOO krose) treats low levels of iron (iron deficiency anemia) in people with kidney disease. Iron is a mineral that plays an important role in making red blood cells, which carry oxygen from your lungs to the rest of your body. This medicine may be used for other purposes; ask your health care provider or pharmacist if you have questions. COMMON BRAND NAME(S): Venofer What should I tell my care team before I take this medication? They need to know if you have any of these conditions: Anemia not caused by low iron levels Heart disease High levels of iron in the blood Kidney disease Liver disease An unusual or allergic reaction to iron, other medications, foods, dyes, or preservatives Pregnant or trying to get pregnant Breast-feeding How should I use this medication? This medication is for infusion into a vein. It is given in a hospital or clinic setting. Talk to your care team about the use of this medication in children. While this medication may be prescribed for children as young as 2 years for selected conditions, precautions do apply. Overdosage: If you think you have taken too much of this medicine contact a poison control center or emergency room at once. NOTE: This medicine is only for you. Do not share this medicine with others. What if I miss a dose? It is important not to miss your dose. Call your care team if you are unable to keep an appointment. What may interact with this medication? Do not take this medication with any of the following: Deferoxamine Dimercaprol Other iron products This medication may also interact with the following: Chloramphenicol Deferasirox This list may not describe all possible interactions. Give your health care provider a list of all the medicines, herbs, non-prescription drugs, or dietary supplements you use. Also tell them if you smoke, drink alcohol, or use illegal drugs.  Some items may interact with your medicine. What should I watch for while using this medication? Visit your care team regularly. Tell your care team if your symptoms do not start to get better or if they get worse. You may need blood work done while you are taking this medication. You may need to follow a special diet. Talk to your care team. Foods that contain iron include: whole grains/cereals, dried fruits, beans, or peas, leafy green vegetables, and organ meats (liver, kidney). What side effects may I notice from receiving this medication? Side effects that you should report to your care team as soon as possible: Allergic reactions--skin rash, itching, hives, swelling of the face, lips, tongue, or throat Low blood pressure--dizziness, feeling faint or lightheaded, blurry vision Shortness of breath Side effects that usually do not require medical attention (report to your care team if they continue or are bothersome): Flushing Headache Joint pain Muscle pain Nausea Pain, redness, or irritation at injection site This list may not describe all possible side effects. Call your doctor for medical advice about side effects. You may report side effects to FDA at 1-800-FDA-1088. Where should I keep my medication? This medication is given in a hospital or clinic and will not be stored at home. NOTE: This sheet is a summary. It may not cover all possible information. If you have questions about this medicine, talk to your doctor, pharmacist, or health care provider.  2023 Elsevier/Gold Standard (2020-05-13 00:00:00)  

## 2023-03-25 LAB — KAPPA/LAMBDA LIGHT CHAINS
Kappa free light chain: 49.2 mg/L — ABNORMAL HIGH (ref 3.3–19.4)
Kappa, lambda light chain ratio: 2.95 — ABNORMAL HIGH (ref 0.26–1.65)
Lambda free light chains: 16.7 mg/L (ref 5.7–26.3)

## 2023-03-26 ENCOUNTER — Other Ambulatory Visit: Payer: Self-pay | Admitting: Hematology

## 2023-03-26 DIAGNOSIS — D472 Monoclonal gammopathy: Secondary | ICD-10-CM

## 2023-03-26 LAB — MULTIPLE MYELOMA PANEL, SERUM
Albumin SerPl Elph-Mcnc: 3.6 g/dL (ref 2.9–4.4)
Albumin/Glob SerPl: 1 (ref 0.7–1.7)
Alpha 1: 0.2 g/dL (ref 0.0–0.4)
Alpha2 Glob SerPl Elph-Mcnc: 0.7 g/dL (ref 0.4–1.0)
B-Globulin SerPl Elph-Mcnc: 0.9 g/dL (ref 0.7–1.3)
Gamma Glob SerPl Elph-Mcnc: 1.9 g/dL — ABNORMAL HIGH (ref 0.4–1.8)
Globulin, Total: 3.7 g/dL (ref 2.2–3.9)
IgA: 33 mg/dL — ABNORMAL LOW (ref 87–352)
IgG (Immunoglobin G), Serum: 1944 mg/dL — ABNORMAL HIGH (ref 586–1602)
IgM (Immunoglobulin M), Srm: 376 mg/dL — ABNORMAL HIGH (ref 26–217)
M Protein SerPl Elph-Mcnc: 1.1 g/dL — ABNORMAL HIGH
Total Protein ELP: 7.3 g/dL (ref 6.0–8.5)

## 2023-03-28 ENCOUNTER — Other Ambulatory Visit: Payer: Self-pay

## 2023-03-28 DIAGNOSIS — D472 Monoclonal gammopathy: Secondary | ICD-10-CM

## 2023-03-28 DIAGNOSIS — D508 Other iron deficiency anemias: Secondary | ICD-10-CM

## 2023-03-29 ENCOUNTER — Inpatient Hospital Stay

## 2023-03-29 VITALS — BP 133/92 | HR 68 | Resp 16

## 2023-03-29 DIAGNOSIS — D472 Monoclonal gammopathy: Secondary | ICD-10-CM

## 2023-03-29 DIAGNOSIS — Z9884 Bariatric surgery status: Secondary | ICD-10-CM

## 2023-03-29 DIAGNOSIS — D508 Other iron deficiency anemias: Secondary | ICD-10-CM

## 2023-03-29 LAB — IRON AND IRON BINDING CAPACITY (CC-WL,HP ONLY)
Iron: 48 ug/dL (ref 28–170)
Saturation Ratios: 10 % — ABNORMAL LOW (ref 10.4–31.8)
TIBC: 466 ug/dL — ABNORMAL HIGH (ref 250–450)
UIBC: 418 ug/dL (ref 148–442)

## 2023-03-29 LAB — CBC WITH DIFFERENTIAL (CANCER CENTER ONLY)
Abs Immature Granulocytes: 0.06 10*3/uL (ref 0.00–0.07)
Basophils Absolute: 0 10*3/uL (ref 0.0–0.1)
Basophils Relative: 0 %
Eosinophils Absolute: 0 10*3/uL (ref 0.0–0.5)
Eosinophils Relative: 0 %
HCT: 30.6 % — ABNORMAL LOW (ref 36.0–46.0)
Hemoglobin: 9.2 g/dL — ABNORMAL LOW (ref 12.0–15.0)
Immature Granulocytes: 1 %
Lymphocytes Relative: 9 %
Lymphs Abs: 1 10*3/uL (ref 0.7–4.0)
MCH: 29.6 pg (ref 26.0–34.0)
MCHC: 30.1 g/dL (ref 30.0–36.0)
MCV: 98.4 fL (ref 80.0–100.0)
Monocytes Absolute: 0.5 10*3/uL (ref 0.1–1.0)
Monocytes Relative: 5 %
Neutro Abs: 9.3 10*3/uL — ABNORMAL HIGH (ref 1.7–7.7)
Neutrophils Relative %: 85 %
Platelet Count: 240 10*3/uL (ref 150–400)
RBC: 3.11 MIL/uL — ABNORMAL LOW (ref 3.87–5.11)
RDW: 16.6 % — ABNORMAL HIGH (ref 11.5–15.5)
WBC Count: 10.9 10*3/uL — ABNORMAL HIGH (ref 4.0–10.5)
nRBC: 0 % (ref 0.0–0.2)

## 2023-03-29 LAB — CMP (CANCER CENTER ONLY)
ALT: 40 U/L (ref 0–44)
AST: 36 U/L (ref 15–41)
Albumin: 4.4 g/dL (ref 3.5–5.0)
Alkaline Phosphatase: 114 U/L (ref 38–126)
Anion gap: 7 (ref 5–15)
BUN: 28 mg/dL — ABNORMAL HIGH (ref 8–23)
CO2: 24 mmol/L (ref 22–32)
Calcium: 9.4 mg/dL (ref 8.9–10.3)
Chloride: 105 mmol/L (ref 98–111)
Creatinine: 1.06 mg/dL — ABNORMAL HIGH (ref 0.44–1.00)
GFR, Estimated: 59 mL/min — ABNORMAL LOW (ref >60)
Glucose, Bld: 99 mg/dL (ref 70–99)
Potassium: 4.5 mmol/L (ref 3.5–5.1)
Sodium: 136 mmol/L (ref 135–145)
Total Bilirubin: 0.3 mg/dL (ref 0.0–1.2)
Total Protein: 8.8 g/dL — ABNORMAL HIGH (ref 6.5–8.1)

## 2023-03-29 LAB — FERRITIN: Ferritin: 104 ng/mL (ref 11–307)

## 2023-03-29 MED ORDER — SODIUM CHLORIDE 0.9 % IV SOLN: INTRAVENOUS | Status: DC

## 2023-03-29 MED ORDER — SODIUM CHLORIDE 0.9 % IV SOLN
300.0000 mg | Freq: Once | INTRAVENOUS | Status: AC
  Administered 2023-03-29: 300.0065 mg via INTRAVENOUS
  Filled 2023-03-29: qty 300

## 2023-03-29 NOTE — Patient Instructions (Signed)

## 2023-03-29 NOTE — Progress Notes (Addendum)
 Patient states she took Tylenol and Claritin this AM prior to appointment.  Patient declined to stay for 30 minute observation following Venofer infusion. Vital signs stable, discharged to lobby ambulatory.

## 2023-04-02 ENCOUNTER — Encounter: Payer: Self-pay | Admitting: Hematology

## 2023-04-04 ENCOUNTER — Inpatient Hospital Stay

## 2023-04-04 ENCOUNTER — Other Ambulatory Visit: Payer: Self-pay

## 2023-04-04 DIAGNOSIS — D472 Monoclonal gammopathy: Secondary | ICD-10-CM

## 2023-04-05 ENCOUNTER — Encounter: Payer: Self-pay | Admitting: Physician Assistant

## 2023-04-05 ENCOUNTER — Inpatient Hospital Stay: Attending: Hematology

## 2023-04-05 ENCOUNTER — Ambulatory Visit (INDEPENDENT_AMBULATORY_CARE_PROVIDER_SITE_OTHER): Payer: No Typology Code available for payment source | Admitting: Physician Assistant

## 2023-04-05 ENCOUNTER — Inpatient Hospital Stay

## 2023-04-05 VITALS — BP 120/78 | HR 62 | Ht 67.5 in | Wt 184.0 lb

## 2023-04-05 VITALS — BP 114/92 | HR 58 | Resp 16

## 2023-04-05 DIAGNOSIS — Z9884 Bariatric surgery status: Secondary | ICD-10-CM | POA: Diagnosis not present

## 2023-04-05 DIAGNOSIS — K219 Gastro-esophageal reflux disease without esophagitis: Secondary | ICD-10-CM | POA: Diagnosis not present

## 2023-04-05 DIAGNOSIS — R195 Other fecal abnormalities: Secondary | ICD-10-CM

## 2023-04-05 DIAGNOSIS — F1090 Alcohol use, unspecified, uncomplicated: Secondary | ICD-10-CM

## 2023-04-05 DIAGNOSIS — K8681 Exocrine pancreatic insufficiency: Secondary | ICD-10-CM | POA: Diagnosis not present

## 2023-04-05 DIAGNOSIS — C9000 Multiple myeloma not having achieved remission: Secondary | ICD-10-CM

## 2023-04-05 DIAGNOSIS — D509 Iron deficiency anemia, unspecified: Secondary | ICD-10-CM | POA: Insufficient documentation

## 2023-04-05 DIAGNOSIS — K7460 Unspecified cirrhosis of liver: Secondary | ICD-10-CM

## 2023-04-05 DIAGNOSIS — Z79899 Other long term (current) drug therapy: Secondary | ICD-10-CM | POA: Diagnosis not present

## 2023-04-05 DIAGNOSIS — D472 Monoclonal gammopathy: Secondary | ICD-10-CM | POA: Insufficient documentation

## 2023-04-05 DIAGNOSIS — R933 Abnormal findings on diagnostic imaging of other parts of digestive tract: Secondary | ICD-10-CM

## 2023-04-05 DIAGNOSIS — E538 Deficiency of other specified B group vitamins: Secondary | ICD-10-CM | POA: Insufficient documentation

## 2023-04-05 DIAGNOSIS — Z87891 Personal history of nicotine dependence: Secondary | ICD-10-CM | POA: Diagnosis not present

## 2023-04-05 DIAGNOSIS — F1091 Alcohol use, unspecified, in remission: Secondary | ICD-10-CM

## 2023-04-05 DIAGNOSIS — D508 Other iron deficiency anemias: Secondary | ICD-10-CM

## 2023-04-05 DIAGNOSIS — K8689 Other specified diseases of pancreas: Secondary | ICD-10-CM

## 2023-04-05 DIAGNOSIS — D649 Anemia, unspecified: Secondary | ICD-10-CM

## 2023-04-05 LAB — CMP (CANCER CENTER ONLY)
ALT: 37 U/L (ref 0–44)
AST: 34 U/L (ref 15–41)
Albumin: 3.8 g/dL (ref 3.5–5.0)
Alkaline Phosphatase: 98 U/L (ref 38–126)
Anion gap: 8 (ref 5–15)
BUN: 23 mg/dL (ref 8–23)
CO2: 20 mmol/L — ABNORMAL LOW (ref 22–32)
Calcium: 8.7 mg/dL — ABNORMAL LOW (ref 8.9–10.3)
Chloride: 109 mmol/L (ref 98–111)
Creatinine: 1 mg/dL (ref 0.44–1.00)
GFR, Estimated: >60 mL/min (ref >60)
Glucose, Bld: 135 mg/dL — ABNORMAL HIGH (ref 70–99)
Potassium: 3.8 mmol/L (ref 3.5–5.1)
Sodium: 137 mmol/L (ref 135–145)
Total Bilirubin: 0.2 mg/dL (ref 0.0–1.2)
Total Protein: 7.5 g/dL (ref 6.5–8.1)

## 2023-04-05 LAB — CBC WITH DIFFERENTIAL (CANCER CENTER ONLY)
Abs Immature Granulocytes: 0.03 10*3/uL (ref 0.00–0.07)
Basophils Absolute: 0 10*3/uL (ref 0.0–0.1)
Basophils Relative: 0 %
Eosinophils Absolute: 0.1 10*3/uL (ref 0.0–0.5)
Eosinophils Relative: 1 %
HCT: 29.2 % — ABNORMAL LOW (ref 36.0–46.0)
Hemoglobin: 8.7 g/dL — ABNORMAL LOW (ref 12.0–15.0)
Immature Granulocytes: 0 %
Lymphocytes Relative: 13 %
Lymphs Abs: 0.9 10*3/uL (ref 0.7–4.0)
MCH: 29.8 pg (ref 26.0–34.0)
MCHC: 29.8 g/dL — ABNORMAL LOW (ref 30.0–36.0)
MCV: 100 fL (ref 80.0–100.0)
Monocytes Absolute: 0.4 10*3/uL (ref 0.1–1.0)
Monocytes Relative: 6 %
Neutro Abs: 5.5 10*3/uL (ref 1.7–7.7)
Neutrophils Relative %: 80 %
Platelet Count: 225 10*3/uL (ref 150–400)
RBC: 2.92 MIL/uL — ABNORMAL LOW (ref 3.87–5.11)
RDW: 17.9 % — ABNORMAL HIGH (ref 11.5–15.5)
WBC Count: 6.9 10*3/uL (ref 4.0–10.5)
nRBC: 0 % (ref 0.0–0.2)

## 2023-04-05 MED ORDER — SODIUM CHLORIDE 0.9 % IV SOLN
300.0000 mg | Freq: Once | INTRAVENOUS | Status: AC
  Administered 2023-04-05: 300 mg via INTRAVENOUS
  Filled 2023-04-05: qty 200

## 2023-04-05 MED ORDER — SODIUM CHLORIDE 0.9 % IV SOLN: Freq: Once | INTRAVENOUS | Status: AC

## 2023-04-05 MED ORDER — ACETAMINOPHEN 325 MG PO TABS: 650.0000 mg | ORAL_TABLET | Freq: Once | ORAL | Status: DC

## 2023-04-05 MED ORDER — LORATADINE 10 MG PO TABS: 10.0000 mg | ORAL_TABLET | Freq: Once | ORAL | Status: DC

## 2023-04-05 NOTE — Progress Notes (Signed)
 04/05/2023 Debra Jacobson [id number] 22-Apr-1960  Referring provider: Ollen Bowl, MD Primary GI doctor: Dr. Rhea Belton  ASSESSMENT AND PLAN:   Multifactorial chronic anemia/B12 deficiency  with remote gastric bypass history, smoldering multiple myeloma following with Dr. Candise Che last seen 03/11/2023, [employer]  patient continued to have worsening anemia FOBT positive stools 02/28/2023 had CTA 02/2019 that showed irregular moderate soft tissue  small ascending colon patient being treated with iron IV iron, EPO and B12 last IV iron 4/4, no overt GI bleeding 04/05/2023  HGB 8.7 MCV 100.0 Platelets 225 03/29/2023 Iron 48 Ferritin 104 B12 1,213  05/23/2021 colonoscopy for diarrhea, quality of bowel prep for your clearing to good except cecum unremarkable  02/28/2023 CT angio shows irregular moderate enhancing soft tissue proximal ascending colon concerning for neoplastic process's no active GI bleeding, markedly distended gallbladder without abnormal wall thickening, no metastatic disease liver unremarkable, spleen unremarkable pancreas unremarkable Patient had 3 episodes of painless BRB in Feb, went to ER with CTA that showed the area in ascending colon, was on NSAIDS at that time, she is now off of this She has not seen any further  - schedule for EGD/colon with Dr. Rhea Belton on 05/06 Risk of bowel prep, conscious sedation, and EGD and colonoscopy were discussed.  Risks include but are not limited to dehydration, pain, bleeding, cardiopulmonary process, bowel perforation, or other possible adverse outcomes..  Treatment plan was discussed with patient, and agreed upon. 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 MELD 3.0: 9 at 03/01/2023  INR normal 02/28/2023 No ETOH in 2 years Autoimmune workup negative 08/2021 History of HCV treated digestive health, negative RNA quant 8/20 has hepatitis B antibody  needs hepatitis A vaccine patient Screening EGD 08/28/2021 no varices 02/28/2023 CT angio negative for [facility]  suggest repeat EGD with colon, will need HCC screening in in August 2025  GERD with history of grade C reflux esophagitis EGD 08/23/2021 Roux-en-Y gastrojejunostomy healthy anastomosis mild gastritis normal jejunum no specimens collected Recall endoscopy 08/2023  Screening colonoscopy 05/25/2021 diverticula, internal hemorrhoids small negative microscopic colitis recall 10 years, 05/2031   Patient Care Team: Ollen Bowl, MD as PCP - General (Internal Medicine)  HISTORY OF PRESENT ILLNESS: 63 y.o. female with a past medical history of gastric bypass in 2014, B12 deficiency, chronic multifactorial anemia, diverticulosis , eczema, possible alcoholic pancreatitis in 2021, carpal tunnel, smoldering multiple myeloma and others listed below presents for evaluation of anemia likely multifactorial scheduled for EGD: 05/07/2023 here for repeat CBC and prep instructions. Last seen in the office 07/20/2021 by Willette Cluster for exocrine pancreatic insufficiency and hepatic cirrhosis  Discussed the use of AI scribe software for clinical note transcription with the patient, who gave verbal consent to proceed.  History of Present Illness   Debra Jacobson is a 63 year old female with anemia and diverticulosis who presents with gastrointestinal bleeding. She was taken to the emergency department by her son during one episode of bleeding.  In February, she experienced three episodes of hematochezia, with one episode causing dizziness and hypotension, necessitating emergency department care. A CT scan identified a polyp in the ascending colon. She was using NSAIDs for arthritis pain,  which have since been discontinued.  She has a history of anemia due to multiple factors, including past bypass surgery, smoldering myeloma, and B12 deficiency. Hemoglobin levels have ranged from 6 to 11 g/dL. She receives iron infusions and takes oral B12 supplements. Procrit injections are administered when hemoglobin drops to 7 g/dL, with two recent doses.  A colonoscopy in May 2023 revealed diverticulosis. A recent CT angiography for fecal occult blood did not show active bleeding but confirmed the presence of a polyp.  She has severe osteoporosis and arthritis, previously managed with NSAIDs like ibuprofen, Mobic, and Robaxin, which have been stopped due to gastrointestinal bleeding risk. She uses prednisone for inflammation.  She has a history of alcohol use, which ceased two years ago, resulting in improved liver enzymes. She no longer experiences diarrhea and has regular, formed bowel movements.  No current abdominal pain, shortness of breath, or chest discomfort. Occasional leg swelling after long work hours. She felt faint and dizzy during the significant blood loss episode in February.     She  reports that she quit smoking about 31 years ago. Her smoking use included cigarettes. She has never used smokeless tobacco. She reports that she does not currently use alcohol. She reports that she does not use drugs.  RELEVANT GI HISTORY, IMAGING AND LABS: Results   LABS Hb: 8.3, 9.3, 8.7 g/dL Liver enzymes: normal  RADIOLOGY CT angio: Polyp in ascending colon (03/2023)  DIAGNOSTIC Colonoscopy: Good bowel prep, diverticulosis (05/2021)      CBC    Component Value Date/Time   WBC 6.9 04/05/2023 0746   WBC 7.5 03/06/2023 1437   RBC 2.92 (L) 04/05/2023 0746   HGB 8.7 (L) 04/05/2023 0746   HCT 29.2 (L) 04/05/2023 0746   HCT 26.5 (L) 02/13/2021 1258   PLT 225 04/05/2023 0746   MCV 100.0 04/05/2023 0746   MCH 29.8 04/05/2023 0746   MCHC 29.8 (L) 04/05/2023 0746   RDW 17.9 (H)  04/05/2023 0746   LYMPHSABS 0.9 04/05/2023 0746   MONOABS 0.4 04/05/2023 0746   EOSABS 0.1 04/05/2023 0746   BASOSABS 0.0 04/05/2023 0746   Recent Labs    02/28/23 1225 02/28/23 1232 02/28/23 1500 02/28/23 2218 03/01/23 0442 03/04/23 1000 03/06/23 1437 03/22/23 0808 03/29/23 0800 04/05/23 0746  HGB 8.3* 8.8* 8.0* 7.1* 7.3* 7.2* 7.9 Repeated and verified X2.* 7.7* 9.2* 8.7*    CMP     Component Value Date/Time   NA 137 04/05/2023 0746   K 3.8 04/05/2023 0746   CL 109 04/05/2023 0746   CO2 20 (L) 04/05/2023 0746   GLUCOSE 135 (H) 04/05/2023 0746   BUN 23 04/05/2023 0746   CREATININE 1.00 04/05/2023 0746   CREATININE 0.94 03/28/2021 1622   CALCIUM 8.7 (L) 04/05/2023 0746   PROT 7.5 04/05/2023 0746   ALBUMIN 3.8 04/05/2023 0746   AST 34 04/05/2023 0746   ALT 37 04/05/2023 0746   ALKPHOS 98 04/05/2023 0746   BILITOT 0.2 04/05/2023 0746   GFRNONAA >60 04/05/2023 0746   GFRAA >60 05/21/2019 0553   GFRAA >60 04/15/2019 1039      Latest Ref Rng & Units 04/05/2023    7:46 AM 03/29/2023    8:00 AM 03/22/2023    8:08 AM  Hepatic Function  Total Protein 6.5 - 8.1 g/dL 7.5  8.8  7.9   Albumin 3.5 - 5.0 g/dL 3.8  4.4  3.9   AST 15 - 41 U/L 34  36  26   ALT 0 -  44 U/L 37  40  28   Alk Phosphatase 38 - 126 U/L 98  114  107   Total Bilirubin 0.0 - 1.2 mg/dL 0.2  0.3  0.3       Latest Ref Rng & Units 08/21/2021    4:07 PM  Hepatitis C  HCV Quanitative Log log IU/mL <1.18     Current Medications:   Current Outpatient Medications (Endocrine & Metabolic):    predniSONE (DELTASONE) 10 MG tablet, Take 1 tablet (10 mg total) by mouth daily with breakfast.  Current Outpatient Medications (Cardiovascular):    furosemide (LASIX) 40 MG tablet, Take 40 mg by mouth as needed for edema.     Current Outpatient Medications (Other):    gabapentin (NEURONTIN) 100 MG capsule, TAKE 2 CAPSULES BY MOUTH DAILY IN MORNING AND 4 CAPSULES DAILY AT BEDTIME   methocarbamol (ROBAXIN) 500 MG  tablet, Take 500 mg by mouth 3 (three) times daily as needed for muscle spasms.   omeprazole (PRILOSEC) 40 MG capsule, TAKE 1 CAPSULE(40 MG) BY MOUTH DAILY (Patient taking differently: Take 40 mg by mouth daily.)  Medical History:  Past Medical History:  Diagnosis Date   Allergy    all rhinitis   Dependent edema    Dermatitis    GERD (gastroesophageal reflux disease)    Hypertension    Allergies: No Known Allergies   Surgical History:  She  has a past surgical history that includes Cesarean section and Tubal ligation. Family History:  Her family history includes Diabetes in her maternal grandmother; Hyperlipidemia in her mother; Non-Hodgkin's lymphoma in her father.  REVIEW OF SYSTEMS  : All other systems reviewed and negative except where noted in the History of Present Illness.  PHYSICAL EXAM: BP 120/78   Pulse 62   Ht 5' 7.5" (1.715 m)   Wt 184 lb (83.5 kg)   LMP 04/14/2012   BMI 28.39 kg/m  Physical Exam   GENERAL APPEARANCE: Well nourished, in no apparent distress. HEENT: No cervical lymphadenopathy, unremarkable thyroid, sclerae anicteric, conjunctiva pink. RESPIRATORY: Respiratory effort normal, breath sounds equal bilaterally without rales, rhonchi, or wheezing. Lungs clear to auscultation bilaterally. CARDIO: Regular rate and rhythm with no murmurs, rubs, or gallops, peripheral pulses intact. ABDOMEN: Soft, non-distended, active bowel sounds in all four quadrants, no tenderness to palpation, no rebound, no mass appreciated. RECTAL: Declines. MUSCULOSKELETAL: Full range of motion, normal gait, without edema. SKIN: Dry, intact without rashes or lesions. No jaundice. NEURO: Alert, oriented, no focal deficits. PSYCH: Cooperative, normal mood and affect.      Doree Albee, PA-C 3:35 PM

## 2023-04-05 NOTE — Patient Instructions (Signed)

## 2023-04-05 NOTE — Progress Notes (Signed)
 Patient states she took Tylenol and Claritin prior to appointment today.  Pt declined 30 minute observation following Venofer infusion. Pt tolerated Tx well w/out incident. VSS at discharge.  Ambulatory to lobby.

## 2023-04-05 NOTE — Patient Instructions (Addendum)
 _______________________________________________________  If your blood pressure at your visit was 140/90 or greater, please contact your primary care physician to follow up on this.  _______________________________________________________  If you are age 64 or older, your body mass index should be between 23-30. Your Body mass index is 28.39 kg/m. If this is out of the aforementioned range listed, please consider follow up with your Primary Care Provider.  If you are age 25 or younger, your body mass index should be between 19-25. Your Body mass index is 28.39 kg/m. If this is out of the aformentioned range listed, please consider follow up with your Primary Care Provider.   ________________________________________________________  The [facility] providers would like to encourage you to use [facility] to communicate with providers for non-urgent requests or questions.  Due to long hold times on the telephone, sending your provider a message by [facility] may be a faster and more efficient way to get a response.  Please allow 48 business hours for a response.  Please remember that this is for non-urgent requests.  _______________________________________________________   May 4 or May 5th, need to come to the lab for a STAT CBC to assure stability of HGB Orders in  Your provider has requested that you go to the basement level for lab work before leaving today. Press "B" on the elevator. The lab is located at the first door on the left as you exit the elevator.  Advised to go to the ER if there is any severe weakness, severe abdominal pain, vomit blood, dark red blood in your bowel movement, shortness of breath or chest pain.   Diverticulosis Diverticulosis is a condition that develops when small pouches (diverticula) form in the wall of the large intestine (colon). The colon is where water is absorbed and stool (feces) is formed. The pouches form when the inside layer of the colon pushes through  weak spots in the outer layers of the colon. You may have a few pouches or many of them. The pouches usually do not cause problems unless they become inflamed or infected. When this happens, the condition is called diverticulitis- this is left lower quadrant pain, diarrhea, fever, chills, nausea or vomiting.  If this occurs please call the office or go to the hospital. Sometimes these patches without inflammation can also have painless bleeding associated with them, if this happens please call the office or go to the hospital. Preventing constipation and increasing fiber can help reduce diverticula and prevent complications. Even if you feel you have a high-fiber diet, suggest getting on Benefiber or Cirtracel 2 times daily.  You have been scheduled for an endoscopy and colonoscopy. Please follow the written instructions given to you at your visit today.  If you use inhalers (even only as needed), please bring them with you on the day of your procedure.  DO NOT TAKE 7 DAYS PRIOR TO TEST- Trulicity (dulaglutide) Ozempic, Wegovy (semaglutide) Mounjaro (tirzepatide) Bydureon Bcise (exanatide extended release)  DO NOT TAKE 1 DAY PRIOR TO YOUR TEST Rybelsus (semaglutide) Adlyxin (lixisenatide) Victoza (liraglutide) Byetta (exanatide) ___________________________________________________________________________  Debra Jacobson will receive your bowel preparation through [facility], which ensures the lowest copay and home delivery, with outreach via text or call from an [phone] number. Please respond promptly to avoid rescheduling of your procedure. If you are interested in alternative options or have any questions regarding your prep, please contact them at [phone] ____________________________________________________________________________  Your Provider Has Sent Your Bowel Prep Regimen To [facility]   [facility] will contact you to verify your  information and collect your copay, if applicable. Enjoy the  comfort of your home while your prescription is mailed to you, FREE of any shipping charges.   [facility] accepts all major insurance benefits and applies discounts & coupons.  Have additional questions?   Chat: [email or web address] Call: [phone] Email: [email or web address] NCPDP: [id number]  How will [facility] contact you?  With a Welcome phone call,  a Welcome text and a checkout link in text form.  Texts you receive from [phone] Are NOT Spam.  *To set up delivery, you must complete the checkout process via link or speak to one of the patient care representatives. If [facility] is unable to reach you, your prescription may be delayed.  To avoid long hold times on the phone, you may also utilize the secure chat feature on the [facility] website to request that they call you back for transaction completion or to expedite your concerns.  Due to recent changes in healthcare laws, you may see the results of your imaging and laboratory studies on MyChart before your provider has had a chance to review them.  We understand that in some cases there may be results that are confusing or concerning to you. Not all laboratory results come back in the same time frame and the provider may be waiting for multiple results in order to interpret others.  Please give [place] 48 hours in order for your provider to thoroughly review all the results before contacting the office for clarification of your results.

## 2023-04-08 ENCOUNTER — Other Ambulatory Visit: Payer: Self-pay | Admitting: Orthopedic Surgery

## 2023-04-08 ENCOUNTER — Telehealth: Payer: Self-pay | Admitting: Orthopedic Surgery

## 2023-04-08 MED ORDER — METHOCARBAMOL 500 MG PO TABS: 500.0000 mg | ORAL_TABLET | Freq: Three times a day (TID) | ORAL | 0 refills | Status: DC | PRN

## 2023-04-08 NOTE — Telephone Encounter (Signed)
 Please see message below. Pt was in office 03/06/2023 shoulder pain. Had injection. You have not give rx for Robaxin before but pt is requesting a refill from you. Please advise.

## 2023-04-08 NOTE — Telephone Encounter (Signed)
 Rx refill Methocarbamol    [facility] on [address]

## 2023-04-09 ENCOUNTER — Other Ambulatory Visit: Payer: Self-pay

## 2023-04-09 DIAGNOSIS — D472 Monoclonal gammopathy: Secondary | ICD-10-CM

## 2023-04-09 DIAGNOSIS — D649 Anemia, unspecified: Secondary | ICD-10-CM

## 2023-04-10 ENCOUNTER — Inpatient Hospital Stay (HOSPITAL_BASED_OUTPATIENT_CLINIC_OR_DEPARTMENT_OTHER): Admitting: Hematology

## 2023-04-10 ENCOUNTER — Inpatient Hospital Stay

## 2023-04-10 VITALS — BP 123/79 | HR 63 | Temp 97.4°F | Resp 18 | Ht 67.5 in | Wt 185.7 lb

## 2023-04-10 DIAGNOSIS — D508 Other iron deficiency anemias: Secondary | ICD-10-CM

## 2023-04-10 DIAGNOSIS — D649 Anemia, unspecified: Secondary | ICD-10-CM

## 2023-04-10 DIAGNOSIS — D472 Monoclonal gammopathy: Secondary | ICD-10-CM | POA: Diagnosis not present

## 2023-04-10 DIAGNOSIS — Z9884 Bariatric surgery status: Secondary | ICD-10-CM | POA: Diagnosis not present

## 2023-04-10 DIAGNOSIS — E538 Deficiency of other specified B group vitamins: Secondary | ICD-10-CM | POA: Diagnosis not present

## 2023-04-10 DIAGNOSIS — D509 Iron deficiency anemia, unspecified: Secondary | ICD-10-CM | POA: Diagnosis not present

## 2023-04-10 LAB — CMP (CANCER CENTER ONLY)
ALT: 31 U/L (ref 0–44)
AST: 28 U/L (ref 15–41)
Albumin: 3.8 g/dL (ref 3.5–5.0)
Alkaline Phosphatase: 92 U/L (ref 38–126)
Anion gap: 4 — ABNORMAL LOW (ref 5–15)
BUN: 24 mg/dL — ABNORMAL HIGH (ref 8–23)
CO2: 27 mmol/L (ref 22–32)
Calcium: 9.1 mg/dL (ref 8.9–10.3)
Chloride: 106 mmol/L (ref 98–111)
Creatinine: 1.2 mg/dL — ABNORMAL HIGH (ref 0.44–1.00)
GFR, Estimated: 51 mL/min — ABNORMAL LOW (ref >60)
Glucose, Bld: 106 mg/dL — ABNORMAL HIGH (ref 70–99)
Potassium: 3.7 mmol/L (ref 3.5–5.1)
Sodium: 137 mmol/L (ref 135–145)
Total Bilirubin: 0.2 mg/dL (ref 0.0–1.2)
Total Protein: 7.5 g/dL (ref 6.5–8.1)

## 2023-04-10 LAB — CBC WITH DIFFERENTIAL (CANCER CENTER ONLY)
Abs Immature Granulocytes: 0.03 10*3/uL (ref 0.00–0.07)
Basophils Absolute: 0 10*3/uL (ref 0.0–0.1)
Basophils Relative: 0 %
Eosinophils Absolute: 0.1 10*3/uL (ref 0.0–0.5)
Eosinophils Relative: 2 %
HCT: 30.3 % — ABNORMAL LOW (ref 36.0–46.0)
Hemoglobin: 9.2 g/dL — ABNORMAL LOW (ref 12.0–15.0)
Immature Granulocytes: 0 %
Lymphocytes Relative: 28 %
Lymphs Abs: 2.1 10*3/uL (ref 0.7–4.0)
MCH: 30.1 pg (ref 26.0–34.0)
MCHC: 30.4 g/dL (ref 30.0–36.0)
MCV: 99 fL (ref 80.0–100.0)
Monocytes Absolute: 0.5 10*3/uL (ref 0.1–1.0)
Monocytes Relative: 7 %
Neutro Abs: 4.6 10*3/uL (ref 1.7–7.7)
Neutrophils Relative %: 63 %
Platelet Count: 228 10*3/uL (ref 150–400)
RBC: 3.06 MIL/uL — ABNORMAL LOW (ref 3.87–5.11)
RDW: 18.8 % — ABNORMAL HIGH (ref 11.5–15.5)
WBC Count: 7.3 10*3/uL (ref 4.0–10.5)
nRBC: 0 % (ref 0.0–0.2)

## 2023-04-10 LAB — IRON AND IRON BINDING CAPACITY (CC-WL,HP ONLY)
Iron: 66 ug/dL (ref 28–170)
Saturation Ratios: 19 % (ref 10.4–31.8)
TIBC: 351 ug/dL (ref 250–450)
UIBC: 285 ug/dL (ref 148–442)

## 2023-04-10 LAB — FERRITIN: Ferritin: 155 ng/mL (ref 11–307)

## 2023-04-10 MED ORDER — PROCRIT 10000 UNIT/ML IJ SOLN: 10000.0000 [IU] | INTRAMUSCULAR | 0 refills | Status: DC

## 2023-04-10 MED ORDER — EPOETIN ALFA 10000 UNIT/ML IJ SOLN
10000.0000 [IU] | Freq: Once | INTRAMUSCULAR | Status: AC
  Administered 2023-04-10: 10000 [IU] via INTRAVENOUS
  Filled 2023-04-10: qty 1

## 2023-04-10 NOTE — Progress Notes (Signed)
 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 female who has been referred to us  by Dr Leighton Punches for evaluation and management of anemia. The pt reports that she is doing well overall.  The pt reports that she was first given the diagnoses of anemia about 10 years ago. Pt is currently taking 1 iron pill every other day. Pt had a gastric bypass surgery in 2014. She initially weighed about 400 lbs and is currently 164 lbs. She has been experiencing a severe lack of appetite and weight loss. When she does attempt to eat she can only take a couple of bites and then has a bowel movement soon after that. She also has abdominal pain after she eats. Pt has seasonal allergies and eczema, for which she has been taking Dupixent for 3 months. Pt has never had a skin biopsy to confirm her eczema diagnosis. It was dormant for about 10 years and came back 2 years ago after an interaction with a cat and has persisted. Pt also takes Vistaril to help with the itch. Pt has never had an allergy test or immune therapy testing. Pt took steroid shots for her carpal tunnel. There were pre-cancerous cells found in pt's pap smear and her Gynecologist is planning on doing a resection. Pt was experiencing black stools a couple of months ago, that were not painful and did not speak with her PCP about it. She had a colonoscopy in 2014 and has never had an endoscopy. Pt believes her ankle swelling is from work and wears compression socks which helps.   Most recent lab results (08/12/2018) of CBC is as follows: WBC at 3.8K, RBC at 2.49, Hgb at 7.8, HCT at 24.3, MCV at 98, MCH at 31.3, MCHC at 32.1, RDW at 15.9, Platelets at 182K.  08/12/2018 Transferrin is  286 08/12/2018 Ferritin is 30  08/12/2018 Iron and TIBC shows Iron Bind, Cap, (TIBC) at 349, UIBC at 303, Iron at 46, Iron Sat at 13.   On review of systems, pt reports pain in her left knee, weight loss, fatigue, enlarged cervical lymph nodes, black/bloody stools, abdominal pain and denies vaginal bleeds, nose bleeds gum bleeds, chest pain, SOB, fevers, chills and any other symptoms.   On PMHx the pt reports gastric bypass surgery, two cesarean sections, tubal ligation.  On Social Hx the pt reports that she has quit smoking, no alcohol use.   INTERVAL HISTORY:  Debra Jacobson is a 63 y.o. female who is here for continued evaluation and management of smoldering myeloma and multifactorial anemia.  I last connected with patient via phone visit on 03/11/2023 and she complained of chronic joint pain, lower GI bleeding a month prior requiring hospitalization, and fatigue.   Patient saw gastroenterology for colonic mass on 04/05/2023.   Today, she reports that she has been receiving IV iron infusions and will receive her fourth infusion this Friday, 04/12/2023. Patient denies any visible bleeding since receiving iron infusions. She continues to receive EPO shots once daily. She last received Retacrit 2 weeks ago.  She denies any recent changes in bowel habits or abdominal pain. She denies any black stools or blood in the stools since her first iron infusion. Patient has not had recent Cologuard testing.  Patient has EGD/colon scheduled with Dr. Bridgett Camps on 05/07/2023.  Patient reports that she has not had any alcohol consumption in the last 2 years.   She is taking vitamin B complex. Patient takes omeprazole twice daily   Patient reports that she has gained 20 pounds over the course of a year. Her current weight is 185 pounds.   She complains of leg pain, which she attributes to working 16-hour shifts on weekends. Patient does endorse mild leg swelling after working long shifts and does generally use  compression socks.   She reports that her Gabapentin has been decreased from 300 MG to 100 MG. Patient takes 200 MG in the mornings.   Patient reports that she does not take ibuprofen. She uses Tylenol for pain management.   Patient reports that she recently started Robaxin 500 MG for muscle spasms.   She reports having less stress levels recently.   MEDICAL HISTORY:  Past Medical History:  Diagnosis Date   Allergy    all rhinitis   Dependent edema    Dermatitis    GERD (gastroesophageal reflux disease)    Hypertension     SURGICAL HISTORY: Past Surgical History:  Procedure Laterality Date   CESAREAN SECTION     x2   TUBAL LIGATION      SOCIAL HISTORY: Social History   Socioeconomic History   Marital status: Legally Separated    Spouse name: Not on file   Number of children: 3   Years of education: Not on file   Highest education level: Not on file  Occupational History   Occupation: Travel nurse  Tobacco Use   Smoking status: Former    Current packs/day: 0.00    Types: Cigarettes    Quit date: 08/29/1991    Years since quitting: 31.6   Smokeless tobacco: Never  Vaping Use   Vaping status: Never Used  Substance and Sexual Activity   Alcohol use: Not Currently    Comment: Patient drinks 2 40oz beers multiple days a week   Drug use: No   Sexual activity: Not on file  Other Topics Concern   Not on file  Social History Narrative   Not on file   Social Drivers of Health   Financial Resource Strain: Not on file  Food Insecurity: No Food Insecurity (03/01/2023)   Hunger Vital Sign    Worried About Running Out of Food in the Last Year: Never true    Ran Out of Food in the Last Year: Never true  Transportation Needs: Unknown (03/01/2023)   PRAPARE - Transportation    Lack of Transportation (Medical): No    Lack of Transportation (Non-Medical): Patient declined  Physical Activity: Not on file  Stress: Not on file  Social Connections: Unknown (05/11/2021)    Received from [facility]   Social Network    Social Network: Not on file  Intimate Partner Violence: Not At Risk (03/01/2023)   Humiliation, Afraid, Rape, and Kick questionnaire    Fear of Current or Ex-Partner: No    Emotionally Abused: No    Physically Abused: No    Sexually Abused: No    FAMILY HISTORY: Family History  Problem Relation Age of Onset   Hyperlipidemia Mother    Non-Hodgkin's lymphoma Father    Diabetes Maternal Grandmother    Colon cancer Neg Hx    Stomach cancer Neg Hx    Esophageal cancer Neg Hx    Colon polyps Neg Hx    Pancreatic cancer Neg  Hx    Breast cancer Neg Hx     ALLERGIES:  has no known allergies.  MEDICATIONS:  Current Outpatient Medications  Medication Sig Dispense Refill   furosemide (LASIX) 40 MG tablet Take 40 mg by mouth as needed for edema.     gabapentin (NEURONTIN) 100 MG capsule TAKE 2 CAPSULES BY MOUTH DAILY IN MORNING AND 4 CAPSULES DAILY AT BEDTIME 180 capsule 2   methocarbamol (ROBAXIN) 500 MG tablet Take 1 tablet (500 mg total) by mouth every 8 (eight) hours as needed for muscle spasms. 30 tablet 0   omeprazole (PRILOSEC) 40 MG capsule TAKE 1 CAPSULE(40 MG) BY MOUTH DAILY (Patient taking differently: Take 40 mg by mouth daily.) 30 capsule 5   predniSONE (DELTASONE) 10 MG tablet TAKE 1 TABLET(10 MG) BY MOUTH DAILY WITH BREAKFAST 30 tablet 0   No current facility-administered medications for this visit.    REVIEW OF SYSTEMS:    10 Point review of Systems was done is negative except as noted above.   PHYSICAL EXAMINATION:  .BP 123/79 (BP Location: Left Arm, Patient Position: Sitting)   Pulse 63   Temp (!) 97.4 F (36.3 C) (Tympanic)   Resp 18   Ht 5' 7.5" (1.715 m)   Wt 185 lb 11.2 oz (84.2 kg)   LMP 04/14/2012   SpO2 100%   BMI 28.66 kg/m   GENERAL:alert, in no acute distress and comfortable SKIN: no acute rashes, no significant lesions EYES: conjunctiva are pink and non-injected, sclera  anicteric OROPHARYNX: MMM, no exudates, no oropharyngeal erythema or ulceration NECK: supple, no JVD LYMPH:  no palpable lymphadenopathy in the cervical, axillary or inguinal regions LUNGS: clear to auscultation b/l with normal respiratory effort HEART: regular rate & rhythm ABDOMEN:  normoactive bowel sounds , non tender, not distended. Extremity: no pedal edema PSYCH: alert & oriented x 3 with fluent speech NEURO: no focal motor/sensory deficits   LABORATORY DATA:  I have reviewed the data as listed  .    Latest Ref Rng & Units 04/10/2023    1:31 PM 04/05/2023    7:46 AM 03/29/2023    8:00 AM  CBC  WBC 4.0 - 10.5 K/uL 7.3  6.9  10.9   Hemoglobin 12.0 - 15.0 g/dL 9.2  8.7  9.2   Hematocrit 36.0 - 46.0 % 30.3  29.2  30.6   Platelets 150 - 400 K/uL 228  225  240    . CBC    Component Value Date/Time   WBC 7.3 04/10/2023 1331   WBC 7.5 03/06/2023 1437   RBC 3.06 (L) 04/10/2023 1331   HGB 9.2 (L) 04/10/2023 1331   HCT 30.3 (L) 04/10/2023 1331   HCT 26.5 (L) 02/13/2021 1258   PLT 228 04/10/2023 1331   MCV 99.0 04/10/2023 1331   MCH 30.1 04/10/2023 1331   MCHC 30.4 04/10/2023 1331   RDW 18.8 (H) 04/10/2023 1331   LYMPHSABS 2.1 04/10/2023 1331   MONOABS 0.5 04/10/2023 1331   EOSABS 0.1 04/10/2023 1331   BASOSABS 0.0 04/10/2023 1331    .    Latest Ref Rng & Units 04/10/2023    1:31 PM 04/05/2023    7:46 AM 03/29/2023    8:00 AM  CMP  Glucose 70 - 99 mg/dL 829  562  99   BUN 8 - 23 mg/dL 24  23  28    Creatinine 0.44 - 1.00 mg/dL 1.30  8.65  7.84   Sodium 135 - 145 mmol/L 137  137  136  Potassium 3.5 - 5.1 mmol/L 3.7  3.8  4.5   Chloride 98 - 111 mmol/L 106  109  105   CO2 22 - 32 mmol/L 27  20  24    Calcium 8.9 - 10.3 mg/dL 9.1  8.7  9.4   Total Protein 6.5 - 8.1 g/dL 7.5  7.5  8.8   Total Bilirubin 0.0 - 1.2 mg/dL 0.2  0.2  0.3   Alkaline Phos 38 - 126 U/L 92  98  114   AST 15 - 41 U/L 28  34  36   ALT 0 - 44 U/L 31  37  40    . 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  The plasma  cells display kappa light chain restriction consistent with plasma cell  neoplasm.  The background shows trilineage hematopoiesis with generally  nonspecific changes possibly related to history of liver disease,  alcohol consumption, etc.  Nonetheless, correlation with cytogenetic and  FISH studies is recommended."  RADIOGRAPHIC STUDIES: I have personally reviewed the radiological images as listed and agreed with the findings in the report. No results found.   ASSESSMENT & PLAN:   63 y.o. female with:  1) significant macrocytic anemia likely multifactorial but significant concern for liver cirrhosis and alcohol abuse.  Other factors Vitamin B12 deficiency and other potential nutritional deficiencyes caused by Gastric bypass surgery . Also had dyserythropoiesis on BM Bx.  Currently active significant alcohol abuse and liver disease are also significant contributing factors. 2) Iron deficiency- GI blood loss vs poor absorption related to Gastric bypass surgery. 09/17/2018 Occult blood card to lab is "POSITIVE" x2.  3) Newly Diagnosed IgG Kappa Paraproteinemia, concern  for smoldering myeloma vs multiple myeloma  09/01/2018 MMP shows "IgG monoclonal protein with kappa light chain specificity" 09/01/2018 Kappa free light chain at 93.4 09/22/2018 FISH Analysis shows "abnormal results - t(14,16) DETECTED" 09/22/2018 Bone Marrow report shows "Dyserythropoiesis. 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  Recent admission with confusion and hepatic encephalopathy. 6) noncompliance with medical follow-up 7) Leg swelling -Down with diuresis. -US  results not suggestive of DVT  PLAN:  -Discussed lab results on 04/10/23 in detail with patient. CBC showed WBC of 7.3K, hemoglobin of 9.2, and platelets of 228K. -Discussed concern of mass in the colon based on recent CT angio scan Patient has EGD/colon scheduled with Dr. Bridgett Camps on 05/07/2023. -recommend patient to have colonoscopy sooner due to concerns for colonic mass -discussed that there are multiple potential causes of anemia, such as inflammation, cirrhosis, or tumor -  discussed that if the bleeding source in the GI tract is a tumor, we would rather address the tumor and hold off on growth factor shots like Erythropoietin shots.  -discussed that if her anemia is due to iron deficiency, there would be no indication to continue EPO injections -discussed that EPO injections can cause tumor growth -Discussed that if there is a role for surgery to remove tumor, there is a need for hgb>10 for surgery. -we may need to push retacrit to manage hgb to allow for safe procedure -will hold Retacrit with hgb>10 -continue Procrit until further evaluation by endoscopy/colonoscopy -Continue Procrit every 2 weeks for hgb<10 -IV Venofer 300mg  monthly after completion of initial 4 doses -Patient is Fredericka James witness and she will not receive PRBC transfusion.  -Answered  all of patient's questions.  FOLLOW-UP: RTC with Dr Salomon Cree with labs in 1 month IV Venofer 300mg  monthly after completion of initial 4 doses Continue Procrit every 2 weeks for hgb<10  The total time spent in the appointment was 30 minutes* .  All of the patient's questions were answered with apparent satisfaction. The patient knows to call the clinic with any problems, questions or concerns.   Jacquelyn Matt MD MS AAHIVMS [facility] Hematology/Oncology Physician [facility]  .*Total Encounter Time as defined by the Centers for Medicare and Medicaid Services includes, in addition to the face-to-face time of a patient visit (documented in the note above) non-face-to-face time: obtaining and reviewing outside history, ordering and reviewing medications, tests or procedures, care coordination (communications with other health care professionals or caregivers) and documentation in the medical record.    I,Mitra Faeizi,acting as a Neurosurgeon for Jacquelyn Matt, MD.,have documented all relevant documentation on the behalf of Jacquelyn Matt, MD,as directed by  Jacquelyn Matt, MD while in the presence of Jacquelyn Matt, MD.  .I have reviewed the above documentation for accuracy and completeness, and I agree with the above. .Ausencio Vaden Kishore Kinzlee Selvy MD

## 2023-04-10 NOTE — Progress Notes (Deleted)
 Hgb is within the set parameters, Per the Dr. Rx was sent to pt. [facility] for self administered. Pt acknowledge understanding. Looks like there was an rx for it sent to [facility]. Per communication with pharmacist

## 2023-04-10 NOTE — Progress Notes (Signed)
 Pt to receive Procrit 10,000 units today with Hgb 9.2 per Dr. Candise Che. Informed RN that order in tx plan needs to be changed to include during visit Procrit vs outpatient rx Procrit.  Drusilla Kanner, PharmD, MBA

## 2023-04-12 ENCOUNTER — Other Ambulatory Visit

## 2023-04-12 ENCOUNTER — Inpatient Hospital Stay

## 2023-04-12 VITALS — BP 127/85 | HR 65 | Temp 97.8°F | Resp 18

## 2023-04-12 DIAGNOSIS — D508 Other iron deficiency anemias: Secondary | ICD-10-CM

## 2023-04-12 DIAGNOSIS — D509 Iron deficiency anemia, unspecified: Secondary | ICD-10-CM | POA: Diagnosis not present

## 2023-04-12 DIAGNOSIS — Z9884 Bariatric surgery status: Secondary | ICD-10-CM

## 2023-04-12 MED ORDER — SODIUM CHLORIDE 0.9 % IV SOLN
300.0000 mg | Freq: Once | INTRAVENOUS | Status: AC
  Administered 2023-04-12: 300 mg via INTRAVENOUS
  Filled 2023-04-12: qty 300

## 2023-04-12 MED ORDER — SODIUM CHLORIDE 0.9 % IV SOLN: INTRAVENOUS | Status: DC

## 2023-04-12 MED ORDER — IRON SUCROSE 300 MG IVPB - SIMPLE MED: 300.0000 mg | Freq: Once | Status: DC

## 2023-04-12 NOTE — Progress Notes (Signed)
Patient declined post iron infusion observation.  Tolerated treatment well without incident.  VSS at discharge.  Ambulated to lobby.

## 2023-04-12 NOTE — Progress Notes (Signed)
 Patient took Tylenol and Claritin prior to arrival.

## 2023-04-12 NOTE — Addendum Note (Signed)
 Addended by: Drusilla Kanner on: 04/12/2023 08:51 AM   Modules accepted: Orders

## 2023-04-12 NOTE — Patient Instructions (Signed)

## 2023-04-17 ENCOUNTER — Encounter: Payer: Self-pay | Admitting: Hematology

## 2023-04-23 ENCOUNTER — Telehealth: Payer: Self-pay | Admitting: Internal Medicine

## 2023-04-23 DIAGNOSIS — K2970 Gastritis, unspecified, without bleeding: Secondary | ICD-10-CM

## 2023-04-23 MED ORDER — OMEPRAZOLE 40 MG PO CPDR: 40.0000 mg | DELAYED_RELEASE_CAPSULE | Freq: Every day | ORAL | 5 refills | Status: DC

## 2023-04-23 NOTE — Telephone Encounter (Signed)
 Patient called and stated that she is needing a refill on her medication Omeprazole . Please advise.

## 2023-04-23 NOTE — Telephone Encounter (Signed)
 Prescription sent to patient's pharmacy.

## 2023-04-28 ENCOUNTER — Other Ambulatory Visit: Payer: Self-pay | Admitting: Orthopedic Surgery

## 2023-05-03 NOTE — Progress Notes (Signed)
 Addendum: Reviewed and agree with assessment and management plan. Asha Grumbine, Carie Caddy, MD

## 2023-05-03 NOTE — Addendum Note (Signed)
 Addended by: Nannette Babe on: 05/03/2023 09:59 AM   Modules accepted: Level of Service

## 2023-05-07 ENCOUNTER — Encounter: Payer: Self-pay | Admitting: Internal Medicine

## 2023-05-07 ENCOUNTER — Ambulatory Visit: Admitting: Internal Medicine

## 2023-05-07 ENCOUNTER — Encounter: Payer: Self-pay | Admitting: Hematology

## 2023-05-07 VITALS — BP 125/76 | HR 63 | Temp 97.6°F | Resp 12 | Ht 67.5 in | Wt 184.0 lb

## 2023-05-07 DIAGNOSIS — R195 Other fecal abnormalities: Secondary | ICD-10-CM | POA: Diagnosis not present

## 2023-05-07 DIAGNOSIS — K449 Diaphragmatic hernia without obstruction or gangrene: Secondary | ICD-10-CM

## 2023-05-07 DIAGNOSIS — Z980 Intestinal bypass and anastomosis status: Secondary | ICD-10-CM | POA: Diagnosis not present

## 2023-05-07 DIAGNOSIS — K2289 Other specified disease of esophagus: Secondary | ICD-10-CM | POA: Diagnosis not present

## 2023-05-07 DIAGNOSIS — R933 Abnormal findings on diagnostic imaging of other parts of digestive tract: Secondary | ICD-10-CM

## 2023-05-07 DIAGNOSIS — K7460 Unspecified cirrhosis of liver: Secondary | ICD-10-CM | POA: Diagnosis not present

## 2023-05-07 DIAGNOSIS — K5730 Diverticulosis of large intestine without perforation or abscess without bleeding: Secondary | ICD-10-CM

## 2023-05-07 DIAGNOSIS — D509 Iron deficiency anemia, unspecified: Secondary | ICD-10-CM

## 2023-05-07 DIAGNOSIS — K648 Other hemorrhoids: Secondary | ICD-10-CM | POA: Diagnosis not present

## 2023-05-07 MED ORDER — SODIUM CHLORIDE 0.9 % IV SOLN: 500.0000 mL | Freq: Once | INTRAVENOUS | Status: DC

## 2023-05-07 NOTE — Progress Notes (Signed)
 GASTROENTEROLOGY PROCEDURE H&P NOTE   Primary Care Physician: Elester Grim, MD    Reason for Procedure:  Iron  deficiency anemia, heme positive stool, abnormal abdominal CT scan and cirrhosis  Plan:    Egd/colon  Patient is appropriate for endoscopic procedure(s) in the ambulatory ([facility]) setting.  The nature of the procedure, as well as the risks, benefits, and alternatives were carefully and thoroughly reviewed with the patient. Ample time for discussion and questions allowed. The patient understood, was satisfied, and agreed to proceed.     HPI: Debra Jacobson is a 63 y.o. female who presents for egd/colon.  Medical history as below.  Tolerated the prep.  No recent chest pain or shortness of breath.  No abdominal pain today.  Past Medical History:  Diagnosis Date   Allergy    all rhinitis   Anemia    Dependent edema    Dermatitis    GERD (gastroesophageal reflux disease)    Hypertension     Past Surgical History:  Procedure Laterality Date   CESAREAN SECTION     x2   COLONOSCOPY  05/25/2021   GASTRIC BYPASS  2013   TUBAL LIGATION      Prior to Admission medications   Medication Sig Start Date End Date Taking? Authorizing Provider  acetaminophen  (TYLENOL ) 500 MG tablet Take 500 mg by mouth every 6 (six) hours as needed.   Yes [provider]  Cholecalciferol (VITAMIN D3) 125 MCG (5000 UT) TABS Take by mouth.   Yes [provider]  gabapentin  (NEURONTIN ) 100 MG capsule TAKE 2 CAPSULES BY MOUTH DAILY IN MORNING AND 4 CAPSULES DAILY AT BEDTIME 03/26/23  Yes Kale, Gautam Kishore, MD  methocarbamol  (ROBAXIN ) 500 MG tablet Take 1 tablet (500 mg total) by mouth every 8 (eight) hours as needed for muscle spasms. 04/08/23  Yes Timothy Ford, MD  Omega-3 Fatty Acids (FISH OIL OMEGA-3 PO) Take by mouth. Omega xl   Yes [provider]  omeprazole  (PRILOSEC) 40 MG capsule Take 1 capsule (40 mg total) by mouth daily. 04/23/23  Yes Diarra Kos, Amber Bail, MD   predniSONE  (DELTASONE ) 10 MG tablet TAKE 1 TABLET(10 MG) BY MOUTH DAILY WITH BREAKFAST 04/29/23  Yes Timothy Ford, MD  epoetin  alfa (PROCRIT ) 10000 UNIT/ML injection Inject 1 mL (10,000 Units total) into the skin once a week. 04/10/23   Frankie Israel, MD  furosemide (LASIX) 40 MG tablet Take 40 mg by mouth as needed for edema.    [provider]    Current Outpatient Medications  Medication Sig Dispense Refill   acetaminophen  (TYLENOL ) 500 MG tablet Take 500 mg by mouth every 6 (six) hours as needed.     Cholecalciferol (VITAMIN D3) 125 MCG (5000 UT) TABS Take by mouth.     gabapentin  (NEURONTIN ) 100 MG capsule TAKE 2 CAPSULES BY MOUTH DAILY IN MORNING AND 4 CAPSULES DAILY AT BEDTIME 180 capsule 2   methocarbamol  (ROBAXIN ) 500 MG tablet Take 1 tablet (500 mg total) by mouth every 8 (eight) hours as needed for muscle spasms. 30 tablet 0   Omega-3 Fatty Acids (FISH OIL OMEGA-3 PO) Take by mouth. Omega xl     omeprazole  (PRILOSEC) 40 MG capsule Take 1 capsule (40 mg total) by mouth daily. 30 capsule 5   predniSONE  (DELTASONE ) 10 MG tablet TAKE 1 TABLET(10 MG) BY MOUTH DAILY WITH BREAKFAST 30 tablet 0   epoetin  alfa (PROCRIT ) 10000 UNIT/ML injection Inject 1 mL (10,000 Units total) into the skin once  a week. 1 mL 0   furosemide (LASIX) 40 MG tablet Take 40 mg by mouth as needed for edema.     Current Facility-Administered Medications  Medication Dose Route Frequency Provider Last Rate Last Admin   0.9 %  sodium chloride  infusion  500 mL Intravenous Once Rubert Frediani, Amber Bail, MD        Allergies as of 05/07/2023   (No Known Allergies)    Family History  Problem Relation Age of Onset   Hyperlipidemia Mother    Non-Hodgkin's lymphoma Father    Diabetes Maternal Grandmother    Colon cancer Neg Hx    Stomach cancer Neg Hx    Esophageal cancer Neg Hx    Colon polyps Neg Hx    Pancreatic cancer Neg Hx    Breast cancer Neg Hx     Social History   Socioeconomic History    Marital status: Legally Separated    Spouse name: Not on file   Number of children: 3   Years of education: Not on file   Highest education level: Not on file  Occupational History   Occupation: Travel nurse  Tobacco Use   Smoking status: Former    Current packs/day: 0.00    Types: Cigarettes    Quit date: 08/29/1991    Years since quitting: 31.7   Smokeless tobacco: Never  Vaping Use   Vaping status: Never Used  Substance and Sexual Activity   Alcohol use: Not Currently    Comment: Patient drinks 2 40oz beers multiple days a week   Drug use: No   Sexual activity: Not on file  Other Topics Concern   Not on file  Social History Narrative   Not on file   Social Drivers of Health   Financial Resource Strain: Not on file  Food Insecurity: No Food Insecurity (03/01/2023)   Hunger Vital Sign    Worried About Running Out of Food in the Last Year: Never true    Ran Out of Food in the Last Year: Never true  Transportation Needs: Unknown (03/01/2023)   PRAPARE - Transportation    Lack of Transportation (Medical): No    Lack of Transportation (Non-Medical): Patient declined  Physical Activity: Not on file  Stress: Not on file  Social Connections: Unknown (05/11/2021)   Received from [facility]   Social Network    Social Network: Not on file  Intimate Partner Violence: Not At Risk (03/01/2023)   Humiliation, Afraid, Rape, and Kick questionnaire    Fear of Current or Ex-Partner: No    Emotionally Abused: No    Physically Abused: No    Sexually Abused: No    Physical Exam: Vital signs in last 24 hours: @BP  118/76   Pulse 76   Temp 97.6 F (36.4 C) (Temporal)   Ht 5' 7.5" (1.715 m)   Wt 184 lb (83.5 kg)   LMP 04/14/2012   SpO2 98%   BMI 28.39 kg/m  GEN: NAD EYE: Sclerae anicteric ENT: MMM CV: Non-tachycardic Pulm: CTA b/l GI: Soft, NT/ND NEURO:  Alert & Oriented x 3   Laurell Pond, MD  Gastroenterology  05/07/2023 2:21 PM

## 2023-05-07 NOTE — Op Note (Signed)
 [facility] Patient Name: Debra Jacobson Procedure Date: 05/07/2023 2:22 PM MRN: [id number] Endoscopist: Nannette Babe , MD, [id number] Age: 63 Referring MD:  Date of Birth: 07-Jun-1960 Gender: Female Account #: [id number] Procedure:                Upper GI endoscopy Indications:              Iron  deficiency anemia, Gastro-esophageal reflux                            disease with LA Grade C esophagitis at last EGD in                            Aug 2023, on omeprazole  40 mg daily Medicines:                Monitored Anesthesia Care Procedure:                Pre-Anesthesia Assessment:                           - Prior to the procedure, a History and Physical                            was performed, and patient medications and                            allergies were reviewed. The patient's tolerance of                            previous anesthesia was also reviewed. The risks                            and benefits of the procedure and the sedation                            options and risks were discussed with the patient.                            All questions were answered, and informed consent                            was obtained. Prior Anticoagulants: The patient has                            taken no anticoagulant or antiplatelet agents. ASA                            Grade Assessment: II - A patient with mild systemic                            disease. After reviewing the risks and benefits,                            the patient was deemed in satisfactory condition to  undergo the procedure.                           After obtaining informed consent, the endoscope was                            passed under direct vision. Throughout the                            procedure, the patient's blood pressure, pulse, and                            oxygen saturations were monitored continuously. The                            GIF [id number] #[id number]  was introduced through the                            mouth, and advanced to the jejunum. The upper GI                            endoscopy was accomplished without difficulty. The                            patient tolerated the procedure well. Scope In: Scope Out: Findings:                 Patchy, yellow plaques were found in the middle                            third of the esophagus and in the lower third of                            the esophagus.                           A small hiatal hernia was present.                           Evidence of a Roux-en-Y gastrojejunostomy was                            found. The gastrojejunal anastomosis was                            characterized by healthy appearing mucosa.                           The examined jejunum was normal. Complications:            No immediate complications. Estimated Blood Loss:     Estimated blood loss: none. Impression:               - Esophageal plaques were found, consistent with  candidiasis. No evidence of reflux esophagitis.                           - Small hiatal hernia.                           - Roux-en-Y gastrojejunostomy with gastrojejunal                            anastomosis characterized by healthy appearing                            mucosa.                           - Normal examined jejunum.                           - No specimens collected. Recommendation:           - Patient has a contact number available for                            emergencies. The signs and symptoms of potential                            delayed complications were discussed with the                            patient. Return to normal activities tomorrow.                            Written discharge instructions were provided to the                            patient.                           - Resume previous diet.                           - Continue present medications.                            - Await pathology results.                           - See the other procedure note for documentation of                            additional recommendations. Nannette Babe, MD 05/07/2023 3:04:52 PM This report has been signed electronically.

## 2023-05-07 NOTE — Op Note (Signed)
 [facility] Patient Name: Debra Jacobson Procedure Date: 05/07/2023 2:15 PM MRN: [id number] Endoscopist: Nannette Babe , MD, [id number] Age: 63 Referring MD:  Date of Birth: 1960/07/29 Gender: Female Account #: [id number] Procedure:                Colonoscopy Indications:              Last colonoscopy: May 2023 (diverticulosis), Heme                            positive stool, Abnormal CT of the GI tract                            suggesting ascending colon mass, IDA Medicines:                Monitored Anesthesia Care Procedure:                Pre-Anesthesia Assessment:                           - Prior to the procedure, a History and Physical                            was performed, and patient medications and                            allergies were reviewed. The patient's tolerance of                            previous anesthesia was also reviewed. The risks                            and benefits of the procedure and the sedation                            options and risks were discussed with the patient.                            All questions were answered, and informed consent                            was obtained. Prior Anticoagulants: The patient has                            taken no anticoagulant or antiplatelet agents. ASA                            Grade Assessment: II - A patient with mild systemic                            disease. After reviewing the risks and benefits,                            the patient was deemed in satisfactory condition to  undergo the procedure.                           After obtaining informed consent, the colonoscope                            was passed under direct vision. Throughout the                            procedure, the patient's blood pressure, pulse, and                            oxygen saturations were monitored continuously. The                            Olympus Scope SN: [id number] was  introduced through                            the anus and advanced to the terminal ileum. The                            colonoscopy was performed without difficulty. The                            patient tolerated the procedure well. The quality                            of the bowel preparation was adequate and                            irrigation and lavage. The terminal ileum,                            ileocecal valve, appendiceal orifice, and rectum                            were photographed. Scope In: 2:36:03 PM Scope Out: 2:56:54 PM Scope Withdrawal Time: 0 hours 16 minutes 13 seconds  Total Procedure Duration: 0 hours 20 minutes 51 seconds  Findings:                 The digital rectal exam was normal.                           The terminal ileum appeared normal.                           Multiple small-mouthed diverticula were found in                            the descending colon and hepatic flexure.                           Internal hemorrhoids were found during  retroflexion. The hemorrhoids were small.                           The exam was otherwise without abnormality. Complications:            No immediate complications. Estimated Blood Loss:     Estimated blood loss: none. Impression:               - The examined portion of the ileum was normal.                           - Mild diverticulosis in the descending colon and                            at the hepatic flexure.                           - Small internal hemorrhoids.                           - The examination was otherwise normal.                           - No specimens collected. Recommendation:           - Patient has a contact number available for                            emergencies. The signs and symptoms of potential                            delayed complications were discussed with the                            patient. Return to normal activities tomorrow.                             Written discharge instructions were provided to the                            patient.                           - Resume previous diet.                           - Continue present medications.                           - No cause of heme + stools found today. Anemia                            multifactorial and following with Dr. Salomon Cree. If                            persistent IDA or heme + stools video capsule study  could be performed in the future.                           - Repeat colonoscopy in 10 years for screening                            purposes. Nannette Babe, MD 05/07/2023 3:09:38 PM This report has been signed electronically.

## 2023-05-07 NOTE — Progress Notes (Signed)
 Vss nad trans to pacu

## 2023-05-07 NOTE — Progress Notes (Signed)
 Pt's states no medical or surgical changes since previsit or office visit.

## 2023-05-07 NOTE — Patient Instructions (Addendum)
 Resume previous diet Continue present medications Await pathology results  No cause of heme positive stools found today. Anemia multifactorial and following with Dr. Salomon Cree. If persistent IDA or heme+ stools video capsule study could be performed in the future. Repeat colonoscopy in 10 years for screening purposes See handouts for hiatal hernia and hemorrhoids  YOU HAD AN ENDOSCOPIC PROCEDURE TODAY AT THE [facility]:   Refer to the procedure report that was given to you for any specific questions about what was found during the examination.  If the procedure report does not answer your questions, please call your gastroenterologist to clarify.  If you requested that your care partner not be given the details of your procedure findings, then the procedure report has been included in a sealed envelope for you to review at your convenience later.  YOU SHOULD EXPECT: Some feelings of bloating in the abdomen. Passage of more gas than usual.  Walking can help get rid of the air that was put into your GI tract during the procedure and reduce the bloating. If you had a lower endoscopy (such as a colonoscopy or flexible sigmoidoscopy) you may notice spotting of blood in your stool or on the toilet paper. If you underwent a bowel prep for your procedure, you may not have a normal bowel movement for a few days.  Please Note:  You might notice some irritation and congestion in your nose or some drainage.  This is from the oxygen used during your procedure.  There is no need for concern and it should clear up in a day or so.  SYMPTOMS TO REPORT IMMEDIATELY:  Following lower endoscopy (colonoscopy or flexible sigmoidoscopy):  Excessive amounts of blood in the stool  Significant tenderness or worsening of abdominal pains  Swelling of the abdomen that is new, acute  Fever of 100F or higher  Following upper endoscopy (EGD)  Vomiting of blood or coffee ground material  New chest pain or pain under  the shoulder blades  Painful or persistently difficult swallowing  New shortness of breath  Black, tarry-looking stools  For urgent or emergent issues, a gastroenterologist can be reached at any hour by calling [phone]. Do not use MyChart messaging for urgent concerns.   DIET:  We do recommend a small meal at first, but then you may proceed to your regular diet.  Drink plenty of fluids but you should avoid alcoholic beverages for 24 hours.  ACTIVITY:  You should plan to take it easy for the rest of today and you should NOT DRIVE or use heavy machinery until tomorrow (because of the sedation medicines used during the test).    FOLLOW UP: Our staff will call the number listed on your records the next business day following your procedure.  We will call around 7:15- 8:00 am to check on you and address any questions or concerns that you may have regarding the information given to you following your procedure. If we do not reach you, we will leave a message.     If any biopsies were taken you will be contacted by phone or by letter within the next 1-3 weeks.  Please call us  at [phone] if you have not heard about the biopsies in 3 weeks.   SIGNATURES/CONFIDENTIALITY: You and/or your care partner have signed paperwork which will be entered into your electronic medical record.  These signatures attest to the fact that that the information above on your After Visit Summary has been reviewed and is  understood.  Full responsibility of the confidentiality of this discharge information lies with you and/or your care-partner.

## 2023-05-08 ENCOUNTER — Telehealth: Payer: Self-pay

## 2023-05-08 NOTE — Telephone Encounter (Signed)
  Follow up Call-     05/07/2023    1:17 PM 08/28/2021    3:19 PM 05/25/2021    3:09 PM  Call back number  Post procedure Call Back phone  # [phone]  Permission to leave phone message Yes Yes Yes     Patient questions:  Do you have a fever, pain , or abdominal swelling? No. Pain Score  0 *  Have you tolerated food without any problems? Yes.    Have you been able to return to your normal activities? Yes.    Do you have any questions about your discharge instructions: Diet   No. Medications  No. Follow up visit  No.  Do you have questions or concerns about your Care? No.  Actions: * If pain score is 4 or above: No action needed, pain <4.

## 2023-05-15 NOTE — Progress Notes (Signed)
 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 female who has been referred to us  by Dr Leighton Punches for evaluation and management of anemia. The pt reports that she is doing well overall.  The pt reports that she was first given the diagnoses of anemia about 10 years ago. Pt is currently taking 1 iron  pill every other day. Pt had a gastric bypass surgery in 2014. She initially weighed about 400 lbs and is currently 164 lbs. She has been experiencing a severe lack of appetite and weight loss. When she does attempt to eat she can only take a couple of bites and then has a bowel movement soon after that. She also has abdominal pain after she eats. Pt has seasonal allergies and eczema, for which she has been taking Dupixent for 3 months. Pt has never had a skin biopsy to confirm her eczema diagnosis. It was dormant for about 10 years and came back 2 years ago after an interaction with a cat and has persisted. Pt also takes Vistaril  to help with the itch. Pt has never had an allergy test or immune therapy testing. Pt took steroid shots for her carpal tunnel. There were pre-cancerous cells found in pt's pap smear and her Gynecologist is planning on doing a resection. Pt was experiencing black stools a couple of months ago, that were not painful and did not speak with her PCP about it. She had a colonoscopy in 2014 and has never had an endoscopy. Pt believes her ankle swelling is from work and wears compression socks which helps.   Most recent lab results (08/12/2018) of CBC is as follows: WBC at 3.8K, RBC at 2.49, Hgb at 7.8, HCT at 24.3, MCV at 98, MCH at 31.3, MCHC at 32.1, RDW at 15.9, Platelets at 182K.  08/12/2018 Transferrin is  286 08/12/2018 Ferritin is 30  08/12/2018 Iron  and TIBC shows Iron  Bind, Cap, (TIBC) at 349, UIBC at 303, Iron  at 46, Iron  Sat at 13.   On review of systems, pt reports pain in her left knee, weight loss, fatigue, enlarged cervical lymph nodes, black/bloody stools, abdominal pain and denies vaginal bleeds, nose bleeds gum bleeds, chest pain, SOB, fevers, chills and any other symptoms.   On PMHx the pt reports gastric bypass surgery, two cesarean sections, tubal ligation.  On Social Hx the pt reports that she has quit smoking, no alcohol use.   INTERVAL HISTORY:   Debra Jacobson is a 63 y.o. female who is here for continued evaluation and management of smoldering myeloma and multifactorial anemia.  Patient was last seen by me on 04/10/2023 and reported 20-pound weight gain in 1 year, leg pain attributed to long work shifts, and mild leg swelling.   Patient received colonoscopy and upper endoscopy on 05/07/2023 with Dr. Bridgett Camps.   Patient noted to have findings of Candidiasis lower third of esophagus on upper endoscopy. She reports that she was not prescribed antifungal medication.   Patient reports issues with eating and drinking. We discussed that this could be related to reflux issues or yeast infection and there is a need for this to be addressed with antifungal medication.   Patient denies any obvious black stools or blood in the stools since her 2  episodes 1 month ago.   She reports that she is doing well overall.   Patient has not been taking Erythropoietin  injections.   She reports that her energy levels have been good.   She reports recent labs with [facility], not in our system. Iron  panel showed iron  saturation 29%. She did not have ferritin levels checked with those labs.  She reports that she plans to continue following up with PCP once a year.   She denies having any alcohol consumption in 2 years in June.   Patient reports hx of gastric bypass in 2013, and notes  previous weight in 400s prior to the procedure.   She denies any unexpected weight loss. Patient's weight in clinic today is 181 pounds. She reports that she aims to keep her weight below 180 pounds.   She continues to take vitamin B complex.   Patient reports continued knee/leg pain.   She takes Omega-3 fatty acid supplementation.   Patient reports that she stays physically active by walking every morning.  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   MEDICAL HISTORY:  Past Medical History:  Diagnosis Date   Allergy    all rhinitis   Anemia    Dependent edema    Dermatitis    GERD (gastroesophageal reflux disease)    Hypertension     SURGICAL HISTORY: Past Surgical History:  Procedure Laterality Date   CESAREAN SECTION     x2   COLONOSCOPY  05/25/2021   GASTRIC BYPASS  2013   TUBAL LIGATION      SOCIAL HISTORY: Social History   Socioeconomic History   Marital status: Legally Separated    Spouse name: Not on file   Number of children: 3   Years of education: Not on file   Highest education level: Not on file  Occupational History   Occupation: Travel nurse  Tobacco Use   Smoking status: Former    Current packs/day: 0.00    Types: Cigarettes    Quit date: 08/29/1991    Years since quitting: 31.7   Smokeless tobacco: Never  Vaping Use   Vaping status: Never Used  Substance and Sexual Activity   Alcohol use: Not Currently    Comment: Patient drinks 2 40oz beers multiple days a week   Drug use: No   Sexual activity: Not on file  Other Topics Concern   Not on file  Social History Narrative   Not on file   Social Drivers of Health   Financial Resource Strain: Not on file  Food Insecurity: No Food Insecurity (03/01/2023)   Hunger Vital Sign    Worried About Running Out of Food in the Last Year: Never true    Ran Out of Food in the Last Year: Never true  Transportation Needs: Unknown (03/01/2023)   PRAPARE - Transportation     Lack of Transportation (Medical): No    Lack of Transportation (Non-Medical): Patient declined  Physical Activity: Not on file  Stress: Not on file  Social Connections: Unknown (05/11/2021)   Received from [facility]   Social Network    Social Network: Not on file  Intimate Partner Violence: Not At Risk (03/01/2023)   Humiliation, Afraid, Rape, and Kick questionnaire    Fear of Current or Ex-Partner: No    Emotionally Abused: No    Physically Abused: No    Sexually Abused: No    FAMILY HISTORY: Family History  Problem Relation Age of Onset  Hyperlipidemia Mother    Non-Hodgkin's lymphoma Father    Diabetes Maternal Grandmother    Colon cancer Neg Hx    Stomach cancer Neg Hx    Esophageal cancer Neg Hx    Colon polyps Neg Hx    Pancreatic cancer Neg Hx    Breast cancer Neg Hx     ALLERGIES:  has no known allergies.  MEDICATIONS:  Current Outpatient Medications  Medication Sig Dispense Refill   acetaminophen  (TYLENOL ) 500 MG tablet Take 500 mg by mouth every 6 (six) hours as needed.     Cholecalciferol (VITAMIN D3) 125 MCG (5000 UT) TABS Take by mouth.     epoetin  alfa (PROCRIT ) 10000 UNIT/ML injection Inject 1 mL (10,000 Units total) into the skin once a week. 1 mL 0   furosemide (LASIX) 40 MG tablet Take 40 mg by mouth as needed for edema.     gabapentin  (NEURONTIN ) 100 MG capsule TAKE 2 CAPSULES BY MOUTH DAILY IN MORNING AND 4 CAPSULES DAILY AT BEDTIME 180 capsule 2   methocarbamol  (ROBAXIN ) 500 MG tablet Take 1 tablet (500 mg total) by mouth every 8 (eight) hours as needed for muscle spasms. 30 tablet 0   Omega-3 Fatty Acids (FISH OIL OMEGA-3 PO) Take by mouth. Omega xl     omeprazole  (PRILOSEC) 40 MG capsule Take 1 capsule (40 mg total) by mouth daily. 30 capsule 5   predniSONE  (DELTASONE ) 10 MG tablet TAKE 1 TABLET(10 MG) BY MOUTH DAILY WITH BREAKFAST 30 tablet 0   No current facility-administered medications for this visit.    REVIEW OF SYSTEMS:     10 Point review of Systems was done is negative except as noted above.   PHYSICAL EXAMINATION:  .BP 119/83   Pulse (!) 57   Temp (!) 97 F (36.1 C)   Resp 20   Wt 181 lb 6.4 oz (82.3 kg)   LMP 04/14/2012   SpO2 100%   BMI 27.99 kg/m  GENERAL:alert, in no acute distress and comfortable SKIN: no acute rashes, no significant lesions EYES: conjunctiva are pink and non-injected, sclera anicteric OROPHARYNX: MMM, no exudates, no oropharyngeal erythema or ulceration NECK: supple, no JVD LYMPH:  no palpable lymphadenopathy in the cervical, axillary or inguinal regions LUNGS: clear to auscultation b/l with normal respiratory effort HEART: regular rate & rhythm ABDOMEN:  normoactive bowel sounds , non tender, not distended. Extremity: no pedal edema PSYCH: alert & oriented x 3 with fluent speech NEURO: no focal motor/sensory deficits   LABORATORY DATA:  I have reviewed the data as listed  .    Latest Ref Rng & Units 05/17/2023    9:48 AM 04/10/2023    1:31 PM 04/05/2023    7:46 AM  CBC  WBC 4.0 - 10.5 K/uL 8.6  7.3  6.9   Hemoglobin 12.0 - 15.0 g/dL 16.1  9.2  8.7   Hematocrit 36.0 - 46.0 % 37.0  30.3  29.2   Platelets 150 - 400 K/uL 160  228  225    . CBC    Component Value Date/Time   WBC 8.6 05/17/2023 0948   WBC 7.5 03/06/2023 1437   RBC 3.93 05/17/2023 0948   HGB 12.0 05/17/2023 0948   HCT 37.0 05/17/2023 0948   HCT 26.5 (L) 02/13/2021 1258   PLT 160 05/17/2023 0948   MCV 94.1 05/17/2023 0948   MCH 30.5 05/17/2023 0948   MCHC 32.4 05/17/2023 0948   RDW 17.9 (H) 05/17/2023 0948   LYMPHSABS 1.0 05/17/2023 0948  MONOABS 0.3 05/17/2023 0948   EOSABS 0.1 05/17/2023 0948   BASOSABS 0.0 05/17/2023 0948    .    Latest Ref Rng & Units 04/10/2023    1:31 PM 04/05/2023    7:46 AM 03/29/2023    8:00 AM  CMP  Glucose 70 - 99 mg/dL 161  096  99   BUN 8 - 23 mg/dL 24  23  28    Creatinine 0.44 - 1.00 mg/dL 0.45  4.09  8.11   Sodium 135 - 145 mmol/L 137  137  136    Potassium 3.5 - 5.1 mmol/L 3.7  3.8  4.5   Chloride 98 - 111 mmol/L 106  109  105   CO2 22 - 32 mmol/L 27  20  24    Calcium 8.9 - 10.3 mg/dL 9.1  8.7  9.4   Total Protein 6.5 - 8.1 g/dL 7.5  7.5  8.8   Total Bilirubin 0.0 - 1.2 mg/dL 0.2  0.2  0.3   Alkaline Phos 38 - 126 U/L 92  98  114   AST 15 - 41 U/L 28  34  36   ALT 0 - 44 U/L 31  37  40    . Lab Results  Component Value Date   IRON  75 05/17/2023   TIBC 336 05/17/2023   IRONPCTSAT 22 05/17/2023   (Iron  and TIBC)  Lab Results  Component Value Date   FERRITIN 86 05/17/2023   09/22/2018 Bone Marrow Report   09/22/2018 Bone Marrow report    09/22/2018 FISH Analysis    09/22/2018 Cytogenetics  Cytogenetics done 07/28/2021 revealed    Molecular pathology done 07/28/2021 revealed   Bone marrow biopsy done 07/28/2021 revealed "DIAGNOSIS:   BONE MARROW, ASPIRATE, CLOT, CORE:  -Variably cellular bone marrow with plasma cell neoplasm  -See comment   PERIPHERAL BLOOD:  - Macrocytic anemia   COMMENT:   The bone marrow is generally normocellular for age with increased number  of plasma cells representing 11% of all cells in the aspirate with lack  of large aggregates or sheets in the clot/biopsy sections.  The plasma  cells display kappa light chain restriction consistent with plasma cell  neoplasm.  The background shows trilineage hematopoiesis with generally  nonspecific changes possibly related to history of liver disease,  alcohol consumption, etc.  Nonetheless, correlation with cytogenetic and  FISH studies is recommended."  RADIOGRAPHIC STUDIES: I have personally reviewed the radiological images as listed and agreed with the findings in the report. No results found.  05/07/2023 Colonoscopy:    05/06/2024 Upper endoscopy:    ASSESSMENT & PLAN:   63 y.o. female with:  1) significant macrocytic anemia likely multifactorial but significant concern for liver cirrhosis and alcohol abuse.  Other  factors Vitamin B12 deficiency and other potential nutritional deficiencyes caused by Gastric bypass surgery . Also had dyserythropoiesis on BM Bx.  Currently active significant alcohol abuse and liver disease are also significant contributing factors. 2) Iron  deficiency- GI blood loss vs poor absorption related to Gastric bypass surgery. 09/17/2018 Occult blood card to lab is "POSITIVE" x2.  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  Recent admission with confusion and hepatic encephalopathy. 6) noncompliance with medical follow-up 7) Leg swelling -Down with diuresis. -US  results not suggestive of DVT  PLAN:  -Discussed lab results on 05/17/2023 in detail with patient. CBC showed WBC of 8.6K, hemoglobin of 12.0, and platelets of 17.9K. -hgb improved from being in 7s in March to 12 currently -myeloma lab from March is stable -patient's endoscopy and colonoscopy with Dr. Bridgett Camps earlier this month did not show any signs of tumor -there were no findings of colonic masses -No findings of acitve bleeding or varices -Discussed that findings of Heme positive stools can be from hemorrhoids -there were findings of Candidiasis in the lower third of esophagus on upper endoscopy -advised patient to connect with Dr. Bridgett Camps to address esophageal candidiasis and let us  know if we need to order antifungal medication -CT scan from February showed No metastatic disease identified within the abdomen or  pelvis.  -patient not taking EPO injections at this time -discussed that Gastric bypass can cause iron  deficiency issues over time from absorption issues -discussed ferritin goal of 200-250  -patient is agreeable to receiving one session of IV iron  to ensure she has enough iron  stores then stopping and monitoring -proceed with IV iron  today -iron  labs from today show ferritin of 86 and iron  saturation of  22% -if iron  levels are normal, we shall plan for repeat myeloma and iron  labs in 3-4 months -discussed that if there is still concern for bleeding, there would be consideration of capsule study  -Patient noted to be jehovah's witness and she will not receive PRBC transfusion.  -will plan for phone visit in 3-4 months  FOLLOW-UP: Labs in 12 weeks Phone visit with Dr Salomon Cree in 14 weeks  The total time spent in the appointment was 30 minutes* .  All of the patient's questions were answered with apparent satisfaction. The patient knows to call the clinic with any problems, questions or concerns.   Jacquelyn Matt MD MS AAHIVMS [facility] Hematology/Oncology Physician [facility]  .*Total Encounter Time as defined by the Centers for Medicare and Medicaid Services includes, in addition to the face-to-face time of a patient visit (documented in the note above) non-face-to-face time: obtaining and reviewing outside history, ordering and reviewing medications, tests or procedures, care coordination (communications with other health care professionals or caregivers) and documentation in the medical record.    I,Mitra Faeizi,acting as a Neurosurgeon for Jacquelyn Matt, MD.,have documented all relevant documentation on the behalf of Jacquelyn Matt, MD,as directed by  Jacquelyn Matt, MD while in the presence of Jacquelyn Matt, MD.  .I have reviewed the above documentation for accuracy and completeness, and I agree with the above. .Vernor Monnig Kishore Bryanda Mikel MD

## 2023-05-16 ENCOUNTER — Other Ambulatory Visit: Payer: Self-pay | Admitting: Orthopedic Surgery

## 2023-05-16 ENCOUNTER — Other Ambulatory Visit: Payer: Self-pay | Admitting: Nurse Practitioner

## 2023-05-16 ENCOUNTER — Other Ambulatory Visit: Payer: Self-pay

## 2023-05-16 DIAGNOSIS — D508 Other iron deficiency anemias: Secondary | ICD-10-CM

## 2023-05-17 ENCOUNTER — Telehealth: Payer: Self-pay | Admitting: Internal Medicine

## 2023-05-17 ENCOUNTER — Inpatient Hospital Stay

## 2023-05-17 ENCOUNTER — Inpatient Hospital Stay (HOSPITAL_BASED_OUTPATIENT_CLINIC_OR_DEPARTMENT_OTHER): Admitting: Hematology

## 2023-05-17 ENCOUNTER — Other Ambulatory Visit: Payer: Self-pay | Admitting: *Deleted

## 2023-05-17 ENCOUNTER — Inpatient Hospital Stay: Attending: Hematology

## 2023-05-17 VITALS — BP 116/75 | HR 61 | Temp 97.9°F | Resp 18

## 2023-05-17 VITALS — BP 119/83 | HR 57 | Temp 97.0°F | Resp 20 | Wt 181.4 lb

## 2023-05-17 DIAGNOSIS — D508 Other iron deficiency anemias: Secondary | ICD-10-CM

## 2023-05-17 DIAGNOSIS — D472 Monoclonal gammopathy: Secondary | ICD-10-CM | POA: Insufficient documentation

## 2023-05-17 DIAGNOSIS — D509 Iron deficiency anemia, unspecified: Secondary | ICD-10-CM | POA: Insufficient documentation

## 2023-05-17 DIAGNOSIS — Z87891 Personal history of nicotine dependence: Secondary | ICD-10-CM | POA: Insufficient documentation

## 2023-05-17 DIAGNOSIS — Z9884 Bariatric surgery status: Secondary | ICD-10-CM

## 2023-05-17 DIAGNOSIS — E538 Deficiency of other specified B group vitamins: Secondary | ICD-10-CM | POA: Diagnosis present

## 2023-05-17 DIAGNOSIS — Z79899 Other long term (current) drug therapy: Secondary | ICD-10-CM | POA: Diagnosis not present

## 2023-05-17 DIAGNOSIS — K7460 Unspecified cirrhosis of liver: Secondary | ICD-10-CM

## 2023-05-17 LAB — IRON AND IRON BINDING CAPACITY (CC-WL,HP ONLY)
Iron: 75 ug/dL (ref 28–170)
Saturation Ratios: 22 % (ref 10.4–31.8)
TIBC: 336 ug/dL (ref 250–450)
UIBC: 261 ug/dL (ref 148–442)

## 2023-05-17 LAB — FERRITIN: Ferritin: 86 ng/mL (ref 11–307)

## 2023-05-17 LAB — CBC WITH DIFFERENTIAL (CANCER CENTER ONLY)
Abs Immature Granulocytes: 0.03 10*3/uL (ref 0.00–0.07)
Basophils Absolute: 0 10*3/uL (ref 0.0–0.1)
Basophils Relative: 0 %
Eosinophils Absolute: 0.1 10*3/uL (ref 0.0–0.5)
Eosinophils Relative: 1 %
HCT: 37 % (ref 36.0–46.0)
Hemoglobin: 12 g/dL (ref 12.0–15.0)
Immature Granulocytes: 0 %
Lymphocytes Relative: 12 %
Lymphs Abs: 1 10*3/uL (ref 0.7–4.0)
MCH: 30.5 pg (ref 26.0–34.0)
MCHC: 32.4 g/dL (ref 30.0–36.0)
MCV: 94.1 fL (ref 80.0–100.0)
Monocytes Absolute: 0.3 10*3/uL (ref 0.1–1.0)
Monocytes Relative: 3 %
Neutro Abs: 7.2 10*3/uL (ref 1.7–7.7)
Neutrophils Relative %: 84 %
Platelet Count: 160 10*3/uL (ref 150–400)
RBC: 3.93 MIL/uL (ref 3.87–5.11)
RDW: 17.9 % — ABNORMAL HIGH (ref 11.5–15.5)
WBC Count: 8.6 10*3/uL (ref 4.0–10.5)
nRBC: 0 % (ref 0.0–0.2)

## 2023-05-17 MED ORDER — SODIUM CHLORIDE 0.9 % IV SOLN
300.0000 mg | Freq: Once | INTRAVENOUS | Status: AC
  Administered 2023-05-17: 300.0065 mg via INTRAVENOUS
  Filled 2023-05-17: qty 300

## 2023-05-17 MED ORDER — LORATADINE 10 MG PO TABS: 10.0000 mg | ORAL_TABLET | Freq: Once | ORAL | Status: DC

## 2023-05-17 MED ORDER — FLUCONAZOLE 100 MG PO TABS: ORAL_TABLET | ORAL | 0 refills | Status: DC

## 2023-05-17 MED ORDER — ACETAMINOPHEN 325 MG PO TABS: 650.0000 mg | ORAL_TABLET | Freq: Once | ORAL | Status: DC

## 2023-05-17 NOTE — Telephone Encounter (Signed)
 Dr Bridgett Camps,   Please advise....  Candida noted in esophagus at 05/07/23 visit but no specimens were collected.

## 2023-05-17 NOTE — Telephone Encounter (Signed)
 Spoke to patient to advise that rx has been sent for fluconazole x 14 days to her pharmacy. Advised that due to her liver disease, she does need labwork completed next Friday to check her liver while taking this medication. She verbalizes understanding and agrees to come for this test. Order placed in EPIC.

## 2023-05-17 NOTE — Telephone Encounter (Signed)
 Inbound call from patient, states she has some candida after her procedure with Dr. Bridgett Camps, would like diflucan called in to relieve symptoms.

## 2023-05-17 NOTE — Patient Instructions (Signed)

## 2023-05-17 NOTE — Progress Notes (Signed)
 Pt declined to stay for 30 min observation post venofer  infusion. Tolerated well. No adverse s/s. VSS. No hx adverse s/s. Pt ambulated independently to lobby for d/c. AVS declined.

## 2023-05-17 NOTE — Telephone Encounter (Signed)
 Dottie, I reviewed EGD procedure report which showed plaques consistent with esophageal candidiasis. Biopsies were not obtained. Ok to send in RX for Diflucan (Fluconazole) 100mg  take two tabs po day # 1 then one tab po every day x 13 days. # 15 tabs no refills. Patient with history of cirrhosis. Pls send patient to lab in 1 week to check hepatic panel. THX.

## 2023-05-20 ENCOUNTER — Inpatient Hospital Stay

## 2023-05-20 LAB — KAPPA/LAMBDA LIGHT CHAINS
Kappa free light chain: 40.7 mg/L — ABNORMAL HIGH (ref 3.3–19.4)
Kappa, lambda light chain ratio: 2.64 — ABNORMAL HIGH (ref 0.26–1.65)
Lambda free light chains: 15.4 mg/L (ref 5.7–26.3)

## 2023-05-21 LAB — MULTIPLE MYELOMA PANEL, SERUM
Albumin SerPl Elph-Mcnc: 3.6 g/dL (ref 2.9–4.4)
Albumin/Glob SerPl: 0.9 (ref 0.7–1.7)
Alpha 1: 0.2 g/dL (ref 0.0–0.4)
Alpha2 Glob SerPl Elph-Mcnc: 0.9 g/dL (ref 0.4–1.0)
B-Globulin SerPl Elph-Mcnc: 0.9 g/dL (ref 0.7–1.3)
Gamma Glob SerPl Elph-Mcnc: 2.1 g/dL — ABNORMAL HIGH (ref 0.4–1.8)
Globulin, Total: 4.1 g/dL — ABNORMAL HIGH (ref 2.2–3.9)
IgA: 27 mg/dL — ABNORMAL LOW (ref 87–352)
IgG (Immunoglobin G), Serum: 1916 mg/dL — ABNORMAL HIGH (ref 586–1602)
IgM (Immunoglobulin M), Srm: 414 mg/dL — ABNORMAL HIGH (ref 26–217)
M Protein SerPl Elph-Mcnc: 1.2 g/dL — ABNORMAL HIGH
Total Protein ELP: 7.7 g/dL (ref 6.0–8.5)

## 2023-05-23 ENCOUNTER — Encounter: Payer: Self-pay | Admitting: Hematology

## 2023-05-31 ENCOUNTER — Other Ambulatory Visit: Payer: Self-pay | Admitting: Orthopedic Surgery

## 2023-06-07 ENCOUNTER — Ambulatory Visit

## 2023-06-10 ENCOUNTER — Inpatient Hospital Stay

## 2023-06-30 ENCOUNTER — Other Ambulatory Visit: Payer: Self-pay | Admitting: Orthopedic Surgery

## 2023-06-30 ENCOUNTER — Other Ambulatory Visit: Payer: Self-pay | Admitting: Hematology

## 2023-06-30 DIAGNOSIS — D472 Monoclonal gammopathy: Secondary | ICD-10-CM

## 2023-07-01 ENCOUNTER — Encounter: Payer: Self-pay | Admitting: Hematology

## 2023-07-01 NOTE — Telephone Encounter (Signed)
 Gabapentin  refill sent. Patient needs to connect with PCP regarding ongoing gabapentin  management for neuropathy. Thx GK

## 2023-07-12 ENCOUNTER — Ambulatory Visit

## 2023-07-15 ENCOUNTER — Inpatient Hospital Stay

## 2023-08-05 ENCOUNTER — Telehealth: Payer: Self-pay | Admitting: Orthopedic Surgery

## 2023-08-05 ENCOUNTER — Other Ambulatory Visit: Payer: Self-pay | Admitting: Orthopedic Surgery

## 2023-08-05 ENCOUNTER — Encounter: Payer: Self-pay | Admitting: Hematology

## 2023-08-05 MED ORDER — METHOCARBAMOL 500 MG PO TABS: 500.0000 mg | ORAL_TABLET | Freq: Three times a day (TID) | ORAL | 0 refills | Status: DC | PRN

## 2023-08-05 MED ORDER — PREDNISONE 10 MG PO TABS: 10.0000 mg | ORAL_TABLET | Freq: Every day | ORAL | 0 refills | Status: DC

## 2023-08-05 NOTE — Telephone Encounter (Signed)
 Patient called. She would like Robaxin  and prednisone  called in for her.

## 2023-08-05 NOTE — Progress Notes (Unsigned)
She

## 2023-08-09 ENCOUNTER — Telehealth: Payer: Self-pay | Admitting: Hematology

## 2023-08-09 ENCOUNTER — Inpatient Hospital Stay: Payer: Self-pay | Attending: Hematology

## 2023-08-09 ENCOUNTER — Ambulatory Visit

## 2023-08-09 DIAGNOSIS — D472 Monoclonal gammopathy: Secondary | ICD-10-CM | POA: Diagnosis present

## 2023-08-09 DIAGNOSIS — E538 Deficiency of other specified B group vitamins: Secondary | ICD-10-CM | POA: Diagnosis present

## 2023-08-09 DIAGNOSIS — D509 Iron deficiency anemia, unspecified: Secondary | ICD-10-CM | POA: Diagnosis present

## 2023-08-09 LAB — CBC WITH DIFFERENTIAL (CANCER CENTER ONLY)
Abs Immature Granulocytes: 0.03 K/uL (ref 0.00–0.07)
Basophils Absolute: 0 K/uL (ref 0.0–0.1)
Basophils Relative: 0 %
Eosinophils Absolute: 0 K/uL (ref 0.0–0.5)
Eosinophils Relative: 0 %
HCT: 34.6 % — ABNORMAL LOW (ref 36.0–46.0)
Hemoglobin: 11.3 g/dL — ABNORMAL LOW (ref 12.0–15.0)
Immature Granulocytes: 0 %
Lymphocytes Relative: 22 %
Lymphs Abs: 1.7 K/uL (ref 0.7–4.0)
MCH: 32 pg (ref 26.0–34.0)
MCHC: 32.7 g/dL (ref 30.0–36.0)
MCV: 98 fL (ref 80.0–100.0)
Monocytes Absolute: 0.5 K/uL (ref 0.1–1.0)
Monocytes Relative: 7 %
Neutro Abs: 5.4 K/uL (ref 1.7–7.7)
Neutrophils Relative %: 71 %
Platelet Count: 191 K/uL (ref 150–400)
RBC: 3.53 MIL/uL — ABNORMAL LOW (ref 3.87–5.11)
RDW: 15 % (ref 11.5–15.5)
WBC Count: 7.7 K/uL (ref 4.0–10.5)
nRBC: 0 % (ref 0.0–0.2)

## 2023-08-12 ENCOUNTER — Inpatient Hospital Stay: Payer: Self-pay

## 2023-08-21 ENCOUNTER — Inpatient Hospital Stay: Payer: Self-pay | Admitting: Hematology

## 2023-08-28 ENCOUNTER — Encounter: Payer: Self-pay | Admitting: Hematology

## 2023-08-28 NOTE — Progress Notes (Signed)
 This encounter was created in error - please disregard.  Did not have all her planned labs for her phone visit.  She will be rescheduled out 1 to 2 months with labs

## 2023-09-02 ENCOUNTER — Other Ambulatory Visit: Payer: Self-pay | Admitting: Orthopedic Surgery

## 2023-09-03 ENCOUNTER — Other Ambulatory Visit: Payer: Self-pay | Admitting: Orthopedic Surgery

## 2023-09-25 ENCOUNTER — Other Ambulatory Visit: Payer: Self-pay | Admitting: Family

## 2023-09-25 ENCOUNTER — Other Ambulatory Visit: Payer: Self-pay | Admitting: Orthopedic Surgery

## 2023-10-10 ENCOUNTER — Other Ambulatory Visit: Payer: Self-pay

## 2023-10-10 DIAGNOSIS — D472 Monoclonal gammopathy: Secondary | ICD-10-CM

## 2023-10-10 DIAGNOSIS — D508 Other iron deficiency anemias: Secondary | ICD-10-CM

## 2023-10-11 ENCOUNTER — Inpatient Hospital Stay: Payer: Self-pay | Attending: Hematology

## 2023-10-11 DIAGNOSIS — D509 Iron deficiency anemia, unspecified: Secondary | ICD-10-CM | POA: Insufficient documentation

## 2023-10-11 DIAGNOSIS — Z807 Family history of other malignant neoplasms of lymphoid, hematopoietic and related tissues: Secondary | ICD-10-CM | POA: Insufficient documentation

## 2023-10-11 DIAGNOSIS — Z79899 Other long term (current) drug therapy: Secondary | ICD-10-CM | POA: Insufficient documentation

## 2023-10-11 DIAGNOSIS — Z9884 Bariatric surgery status: Secondary | ICD-10-CM | POA: Diagnosis not present

## 2023-10-11 DIAGNOSIS — E538 Deficiency of other specified B group vitamins: Secondary | ICD-10-CM | POA: Diagnosis present

## 2023-10-11 DIAGNOSIS — Z87891 Personal history of nicotine dependence: Secondary | ICD-10-CM | POA: Diagnosis not present

## 2023-10-11 DIAGNOSIS — D472 Monoclonal gammopathy: Secondary | ICD-10-CM | POA: Insufficient documentation

## 2023-10-11 DIAGNOSIS — D508 Other iron deficiency anemias: Secondary | ICD-10-CM

## 2023-10-11 LAB — CBC WITH DIFFERENTIAL (CANCER CENTER ONLY)
Abs Immature Granulocytes: 0.02 K/uL (ref 0.00–0.07)
Basophils Absolute: 0 K/uL (ref 0.0–0.1)
Basophils Relative: 0 %
Eosinophils Absolute: 0 K/uL (ref 0.0–0.5)
Eosinophils Relative: 0 %
HCT: 38.2 % (ref 36.0–46.0)
Hemoglobin: 12.4 g/dL (ref 12.0–15.0)
Immature Granulocytes: 0 %
Lymphocytes Relative: 15 %
Lymphs Abs: 1 K/uL (ref 0.7–4.0)
MCH: 32.5 pg (ref 26.0–34.0)
MCHC: 32.5 g/dL (ref 30.0–36.0)
MCV: 100 fL (ref 80.0–100.0)
Monocytes Absolute: 0.3 K/uL (ref 0.1–1.0)
Monocytes Relative: 4 %
Neutro Abs: 5.4 K/uL (ref 1.7–7.7)
Neutrophils Relative %: 81 %
Platelet Count: 162 K/uL (ref 150–400)
RBC: 3.82 MIL/uL — ABNORMAL LOW (ref 3.87–5.11)
RDW: 13.8 % (ref 11.5–15.5)
WBC Count: 6.7 K/uL (ref 4.0–10.5)
nRBC: 0 % (ref 0.0–0.2)

## 2023-10-11 LAB — CMP (CANCER CENTER ONLY)
ALT: 59 U/L — ABNORMAL HIGH (ref 0–44)
AST: 48 U/L — ABNORMAL HIGH (ref 15–41)
Albumin: 4.3 g/dL (ref 3.5–5.0)
Alkaline Phosphatase: 63 U/L (ref 38–126)
Anion gap: 6 (ref 5–15)
BUN: 24 mg/dL — ABNORMAL HIGH (ref 8–23)
CO2: 26 mmol/L (ref 22–32)
Calcium: 10.1 mg/dL (ref 8.9–10.3)
Chloride: 106 mmol/L (ref 98–111)
Creatinine: 1.04 mg/dL — ABNORMAL HIGH (ref 0.44–1.00)
GFR, Estimated: >60 mL/min (ref >60)
Glucose, Bld: 108 mg/dL — ABNORMAL HIGH (ref 70–99)
Potassium: 4.2 mmol/L (ref 3.5–5.1)
Sodium: 138 mmol/L (ref 135–145)
Total Bilirubin: 0.4 mg/dL (ref 0.0–1.2)
Total Protein: 8 g/dL (ref 6.5–8.1)

## 2023-10-11 LAB — IRON AND IRON BINDING CAPACITY (CC-WL,HP ONLY)
Iron: 74 ug/dL (ref 28–170)
Saturation Ratios: 22 % (ref 10.4–31.8)
TIBC: 340 ug/dL (ref 250–450)
UIBC: 266 ug/dL (ref 148–442)

## 2023-10-11 LAB — FERRITIN: Ferritin: 79 ng/mL (ref 11–307)

## 2023-10-14 LAB — KAPPA/LAMBDA LIGHT CHAINS
Kappa free light chain: 32 mg/L — ABNORMAL HIGH (ref 3.3–19.4)
Kappa, lambda light chain ratio: 2.19 — ABNORMAL HIGH (ref 0.26–1.65)
Lambda free light chains: 14.6 mg/L (ref 5.7–26.3)

## 2023-10-15 LAB — MULTIPLE MYELOMA PANEL, SERUM
Albumin SerPl Elph-Mcnc: 3.8 g/dL (ref 2.9–4.4)
Albumin/Glob SerPl: 1.1 (ref 0.7–1.7)
Alpha 1: 0.3 g/dL (ref 0.0–0.4)
Alpha2 Glob SerPl Elph-Mcnc: 0.8 g/dL (ref 0.4–1.0)
B-Globulin SerPl Elph-Mcnc: 0.8 g/dL (ref 0.7–1.3)
Gamma Glob SerPl Elph-Mcnc: 1.8 g/dL (ref 0.4–1.8)
Globulin, Total: 3.8 g/dL (ref 2.2–3.9)
IgA: 23 mg/dL — ABNORMAL LOW (ref 87–352)
IgG (Immunoglobin G), Serum: 1808 mg/dL — ABNORMAL HIGH (ref 586–1602)
IgM (Immunoglobulin M), Srm: 396 mg/dL — ABNORMAL HIGH (ref 26–217)
M Protein SerPl Elph-Mcnc: 1 g/dL — ABNORMAL HIGH
Total Protein ELP: 7.6 g/dL (ref 6.0–8.5)

## 2023-10-21 ENCOUNTER — Inpatient Hospital Stay: Payer: Self-pay | Admitting: Hematology

## 2023-10-21 DIAGNOSIS — D472 Monoclonal gammopathy: Secondary | ICD-10-CM | POA: Diagnosis not present

## 2023-10-21 DIAGNOSIS — D508 Other iron deficiency anemias: Secondary | ICD-10-CM

## 2023-10-21 DIAGNOSIS — R222 Localized swelling, mass and lump, trunk: Secondary | ICD-10-CM | POA: Diagnosis not present

## 2023-10-21 NOTE — Progress Notes (Signed)
 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 female who has been referred to us  by Dr Debra Jacobson for evaluation and management of anemia. The pt reports that she is doing well overall.  The pt reports that she was first given the diagnoses of anemia about 10 years ago. Pt is currently taking 1 iron  pill every other day. Pt had a gastric bypass surgery in 2014. She initially weighed about 400 lbs and is currently 164 lbs. She has been experiencing a severe lack of appetite and weight loss. When she does attempt to eat she can only take a couple of bites and then has a bowel movement soon after that. She also has abdominal pain after she eats. Pt has seasonal allergies and eczema, for which she has been taking Dupixent for 3 months. Pt has never had a skin biopsy to confirm her eczema diagnosis. It was dormant for about 10 years and came back 2 years ago after an interaction with a cat and has persisted. Pt also takes Vistaril  to help with the itch. Pt has never had an allergy test or immune therapy testing. Pt took steroid shots for her carpal tunnel. There were pre-cancerous cells found in pt's pap smear and her Gynecologist is planning on doing a resection. Pt was experiencing black stools a couple of months ago, that were not painful and did not speak with her PCP about it. She had a colonoscopy in 2014 and has never had an endoscopy. Pt believes her ankle swelling is from work and wears compression socks which helps.   Most recent lab results (08/12/2018) of CBC is as follows: WBC at 3.8K, RBC at 2.49, Hgb at 7.8, HCT at 24.3, MCV at 98, MCH at 31.3, MCHC at 32.1, RDW at 15.9, Platelets at 182K.  08/12/2018 Transferrin is  286 08/12/2018 Ferritin is 30  08/12/2018 Iron  and TIBC shows Iron  Bind, Cap, (TIBC) at 349, UIBC at 303, Iron  at 46, Iron  Sat at 13.   On review of systems, pt reports pain in her left knee, weight loss, fatigue, enlarged cervical lymph nodes, black/bloody stools, abdominal pain and denies vaginal bleeds, nose bleeds gum bleeds, chest pain, SOB, fevers, chills and any other symptoms.   On PMHx the pt reports gastric bypass surgery, two cesarean sections, tubal ligation.  On Social Hx the pt reports that she has quit smoking, no alcohol use.   INTERVAL HISTORY:  I connected with Debra Jacobson on 10/21/2023 at  3:30 PM EDT by telephone visit and verified that I am speaking with the correct person using two identifiers.  Patient was last seen by me on 08/21/2023, noting she had undergone a colonoscopy and upper endoscopy on 05/07/2023 with Dr. 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  I discussed the limitations, risks, security and privacy concerns of performing an evaluation and management service by telemedicine and the availability of in-person appointments. I also discussed with the patient that there may be a patient responsible charge related to this service. The patient expressed understanding and agreed to proceed.   Other persons participating in the visit and their role in the encounter: Medical Scribe, Debra Jacobson  Patient's location: Work  Provider's location: [facility]   Chief Complaint: continued evaluation and management of smoldering myeloma and multifactorial anemia.   She reports that she has recently been drinking a lot of energy drinks, and has not consumed EToH for 2 years now, but does admit that she has not been drinking much water. Says that she has stopped eatiing lunch meat due to salt content causing some leg swelling about 4 months ago.   States that she is still taking Robaxin   Gabapentin  Tylenol  2 regular strength BID as well as Omega-3 and Vitamin B complex.   Notes that she has been experiencing posterior thigh pain for which she has been receiving injections for. She reports that she is still following with Dr. Harden.   An additional thing she notes is having a palpable subcutaneous knot right of her sternum, described as measuring about 4 cm, firm, with irregular edges, which she noticed about 4-6 months ago. Denies pain/ She did recently undergo a colonoscopy.  MEDICAL HISTORY:  Past Medical History:  Diagnosis Date   Allergy    all rhinitis   Anemia    Dependent edema    Dermatitis    GERD (gastroesophageal reflux disease)    Hypertension     SURGICAL HISTORY: Past Surgical History:  Procedure Laterality Date   CESAREAN SECTION     x2   COLONOSCOPY  05/25/2021   GASTRIC BYPASS  2013   TUBAL LIGATION      SOCIAL HISTORY: Social History   Socioeconomic History   Marital status: Legally Separated    Spouse name: Not on file   Number of children: 3   Years of education: Not on file   Highest education level: Not on file  Occupational History   Occupation: Travel nurse  Tobacco Use   Smoking status: Former    Current packs/day: 0.00    Types: Cigarettes    Quit date: 08/29/1991    Years since quitting: 32.1   Smokeless tobacco: Never  Vaping Use   Vaping status: Never Used  Substance and Sexual Activity   Alcohol use: Not Currently    Comment: Patient drinks 2 40oz beers multiple days a week   Drug use: No   Sexual activity: Not on file  Other Topics Concern   Not on file  Social History Narrative   Not on file   Social Drivers of Health   Financial Resource Strain: Not on file  Food Insecurity: No Food Insecurity (03/01/2023)   Hunger Vital Sign    Worried About Running Out of Food in the Last Year: Never true    Ran Out of Food in the Last Year: Never true  Transportation Needs: Unknown (03/01/2023)   PRAPARE -  Transportation    Lack of Transportation (Medical): No    Lack of Transportation (Non-Medical): Patient declined  Physical Activity: Not on file  Stress: Not on file  Social Connections: Unknown (05/11/2021)   [facility]   Social Network    Social Network: Not on file  Intimate Partner Violence: Not At Risk (03/01/2023)   Humiliation, Afraid, Rape, and Kick questionnaire    Fear of Current or Ex-Partner: No    Emotionally Abused: No    Physically Abused: No    Sexually Abused: No    FAMILY HISTORY: Family History  Problem Relation Age of Onset   Hyperlipidemia Mother    Non-Hodgkin's lymphoma Father    Diabetes Maternal Grandmother    Colon cancer Neg  Hx    Stomach cancer Neg Hx    Esophageal cancer Neg Hx    Colon polyps Neg Hx    Pancreatic cancer Neg Hx    Breast cancer Neg Hx     ALLERGIES:  has no known allergies.  MEDICATIONS:  Current Outpatient Medications  Medication Sig Dispense Refill   acetaminophen  (TYLENOL ) 500 MG tablet Take 500 mg by mouth every 6 (six) hours as needed.     Cholecalciferol (VITAMIN D3) 125 MCG (5000 UT) TABS Take by mouth.     epoetin  alfa (PROCRIT ) 10000 UNIT/ML injection Inject 1 mL (10,000 Units total) into the skin once a week. 1 mL 0   fluconazole  (DIFLUCAN ) 100 MG tablet Take 2 tablets (200 mg) by mouth once on day 1, then decrease to 1 tablet (100 mg) by mouth once daily days 2-14. 15 tablet 0   furosemide (LASIX) 40 MG tablet Take 40 mg by mouth as needed for edema.     gabapentin  (NEURONTIN ) 100 MG capsule TAKE 2 CAPSULES BY MOUTH DAILY IN MORNING AND 4 CAPSULES DAILY AT BEDTIME 180 capsule 2   methocarbamol  (ROBAXIN ) 500 MG tablet TAKE 1 TABLET BY MOUTH EVERY 8 HOURS AS NEEDED FOR MUSCLE SPASM 30 tablet 0   Omega-3 Fatty Acids (FISH OIL OMEGA-3 PO) Take by mouth. Omega xl     omeprazole  (PRILOSEC) 40 MG capsule Take 1 capsule (40 mg total) by mouth daily. 30 capsule 5   predniSONE  (DELTASONE ) 10 MG tablet TAKE 1  TABLET (10 MG TOTAL) BY MOUTH DAILY WITH BREAKFAST. 30 tablet 0   No current facility-administered medications for this visit.    REVIEW OF SYSTEMS:    10 Point review of Systems was done is negative except as noted above.   PHYSICAL EXAMINATION TELEPHONE VISIT:  .LMP 04/14/2012   GENERAL: sounds alert, in no acute distress and comfortable PSYCH: sounds alert & oriented x 3 with fluent speech  LABORATORY DATA:  I have reviewed the data as listed  .    Latest Ref Rng & Units 10/11/2023    1:09 PM 08/09/2023   12:58 PM 05/17/2023    9:48 AM  CBC EXTENDED  WBC 4.0 - 10.5 K/uL 6.7  7.7  8.6   RBC 3.87 - 5.11 MIL/uL 3.82  3.53  3.93   Hemoglobin 12.0 - 15.0 g/dL 87.5  88.6  87.9   HCT 36.0 - 46.0 % 38.2  34.6  37.0   Platelets 150 - 400 K/uL 162  191  160   NEUT# 1.7 - 7.7 K/uL 5.4  5.4  7.2   Lymph# 0.7 - 4.0 K/uL 1.0  1.7  1.0     CBC    Component Value Date/Time   WBC 6.7 10/11/2023 1309   WBC 7.5 03/06/2023 1437   RBC 3.82 (L) 10/11/2023 1309   HGB 12.4 10/11/2023 1309   HCT 38.2 10/11/2023 1309   HCT 26.5 (L) 02/13/2021 1258   PLT 162 10/11/2023 1309   MCV 100.0 10/11/2023 1309   MCH 32.5 10/11/2023 1309   MCHC 32.5 10/11/2023 1309   RDW 13.8 10/11/2023 1309   LYMPHSABS 1.0 10/11/2023 1309   MONOABS 0.3 10/11/2023 1309   EOSABS 0.0 10/11/2023 1309   BASOSABS 0.0 10/11/2023 1309        Latest Ref Rng & Units 10/11/2023    1:09 PM 04/10/2023    1:31 PM 04/05/2023    7:46 AM  CMP  Glucose 70 - 99 mg/dL 891  893  135   BUN 8 - 23 mg/dL 24  24  23    Creatinine 0.44 - 1.00 mg/dL 8.95  8.79  8.99   Sodium 135 - 145 mmol/L 138  137  137   Potassium 3.5 - 5.1 mmol/L 4.2  3.7  3.8   Chloride 98 - 111 mmol/L 106  106  109   CO2 22 - 32 mmol/L 26  27  20    Calcium 8.9 - 10.3 mg/dL 89.8  9.1  8.7   Total Protein 6.5 - 8.1 g/dL 8.0  7.5  7.5   Total Bilirubin 0.0 - 1.2 mg/dL 0.4  0.2  0.2   Alkaline Phos 38 - 126 U/L 63  92  98   AST 15 - 41 U/L 48  28  34   ALT 0 -  44 U/L 59  31  37    . Lab Results  Component Value Date   IRON  74 10/11/2023   TIBC 340 10/11/2023   IRONPCTSAT 22 10/11/2023   (Iron  and TIBC)  Lab Results  Component Value Date   FERRITIN 79 10/11/2023    Immunofixation shows IgG monoclonal protein with kappa light chain  specificity.  PLEASE NOTE:  Samples from patients receiving DARZALEX(R) (daratumumab) or  SARCLISA(R)(isatuximab-irfc) treatment can appear as an  IgG kappa and mask a complete response (CR). If this patient  is receiving these therapies, this IFE assay interference  can be removed by ordering test number 123218-Immunofixation,  Daratumumab-Specific, Serum or 123062-Immunofixation,  Isatuximab-Specific, Serum and submitting a new sample for  testing or by calling the lab to add this test to the current  sample.  Polyclonal increase detected in one or more immunoglobulins.     09/22/2018 Bone Marrow Report   09/22/2018 Bone Marrow report    09/22/2018 FISH Analysis    09/22/2018 Cytogenetics  Cytogenetics done 07/28/2021 revealed    Molecular pathology done 07/28/2021 revealed   Bone marrow biopsy done 07/28/2021 revealed DIAGNOSIS:   BONE MARROW, ASPIRATE, CLOT, CORE:  -Variably cellular bone marrow with plasma cell neoplasm  -See comment   PERIPHERAL BLOOD:  - Macrocytic anemia   COMMENT:   The bone marrow is generally normocellular for age with increased number  of plasma cells representing 11% of all cells in the aspirate with lack  of large aggregates or sheets in the clot/biopsy sections.  The plasma  cells display kappa light chain restriction consistent with plasma cell  neoplasm.  The background shows trilineage hematopoiesis with generally  nonspecific changes possibly related to history of liver disease,  alcohol consumption, etc.  Nonetheless, correlation with cytogenetic and  FISH studies is recommended.  RADIOGRAPHIC STUDIES: I have personally reviewed the  radiological images as listed and agreed with the findings in the report. No results found.  05/07/2023 Colonoscopy:    05/07/2023 Upper endoscopy:    ASSESSMENT & PLAN:   63 y.o. female with:  1) significant macrocytic anemia likely multifactorial but significant concern for liver cirrhosis and alcohol abuse.  Other factors Vitamin B12 deficiency and other potential nutritional deficiencyes caused by Gastric bypass surgery . Also had dyserythropoiesis on BM Bx.  Currently active significant alcohol abuse and liver disease are also significant contributing factors. 2) Iron  deficiency- GI blood loss vs poor absorption related to Gastric bypass surgery. 09/17/2018 Occult blood card to lab is POSITIVE x2.  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  Recent admission with confusion and hepatic encephalopathy. 6) noncompliance with medical follow-up 7) Leg swelling -Down with diuresis. -US  results not suggestive of DVT  PLAN:  -Discussed lab results on 10/11/2023 in detail with patient:  CBC showed WBC of 6.7K, Hemoglobin of 12.4, and PLTs of 162K. CMP with Creatinine 1.04, AST 48 elevated from 28, and ALT 59, elevated from 31. Multiple Myeloma labs stable. Myeloma panel shows stable M spuke of 1g/dl of IgG Kappa SFLC - improved Kappa FLC down to 32 with K/L ratio of 2.19 Ferritin 79 with iron  saturation of 22% No evidence of active bleeding at this time. No indication for  additional IV Iron  at this time. Would recommend iron  rich foods and if tolerated PO OTC Iron  polysaccharide 150mg  po Three time a week.  - Patient's endoscopy and colonoscopy with Dr. Albertus 05/2023 did not show any signs of tumor There were no findings of colonic masses No findings of acitve bleeding or varices   FOLLOW-UP: Labs in 14 weeks RTC with Dr Onesimo in 16 weeks  .The total time spent in the appointment was 21 minutes* .  All of the patient's questions were answered with apparent satisfaction. The patient knows to call the clinic with any problems, questions or concerns.   Emaline Onesimo MD MS AAHIVMS [facility] Hematology/Oncology Physician [facility]  .*Total Encounter Time as defined by the Centers for Medicare and Medicaid Services includes, in addition to the face-to-face time of a patient visit (documented in the note above) non-face-to-face time: obtaining and reviewing outside history, ordering and reviewing medications, tests or procedures, care coordination (communications with other health care professionals or caregivers) and documentation in the medical record.  I,  Damien Jacobson,acting as a scribe for Emaline Onesimo, MD.,have documented all relevant documentation on the behalf of Emaline Onesimo, MD,as directed by  Emaline Onesimo, MD while in the presence of Emaline Onesimo, MD.  I have reviewed the above documentation for accuracy and completeness, and I agree with the above. Emaline Candida Onesimo MD.

## 2023-10-22 ENCOUNTER — Other Ambulatory Visit: Payer: Self-pay | Admitting: Family

## 2023-10-24 ENCOUNTER — Other Ambulatory Visit: Payer: Self-pay | Admitting: Family

## 2023-10-24 ENCOUNTER — Ambulatory Visit (HOSPITAL_COMMUNITY)
Admission: RE | Admit: 2023-10-24 | Discharge: 2023-10-24 | Disposition: A | Discharge status: Home / Self Care | Source: Ambulatory Visit | Attending: Hematology | Admitting: Hematology

## 2023-10-24 ENCOUNTER — Other Ambulatory Visit: Payer: Self-pay | Admitting: Hematology

## 2023-10-24 DIAGNOSIS — R222 Localized swelling, mass and lump, trunk: Secondary | ICD-10-CM

## 2023-10-27 ENCOUNTER — Encounter: Payer: Self-pay | Admitting: Hematology

## 2023-10-29 ENCOUNTER — Encounter: Payer: Self-pay | Admitting: Internal Medicine

## 2023-10-29 DIAGNOSIS — Z1231 Encounter for screening mammogram for malignant neoplasm of breast: Secondary | ICD-10-CM

## 2023-11-04 ENCOUNTER — Encounter: Payer: Self-pay | Admitting: Radiology

## 2023-11-06 ENCOUNTER — Other Ambulatory Visit: Payer: Self-pay | Admitting: Hematology

## 2023-11-06 DIAGNOSIS — D472 Monoclonal gammopathy: Secondary | ICD-10-CM

## 2023-11-07 ENCOUNTER — Other Ambulatory Visit: Payer: Self-pay | Admitting: Internal Medicine

## 2023-11-07 DIAGNOSIS — Z1231 Encounter for screening mammogram for malignant neoplasm of breast: Secondary | ICD-10-CM

## 2023-11-19 ENCOUNTER — Other Ambulatory Visit: Payer: Self-pay | Admitting: Family

## 2023-11-20 ENCOUNTER — Other Ambulatory Visit: Payer: Self-pay | Admitting: Family

## 2023-11-26 ENCOUNTER — Other Ambulatory Visit: Payer: Self-pay | Admitting: Internal Medicine

## 2023-11-26 DIAGNOSIS — K2970 Gastritis, unspecified, without bleeding: Secondary | ICD-10-CM

## 2023-12-14 ENCOUNTER — Other Ambulatory Visit: Payer: Self-pay | Admitting: Family

## 2023-12-18 ENCOUNTER — Other Ambulatory Visit: Payer: Self-pay | Admitting: Family

## 2023-12-30 ENCOUNTER — Encounter: Admitting: Obstetrics and Gynecology

## 2023-12-30 ENCOUNTER — Ambulatory Visit

## 2024-01-15 ENCOUNTER — Other Ambulatory Visit: Payer: Self-pay | Admitting: Family

## 2024-01-29 ENCOUNTER — Other Ambulatory Visit: Payer: Self-pay

## 2024-01-29 DIAGNOSIS — D472 Monoclonal gammopathy: Secondary | ICD-10-CM

## 2024-01-29 DIAGNOSIS — D508 Other iron deficiency anemias: Secondary | ICD-10-CM

## 2024-01-30 ENCOUNTER — Other Ambulatory Visit: Payer: Self-pay | Admitting: Family

## 2024-01-30 ENCOUNTER — Inpatient Hospital Stay: Attending: Hematology

## 2024-01-31 ENCOUNTER — Inpatient Hospital Stay

## 2024-02-07 ENCOUNTER — Encounter: Payer: Self-pay | Admitting: Hematology

## 2024-02-07 ENCOUNTER — Inpatient Hospital Stay: Attending: Hematology

## 2024-02-07 DIAGNOSIS — D472 Monoclonal gammopathy: Secondary | ICD-10-CM

## 2024-02-07 DIAGNOSIS — D508 Other iron deficiency anemias: Secondary | ICD-10-CM

## 2024-02-07 LAB — CBC WITH DIFFERENTIAL (CANCER CENTER ONLY)
Abs Immature Granulocytes: 0.03 10*3/uL (ref 0.00–0.07)
Basophils Absolute: 0 10*3/uL (ref 0.0–0.1)
Basophils Relative: 0 %
Eosinophils Absolute: 0 10*3/uL (ref 0.0–0.5)
Eosinophils Relative: 0 %
HCT: 37.2 % (ref 36.0–46.0)
Hemoglobin: 12.1 g/dL (ref 12.0–15.0)
Immature Granulocytes: 0 %
Lymphocytes Relative: 17 %
Lymphs Abs: 1.5 10*3/uL (ref 0.7–4.0)
MCH: 32.7 pg (ref 26.0–34.0)
MCHC: 32.5 g/dL (ref 30.0–36.0)
MCV: 100.5 fL — ABNORMAL HIGH (ref 80.0–100.0)
Monocytes Absolute: 0.4 10*3/uL (ref 0.1–1.0)
Monocytes Relative: 5 %
Neutro Abs: 7 10*3/uL (ref 1.7–7.7)
Neutrophils Relative %: 78 %
Platelet Count: 178 10*3/uL (ref 150–400)
RBC: 3.7 MIL/uL — ABNORMAL LOW (ref 3.87–5.11)
RDW: 13 % (ref 11.5–15.5)
WBC Count: 9 10*3/uL (ref 4.0–10.5)
nRBC: 0 % (ref 0.0–0.2)

## 2024-02-07 LAB — IRON AND IRON BINDING CAPACITY (CC-WL,HP ONLY)
Iron: 85 ug/dL (ref 28–170)
Saturation Ratios: 25 % (ref 10.4–31.8)
TIBC: 336 ug/dL (ref 250–450)
UIBC: 251 ug/dL

## 2024-02-07 LAB — CMP (CANCER CENTER ONLY)
ALT: 65 U/L — ABNORMAL HIGH (ref 0–44)
AST: 51 U/L — ABNORMAL HIGH (ref 15–41)
Albumin: 4.2 g/dL (ref 3.5–5.0)
Alkaline Phosphatase: 77 U/L (ref 38–126)
Anion gap: 11 (ref 5–15)
BUN: 17 mg/dL (ref 8–23)
CO2: 23 mmol/L (ref 22–32)
Calcium: 9.4 mg/dL (ref 8.9–10.3)
Chloride: 104 mmol/L (ref 98–111)
Creatinine: 0.95 mg/dL (ref 0.44–1.00)
GFR, Estimated: >60 mL/min
Glucose, Bld: 100 mg/dL — ABNORMAL HIGH (ref 70–99)
Potassium: 4.6 mmol/L (ref 3.5–5.1)
Sodium: 138 mmol/L (ref 135–145)
Total Bilirubin: 0.3 mg/dL (ref 0.0–1.2)
Total Protein: 7.9 g/dL (ref 6.5–8.1)

## 2024-02-07 LAB — FERRITIN: Ferritin: 117 ng/mL (ref 11–307)

## 2024-02-14 ENCOUNTER — Inpatient Hospital Stay: Admitting: Hematology
